# Patient Record
Sex: Female | Born: 1970 | Race: White | Hispanic: No | Smoking: Former smoker
Health system: Southern US, Community
[De-identification: ages and names within clinical notes are randomized; demographics above are authoritative.]

## PROBLEM LIST (undated history)

## (undated) DIAGNOSIS — G8929 Other chronic pain: Secondary | ICD-10-CM

## (undated) DIAGNOSIS — M1990 Unspecified osteoarthritis, unspecified site: Secondary | ICD-10-CM

## (undated) DIAGNOSIS — K259 Gastric ulcer, unspecified as acute or chronic, without hemorrhage or perforation: Secondary | ICD-10-CM

## (undated) DIAGNOSIS — I480 Paroxysmal atrial fibrillation: Secondary | ICD-10-CM

## (undated) DIAGNOSIS — D649 Anemia, unspecified: Secondary | ICD-10-CM

## (undated) DIAGNOSIS — M549 Dorsalgia, unspecified: Secondary | ICD-10-CM

## (undated) HISTORY — DX: Anemia, unspecified: D64.9

## (undated) HISTORY — PX: TUBAL LIGATION: SHX77

## (undated) HISTORY — DX: Paroxysmal atrial fibrillation: I48.0

## (undated) HISTORY — PX: REPLACEMENT TOTAL KNEE BILATERAL: SUR1225

## (undated) HISTORY — PX: CHOLECYSTECTOMY: SHX55

---

## 2003-12-23 ENCOUNTER — Emergency Department (HOSPITAL_COMMUNITY): Admission: EM | Admit: 2003-12-23 | Discharge: 2003-12-23 | Payer: Self-pay | Admitting: Emergency Medicine

## 2003-12-23 ENCOUNTER — Inpatient Hospital Stay (HOSPITAL_COMMUNITY): Admission: EM | Admit: 2003-12-23 | Discharge: 2003-12-25 | Payer: Self-pay | Admitting: Psychiatry

## 2005-10-01 ENCOUNTER — Ambulatory Visit: Payer: Self-pay | Admitting: Family Medicine

## 2005-10-17 ENCOUNTER — Emergency Department (HOSPITAL_COMMUNITY): Admission: EM | Admit: 2005-10-17 | Discharge: 2005-10-17 | Payer: Self-pay | Admitting: Emergency Medicine

## 2005-10-29 ENCOUNTER — Ambulatory Visit: Payer: Self-pay | Admitting: Family Medicine

## 2006-06-04 ENCOUNTER — Ambulatory Visit: Payer: Self-pay | Admitting: Family Medicine

## 2006-07-14 ENCOUNTER — Inpatient Hospital Stay (HOSPITAL_COMMUNITY): Admission: RE | Admit: 2006-07-14 | Discharge: 2006-07-17 | Payer: Self-pay | Admitting: Orthopedic Surgery

## 2006-08-18 ENCOUNTER — Encounter: Admission: RE | Admit: 2006-08-18 | Discharge: 2006-08-18 | Payer: Self-pay | Admitting: Physician Assistant

## 2006-10-17 ENCOUNTER — Ambulatory Visit: Payer: Self-pay | Admitting: Family Medicine

## 2006-11-05 ENCOUNTER — Emergency Department (HOSPITAL_COMMUNITY): Admission: EM | Admit: 2006-11-05 | Discharge: 2006-11-05 | Payer: Self-pay | Admitting: Emergency Medicine

## 2006-11-24 ENCOUNTER — Emergency Department (HOSPITAL_COMMUNITY): Admission: EM | Admit: 2006-11-24 | Discharge: 2006-11-24 | Payer: Self-pay | Admitting: Emergency Medicine

## 2006-11-27 ENCOUNTER — Ambulatory Visit: Payer: Self-pay | Admitting: Family Medicine

## 2006-12-15 ENCOUNTER — Inpatient Hospital Stay (HOSPITAL_COMMUNITY): Admission: RE | Admit: 2006-12-15 | Discharge: 2006-12-18 | Payer: Self-pay | Admitting: Orthopedic Surgery

## 2006-12-17 ENCOUNTER — Ambulatory Visit: Payer: Self-pay | Admitting: Vascular Surgery

## 2006-12-17 ENCOUNTER — Encounter: Payer: Self-pay | Admitting: Vascular Surgery

## 2007-01-16 ENCOUNTER — Ambulatory Visit: Payer: Self-pay | Admitting: Family Medicine

## 2007-02-13 ENCOUNTER — Ambulatory Visit: Payer: Self-pay | Admitting: Family Medicine

## 2007-02-21 ENCOUNTER — Emergency Department (HOSPITAL_COMMUNITY): Admission: EM | Admit: 2007-02-21 | Discharge: 2007-02-21 | Payer: Self-pay | Admitting: Emergency Medicine

## 2007-03-17 ENCOUNTER — Emergency Department (HOSPITAL_COMMUNITY): Admission: EM | Admit: 2007-03-17 | Discharge: 2007-03-17 | Payer: Self-pay | Admitting: Emergency Medicine

## 2007-03-20 ENCOUNTER — Emergency Department (HOSPITAL_COMMUNITY): Admission: EM | Admit: 2007-03-20 | Discharge: 2007-03-20 | Payer: Self-pay | Admitting: Emergency Medicine

## 2007-03-29 ENCOUNTER — Emergency Department (HOSPITAL_COMMUNITY): Admission: EM | Admit: 2007-03-29 | Discharge: 2007-03-29 | Payer: Self-pay | Admitting: Emergency Medicine

## 2007-07-16 ENCOUNTER — Ambulatory Visit (HOSPITAL_COMMUNITY): Admission: RE | Admit: 2007-07-16 | Discharge: 2007-07-16 | Payer: Self-pay | Admitting: Orthopaedic Surgery

## 2008-01-21 ENCOUNTER — Inpatient Hospital Stay (HOSPITAL_COMMUNITY): Admission: EM | Admit: 2008-01-21 | Discharge: 2008-01-24 | Payer: Self-pay | Admitting: Emergency Medicine

## 2008-02-28 ENCOUNTER — Emergency Department (HOSPITAL_COMMUNITY): Admission: EM | Admit: 2008-02-28 | Discharge: 2008-02-28 | Payer: Self-pay | Admitting: Emergency Medicine

## 2008-11-14 IMAGING — CR DG KNEE COMPLETE 4+V*R*
4 series · 4 of 4 positions shown · non-contrast
Comparison: 11/05/2006

CLINICAL DATA: Status post fall

RIGHT KNEE - COMPLETE 4+ VIEW

[view not recorded (1 of 4)]
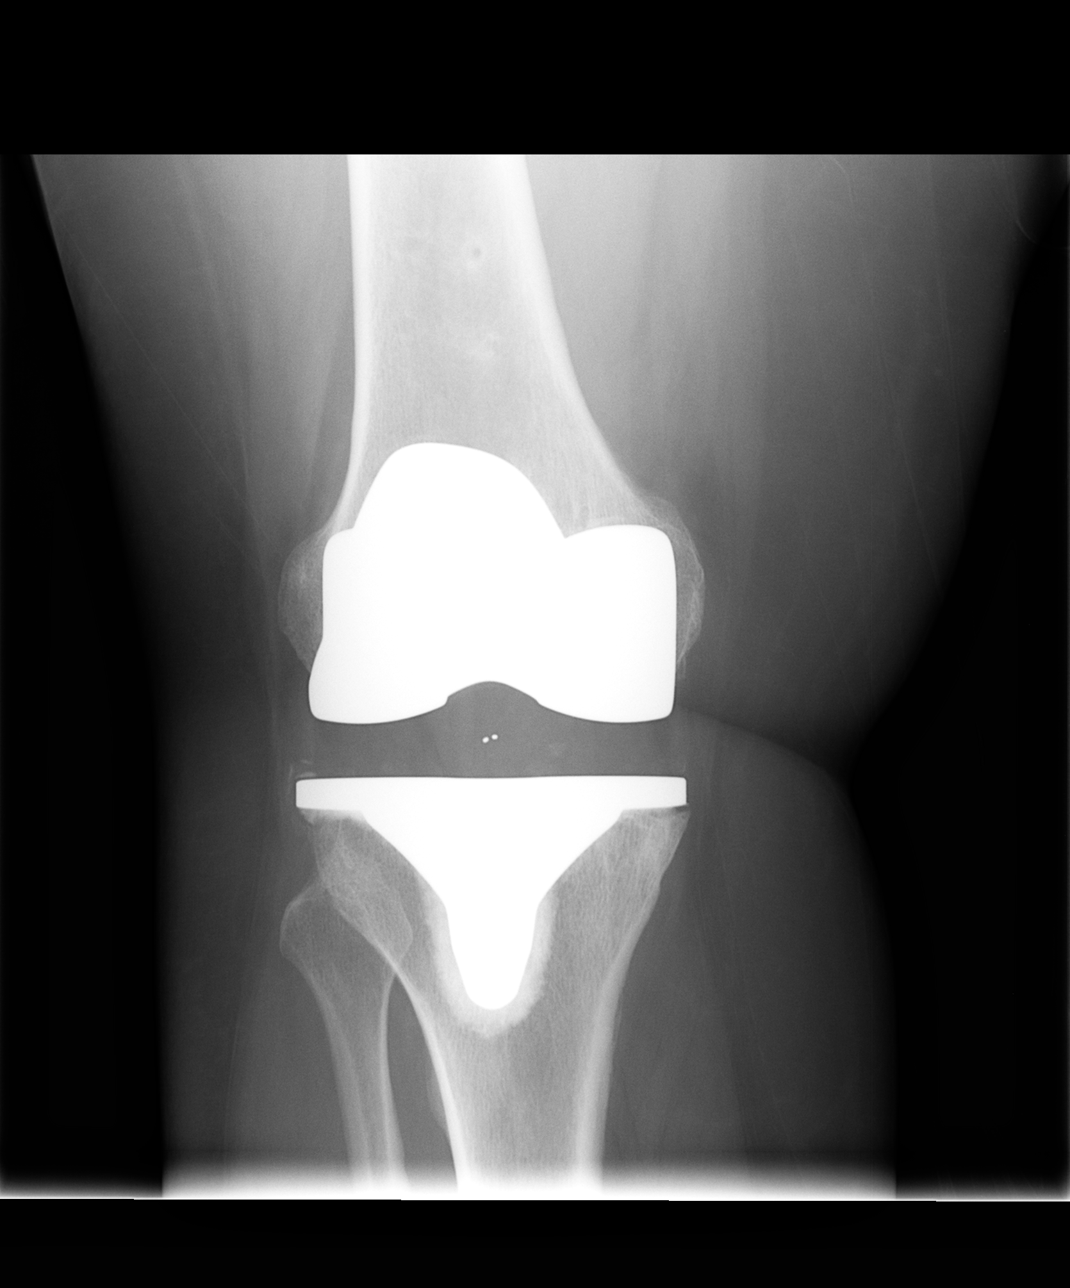

[view not recorded (2 of 4)]
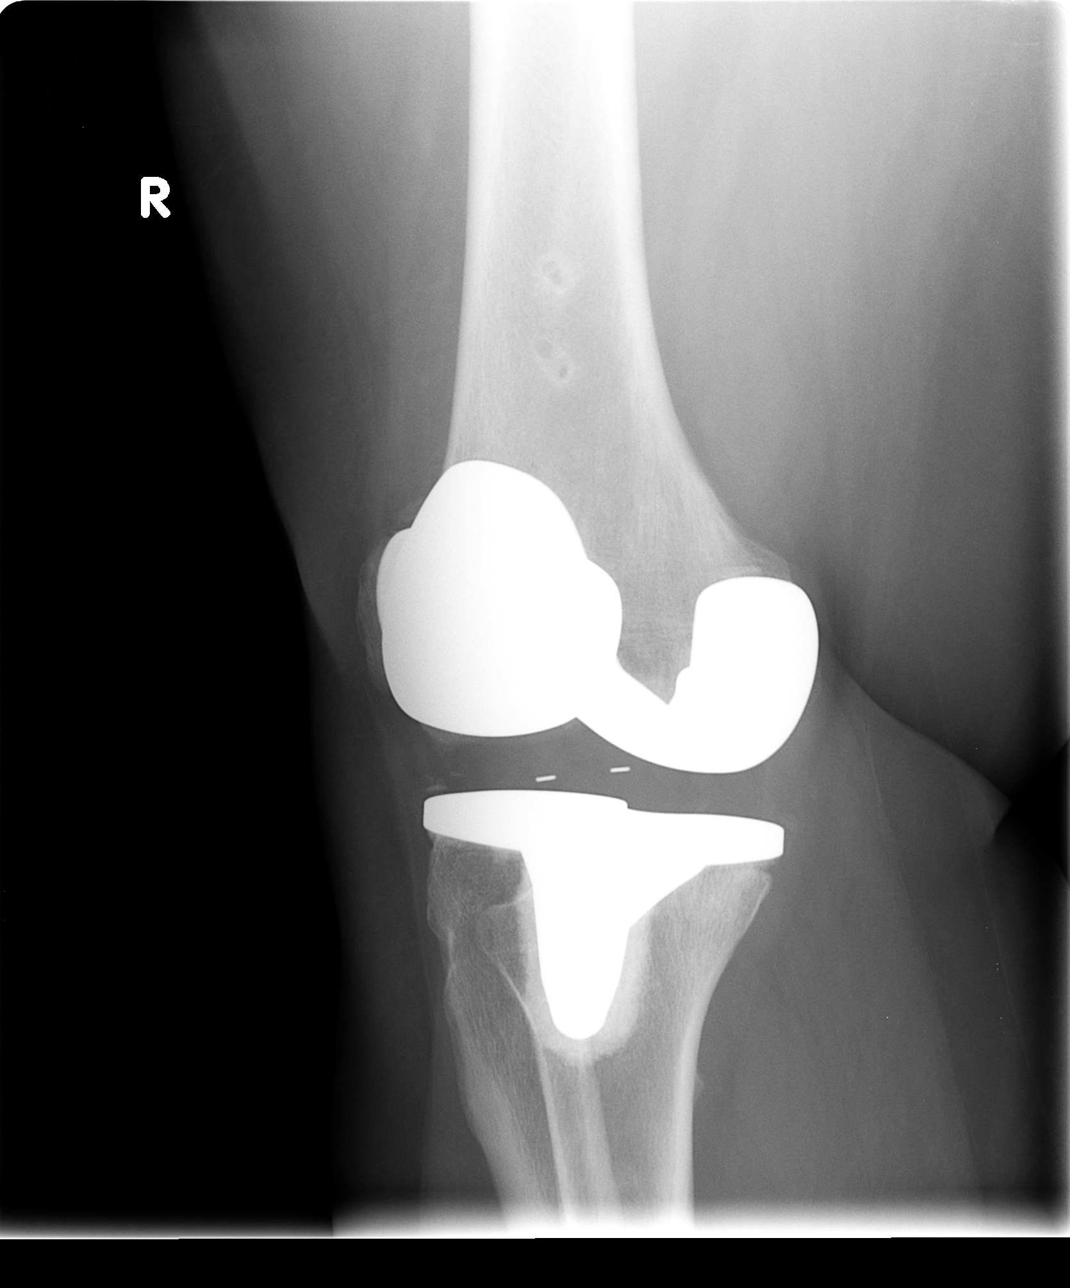

[view not recorded (3 of 4)]
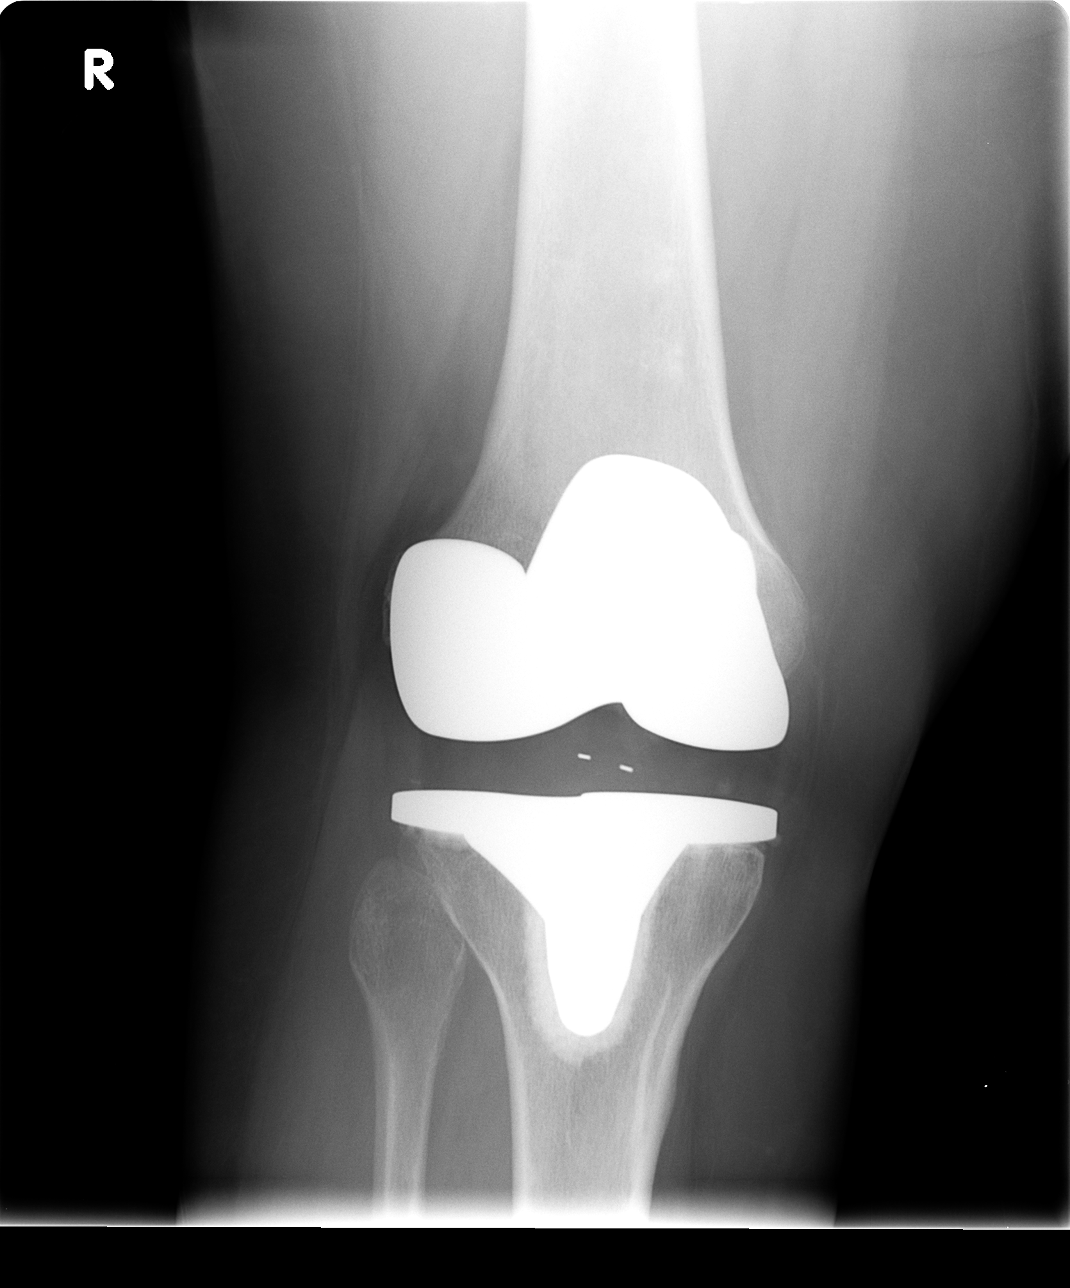

[view not recorded (4 of 4)]
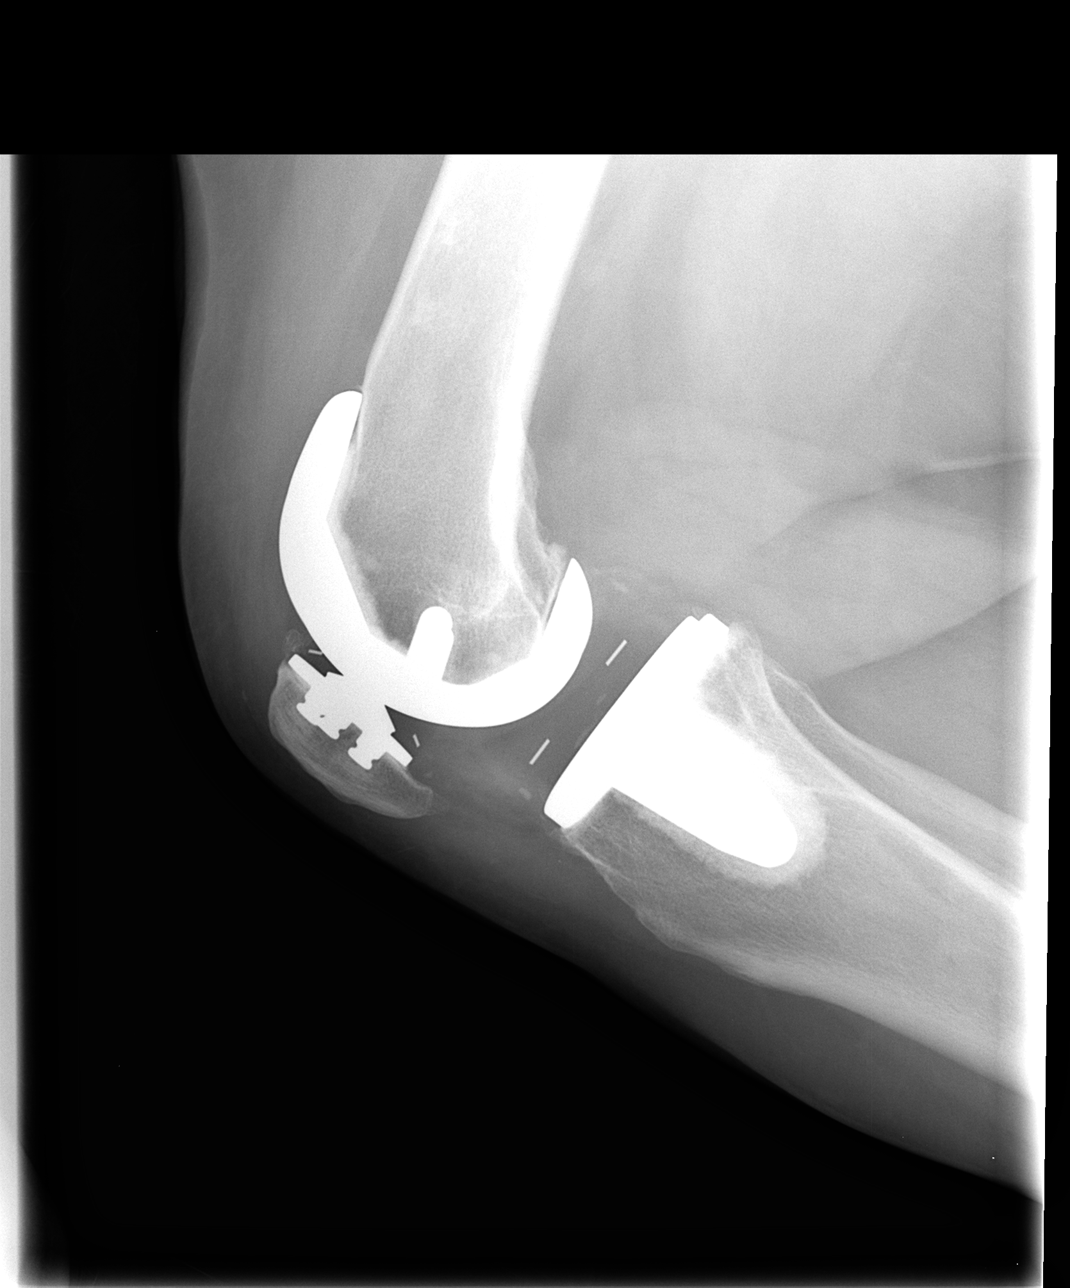

[4 of 4 positions shown; findings below may reference images not displayed]

FINDINGS: Patient has undergone previous total knee replacement.
The prosthesis is stable.  There is no evidence for fracture.  No
joint effusion is identified.
IMPRESSION: Stable.

## 2009-04-26 ENCOUNTER — Emergency Department (HOSPITAL_COMMUNITY): Admission: EM | Admit: 2009-04-26 | Discharge: 2009-04-26 | Payer: Self-pay | Admitting: Emergency Medicine

## 2009-06-28 ENCOUNTER — Emergency Department (HOSPITAL_COMMUNITY): Admission: EM | Admit: 2009-06-28 | Discharge: 2009-06-28 | Payer: Self-pay | Admitting: Emergency Medicine

## 2009-07-31 ENCOUNTER — Emergency Department (HOSPITAL_COMMUNITY): Admission: EM | Admit: 2009-07-31 | Discharge: 2009-07-31 | Payer: Self-pay | Admitting: Emergency Medicine

## 2009-10-04 ENCOUNTER — Emergency Department (HOSPITAL_COMMUNITY): Admission: EM | Admit: 2009-10-04 | Discharge: 2009-10-04 | Payer: Self-pay | Admitting: Emergency Medicine

## 2010-01-31 ENCOUNTER — Emergency Department (HOSPITAL_COMMUNITY): Admission: EM | Admit: 2010-01-31 | Discharge: 2010-01-31 | Payer: Self-pay | Admitting: Emergency Medicine

## 2010-05-12 ENCOUNTER — Emergency Department (HOSPITAL_COMMUNITY): Admission: EM | Admit: 2010-05-12 | Discharge: 2010-05-13 | Payer: Self-pay | Admitting: Emergency Medicine

## 2010-09-20 ENCOUNTER — Emergency Department (HOSPITAL_COMMUNITY)
Admission: EM | Admit: 2010-09-20 | Discharge: 2010-09-20 | Payer: Self-pay | Source: Home / Self Care | Admitting: Emergency Medicine

## 2010-12-08 LAB — DIFFERENTIAL
Basophils Absolute: 0.1 10*3/uL (ref 0.0–0.1)
Lymphocytes Relative: 29 % (ref 12–46)
Lymphs Abs: 2.1 10*3/uL (ref 0.7–4.0)
Monocytes Relative: 4 % (ref 3–12)
Neutrophils Relative %: 65 % (ref 43–77)

## 2010-12-08 LAB — PREGNANCY, URINE: Preg Test, Ur: NEGATIVE

## 2010-12-08 LAB — COMPREHENSIVE METABOLIC PANEL
ALT: 20 U/L (ref 0–35)
Albumin: 3.4 g/dL — ABNORMAL LOW (ref 3.5–5.2)
Alkaline Phosphatase: 81 U/L (ref 39–117)
Calcium: 9.3 mg/dL (ref 8.4–10.5)
Chloride: 108 mEq/L (ref 96–112)
Creatinine, Ser: 0.74 mg/dL (ref 0.4–1.2)

## 2010-12-08 LAB — CBC
MCHC: 33.8 g/dL (ref 30.0–36.0)
MCV: 89.9 fL (ref 78.0–100.0)
RBC: 3.57 MIL/uL — ABNORMAL LOW (ref 3.87–5.11)
RDW: 16.7 % — ABNORMAL HIGH (ref 11.5–15.5)

## 2010-12-08 LAB — URINALYSIS, ROUTINE W REFLEX MICROSCOPIC
Bilirubin Urine: NEGATIVE
Hgb urine dipstick: NEGATIVE
Ketones, ur: NEGATIVE mg/dL
Specific Gravity, Urine: 1.02 (ref 1.005–1.030)
Urobilinogen, UA: 0.2 mg/dL (ref 0.0–1.0)

## 2011-01-15 NOTE — H&P (Signed)
NAME:  MILTON, STREICHER NO.:  [id number]   MEDICAL RECORD NO.:  [id number]          PATIENT TYPE:  EMS   LOCATION:  ED                            FACILITY:  [facility]   PHYSICIAN:  Osvaldo Shipper, MD     DATE OF BIRTH:  1971/07/21   DATE OF ADMISSION:  01/21/2008  DATE OF DISCHARGE:  LH                              HISTORY & PHYSICAL   PRIMARY MEDICAL DOCTOR:  Dr. Loney Hering, [facility], [place].   ADMISSION DIAGNOSES:  1. Acute pyelonephritis right side.  2. Obesity.  3. History of depression.  4. History of arthritis.   CHIEF COMPLAINT:  Right-sided flank pain.   HISTORY OF PRESENT ILLNESS:  The patient is a 40 year old Caucasian  female who has no significant past medical history, who was in a usual  state of health till about five  days ago when she started experiencing  pain in the right flank area.  The pain was 5/10 in intensity.  These  symptoms progressively worsened and today she started experiencing  severe pain which was 10/10 intensity.  The pain started radiating  towards the groin.  She denies any history of kidney stones.  Along with  these symptoms, she also started having chills and sweating.  She also  had nausea and vomiting.  No diarrhea.  She had dysuria and yesterday  she had a few episodes of urinary incontinence.  She has had what she  says was a kidney infection about 3-4 years ago but does not have these  issues on consistent basis.   MEDICATIONS AT HOME:  She is on  1. Lexapro 10 mg daily.  2. Voltaren 75 mg twice a day.  3. Vicodin 7.5/750 as needed.   ALLERGIES:  1. DARVOCET.  2. PENICILLIN.  3. TORADOL.   PAST MEDICAL HISTORY:  Positive for  1. Arthritis.  2. Bilateral total knee replacements.  3. Cholecystectomy.  4. Back pain.   SOCIAL HISTORY:  [place].  Not working at this time.  Smokes half pack of cigarettes on a daily  basis.  Denies any alcohol use or illicit drug use.   FAMILY HISTORY:  Noncontributory.   REVIEW OF SYSTEMS:  GENERAL:  System positive for weakness.  HEENT:  Unremarkable.  CARDIOVASCULAR:  Unremarkable.  RESPIRATORY:  Unremarkable.  GI: As in HPI.  GU: As in HPI.  NEUROLOGIC:  Unremarkable.  PSYCHIATRIC:  Unremarkable.  Other systems unremarkable.   PHYSICAL EXAMINATION:  VITAL SIGNS:  When she presented, her temperature  was 102.3, improving to 100.7; blood pressure was 95/57 with heart rate  of 104, improving to 101/33 with heart rate of 80; respiratory rate 22,  saturation 98% on room air.  GENERAL:  This is an obese white female who appears sick but in no  distress.  HEENT: There is no pallor, no icterus.  Mucous membranes slightly dry.  No oral lesions are noted.  NECK:  Soft and supple.  No thyromegaly is appreciated.  LUNGS:  Clear to auscultation bilaterally.  No wheezing, rales or  rhonchi.  CARDIOVASCULAR:  S1 and S2 normal regular.  ABDOMEN:  Soft.  There is right CVA tenderness, pain also in the lower  abdomen area on the right side.  No rebound, rigidity or guarding is  present.  EXTREMITIES:  No edema.  Her pulses are palpable.  NEUROLOGIC:  She is alert and oriented x3.  No focal neurological  deficits are present.   LABORATORY DATA:  Hemoglobin 8.5, which is pretty close to her baseline,  MCV 82, platelet count 219 and white count is 7.9 with neutrophil 84%.  Her urine pregnancy was negative.  Her UA was positive for nitrite,  leukocytes, innumerable wbc, many bacteria, small blood.  She also had a  BMET which is not reported yet.   No imaging studies have been done.   ASSESSMENT:  This is a 40 year old Caucasian female who is obese,  presents with four-day history of right flank pain.  She has evidence  for possibly a right pyelonephritis.  She is also dehydrated with early  signs of systemic inflammatory response syndrome.   PLAN:  1. Acute pyelonephritis.  Will treat with Rocephin 1 gram daily.  We      will  await urine culture report.  Follow up on the results of the      BMET.  Antibiotics will be changed if there is no improvement and      once we get the report from the cultures.  Consideration to imaging      studies will be given if patient does not improve.  2. Anemia.  This appears to be chronic.  Hemoglobin has been between 8      and 9 for the last at least couple of years.  The reason for this      is not very clear.  No bleeding is noted.  The patient to follow-up      with PMD for further workup of her anemia.   Her musculoskeletal issues are all stable.  Anticipate the patient being  discharged in a couple of days.      Osvaldo Shipper, MD  Electronically Signed     GK/MEDQ  D:  01/21/2008  T:  01/21/2008  Job:  [id number]

## 2011-01-15 NOTE — Discharge Summary (Signed)
NAME:  [name], [name]              ACCOUNT NO.:  [id number]   MEDICAL RECORD NO.:  [id number]          PATIENT TYPE:  INP   LOCATION:  1506                         FACILITY:  [facility]   PHYSICIAN:  John L. Rendall, M.D.  DATE OF BIRTH:  Aug 26, 1971   DATE OF ADMISSION:  12/15/2006  DATE OF DISCHARGE:  12/18/2006                               DISCHARGE SUMMARY   ADMISSION DIAGNOSES:  1. Osteoarthritis, left knee.  2. 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  3. Depression.   DISCHARGE DIAGNOSES:  1. Status post left total knee arthroplasty.  2. Acute blood loss anemia secondary to surgery requiring blood      transfusion.  3. Hypokalemia, resolved.  4. 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  5. Depression.  6. Urinary tract infection, treated.   HISTORY OF PRESENT ILLNESS:  [name] is a 40 year old white female  with left knee pain since 1999.  No injury.  History of left knee  arthroscopy in 2007.  History of right total knee arthroplasty November  2007.  The right knee is doing well.  Left knee pain with diffuse,  constant pain, mechanical symptoms, positive for giving away.  No  assistive devices.  Positive waking pain.  X-rays preoperatively showed  left knee with bone on bone in the medial compartment.  The patient  admitted to undergo a left total knee arthroplasty.   ALLERGIES:  PENICILLIN causes stomach upset.   MEDICATIONS:  1. Lexapro 10 mg one daily.  2. Tylenol No. 3 one daily.  3. Diclofenac 75 mg b.i.d., stopped December 02, 2006.   SURGICAL PROCEDURE:  The patient was taken to the operating room on  December 15, 2006, by John L. Rendall, M.D., assisted by Legrand Pitts. Duffy,  P.A.-C.  The patient was placed under general anesthesia and then the  patient underwent a left total knee arthroplasty with the assistance of  computer navigation.  The following components were used:  A standard  plus left femoral component, a size 4 tibial tray,  a 20 mm bearing and a  standard plus patella.   The patient tolerated the procedure well and returned to the recovery  room in good, stable condition.   CONSULTATIONS:  The following consults were obtained while the patient  was hospitalized:  PT, OT, case management.   HOSPITAL COURSE:  Postop day #1, patient with poor pain control.  Complained of posterior calf pain on the left.  No chest pain, shortness  of breath.  Tolerating diet.  Difficulty sleeping.  H&H 8.6 and 26.1.  The patient was found to be hypokalemic.  The patient was transfused 1  unit of packed red blood cells and hypokalemia was treated.  E. coli in  preoperative urinalysis, and this was treated with Septra.  Postop day  #2, patient with poor pain control.  No shortness of breath, nausea or  vomiting or chest pain.  Tolerating a diet.  H&H 8.2 and 24.9.  Potassium was 4.4.  The patient was afebrile, vital signs stable.  Patient with continued calf pain, possible Homans, Doppler was ordered.  The patient was given additional one unit of packed red blood cells.  Postop day #3, patient awake, alert, pain under better control with  increased OxyContin.  Denied chest pain, shortness of breath, nausea or  vomiting.  Tolerating a diet well.  The patient ambulating to bathroom  on her own.  The patient afebrile, vital signs stable.  Hemoglobin was  8.9, 26.7.  It was felt to be lower than one would expect with the  additional unit of packed red blood cells; therefore, a repeat H&H was  ordered.  Doppler of the left lower leg was negative for DVT.  Repeat  H&H revealed hemoglobin to be 9.0, hematocrit 27.4.  The patient was  otherwise stable and was discharged later that day in good, stable  condition once medications were approved.   LABORATORY DATA:  Routine labs on admission:  CBC:  White count was  6500, hemoglobin 9.5, low, hematocrit was 28.6, low, and platelets 253.  Routine chemistries on admission:  Sodium 138,  potassium 3.9, chloride  108, bicarb 21, glucose was slightly elevated at 102 mg/dl, BUN was 17,  creatinine was 0.60.  Hepatic enzymes on admission:  All values within  normal limits.  Urinalysis on admission showed few epithelial cells,  hyaline casts.  Culture revealed E. coli at 25,000 colonies per  milliliter, susceptible to Septra.   DISCHARGE INSTRUCTIONS:   MEDICATIONS:  1. Arixtra 2.5 mg one injection subcutaneous each morning at 8 a.m.,      last dose on December 21, 2006.  On April 21, patient to start one      baby aspirin x1 month.  2. Percocet 5/325 mg one to two tablets every 4-6 hours for      breakthrough pain.  3. OxyContin 20 mg Sustained Release one tablet q.12h. for pain.  4. Robaxin 500 mg one tablet every 6-8 hours for spasm.  5. Celebrex 200 mg one tablet b.i.d.  6. Lexapro 10 mg one tablet daily.   DIET:  No restrictions.   ACTIVITY:  The patient is weightbearing as tolerated to the left leg  with a walker.   WOUND CARE:  Patient to keep wound clean and dry, change dressing daily.  Call our office if any signs of infection.   SPECIAL INSTRUCTIONS:  CPM 0-90 degrees 6-8 hours a day, increase by 10  degrees daily.   FOLLOW-UP:  The patient is to follow up with Dr. Priscille Kluver on Tuesday,  December 30, 2006.  The patient is to call the office at [phone] for  appointment.   Home health PT per [facility].   CONDITION ON DISCHARGE:  The patient was discharged to home in good,  stable condition.      Richardean Canal, P.A.      John L. Rendall, M.D.  Electronically Signed    GC/MEDQ  D:  02/04/2007  T:  02/04/2007  Job:  [id number]

## 2011-01-15 NOTE — Discharge Summary (Signed)
NAME:  [name], [name] NO.:  [id number]   MEDICAL RECORD NO.:  [id number]          PATIENT TYPE:  INP   LOCATION:  A306                          FACILITY:  [facility]   PHYSICIAN:  Osvaldo Shipper, MD     DATE OF BIRTH:  1971/07/02   DATE OF ADMISSION:  01/21/2008  DATE OF DISCHARGE:  05/24/2009LH                               DISCHARGE SUMMARY   PRIMARY MEDICAL DOCTOR:  Dr. Loney Hering from [facility], [place].   DISCHARGE DIAGNOSES:  1. Acute right pyelonephritis, improved.  2. Obesity.  3. Arthritis.  4. Depression.  5. Anemia.  6. Abnormal liver function tests.   Please see H&P dictated at the time of admission for details regarding  the patient's presenting illness.   BRIEF HOSPITAL COURSE:  Briefly, this is a 40 year old Caucasian female  who does not have any significant medical problems who presented with  complaints of right flank pain and dizziness.  The patient was found to  have evidence for UTI.  She was hypotensive and dehydrated in the ED.  The patient failed management in the ED and was admitted for further  treatment.  The patient was put on ceftriaxone.  Her urine cultures grew  E. coli though sensitivities are not available yet.  Previous urine  cultures have grown E. coli as well which was sensitive to first-  generation cephalosporin.  Blood cultures have been negative.  Over the  past couple of days, her fevers have improved.  Blood pressure has  improved with aggressive IV hydration.  Her symptoms have  improved as  well, though she still has minimal right-sided pain.  Her fevers have  also resolved.   She was also found to have anemia when she was admitted with a  hemoglobin of 8.5.  Her baseline is around 8.5.  Her hemoglobin did drop  to 7.8 with aggressive fluid management.  The drop was thought to be  secondary to hemodilution.  She was transfused 2 units of blood and her  hemoglobin came up to 9.2.  No active bleeding was noted.  The  patient  was asked to follow up with her PMD for further workup of her anemia.  She was also mildly hypokalemic which also improved.  She was also found  to have elevated liver enzymes with AST more than ALT.  She did consume  some alcohol.  She was counseled on alcohol intake.  Ultrasound of the  abdomen was also done which did not reveal any discrete liver  abnormalities.  The patient was asked to follow up with her PMD for the  same.  Hepatitis profile was also done which was negative.   So on the day of discharge, she is feeling much better.  She is  tolerating p.o. intake.  She still had some minimal pain in the right  flank but is much improved.  Vital signs are all stable, blood pressure  is 119/78, saturation 97%, heart rate is 67, she is afebrile.  Examination otherwise reveals mild right-sided CVA tenderness, much  improved.  The rest of the examination is benign. She is  stable for  discharge.   DISCHARGE MEDICATIONS:  1. Keflex 500 mg p.o. b.i.d. for 6 days.  2. Oxycodone 5 mg every 4-6 hours as needed for pain.  She was told      not to take her Vicodin while she was on the oxycodone.  She will      resume Vicodin once she finishes with the oxycodone.  3. Vicodin 7.5/750 as instructed.  4. Voltaren 75 mg b.i.d.  5. Lexapro 10 mg b.i.d.  6. Xanax as needed.   FOLLOW-UP:  With Dr. Loney Hering in 1-2 weeks to discuss anemia and her  abnormal liver tests.   PHYSICAL ACTIVITY:  No restrictions.   DIET:  No restrictions.   No consultations obtained during this admission.  Ultrasound report  discussed earlier.  It also showed minimal right hydronephrosis.  Mild  splenomegaly was also noted.   TOTAL TIME ON THIS DISCHARGE:  35 minutes      Osvaldo Shipper, MD  Electronically Signed     GK/MEDQ  D:  01/24/2008  T:  01/24/2008  Job:  [id number]   cc:   Ernestine Conrad, MD  [place], [place]

## 2011-01-18 NOTE — H&P (Signed)
NAME:  [name], [name] NO.:  [id number]   MEDICAL RECORD NO.:  [id number]         PATIENT TYPE:  LINP   LOCATION:                               FACILITY:  [facility]   PHYSICIAN:  John L. Rendall, M.D.  DATE OF BIRTH:  1971-05-02   DATE OF ADMISSION:  12/15/2006  DATE OF DISCHARGE:                              HISTORY & PHYSICAL   CHIEF COMPLAINTS:  Left knee pain.   HISTORY OF PRESENT ILLNESS:  [name] is a 40 year old white female  with left knee pain since 1999.  No injury.  History of left knee  arthroscopy in 2007.  History of right total knee arthroplasty November  2007.  The right knee is doing well.  Left knee pain is a diffuse,  constant pain.  Mechanical symptoms positive for giving away.  No  assistive devices.  Positive waking pain.  X-rays show bone on bone in  the medial compartment of the left knee.   ALLERGIES:  PENICILLIN causes stomach upset.   MEDICATIONS:  1. Lexapro 10 mg one daily.  2. Tylenol No 3 one daily.  3. Diclofenac 75 mg b.i.d., stopped December 02, 2006.   PAST SURGICAL HISTORY:  1. Right total knee replacement July 14, 2006 by Dr. Priscille Kluver.  2. Right knee arthroscopy March 2007, Dr. Priscille Kluver.  3. Left knee arthroscopy June 2007, Dr. Priscille Kluver.  4. Cholecystectomy 2002.   The patient denies any complications from the procedures.  No blood  transfusions.   SOCIAL HISTORY:  The patient smokes half a pack of cigarettes daily and  has done so for 20 years.  She denies any alcohol use.  She lives in a  [place] home with a ramp to the main entrance.   FAMILY HISTORY:  The patient's mother is alive at age 78, has diabetes.  Father, age 48, healthy.  She has two living brothers, one age 75 with  diabetes.  The other is 49, reported to be healthy.   REVIEW OF SYSTEMS:  Depression.  History of acute blood loss anemia  after right total knee in November 2007, not requiring any blood  transfusions.  Otherwise the patient's review of  systems negative or  noncontributory.   PHYSICAL EXAMINATION:  GENERAL:  The patient is a well-developed, well-  nourished female.  She walks with an antalgic gait and limp.  The mood  is appropriate.  She talks easily with examiner.  VITAL SIGNS:  The patient's height is 5 feet 9 inches, weight 240  pounds.  Temperature is 99.4, pulse 70, respiratory rate 16, blood  pressure 112/68.  CARDIAC:  Regular rate and rhythm.  No murmurs, rubs or gallops noted.  LUNGS:  Clear to auscultation bilaterally.  No wheezing, rhonchi or  rales noted.  ABDOMEN:  Obese, nontender with positive bowel sounds x4 quadrants.  HEENT: Head is normocephalic, atraumatic without frontal maxillary sinus  tenderness.  PERRLA.  Conjunctivae pink.  Sclerae is nonicteric.  EOMs  are intact.  No visible external ear deformities noted.  TMs pearly gray  bilaterally.  Nose: Nasal septum midline and nasal mucosa is pink,  moist  without polyps.  Buccal mucosa is pink and moist.  The patient has good  dental repair.  She has had recent extraction of the back two molars on  the upper jawline on the right.  Tongue and uvula are midline.  Pharynx  is without erythema or exudate.  BREASTS/GENITOURINARY/RECTAL:  All deferred at this time.  NECK:  Trachea is midline.  No lymphadenopathy.  The patient has full  range of motion cervical spine and no tenderness with palpation along  the cervical spine.  Carotids are 2+ bilaterally without bruits.  NEUROLOGIC:  Patient is alert and oriented x3.  Cranial nerves II-XII  grossly intact.  Lower extremity strength testing reveals 5/5 strength  throughout.  MUSCULOSKELETAL:  Upper extremities:  She has full range of motion of  the upper extremities. Upper extremities symmetric in size and shape.  Lower extremities:  She has full range of motion of both hips without  pain.  Right knee:  She has well-healed surgical incision in the  midline.  No effusion.  No edema.  She has 0-110 degrees  of flexion.  Valgus and varus stressing reveals no laxity.  Calves supple. Left knee:  There is 115 degrees of flexion.  Diffuse tenderness.  Shows  approximately 5 degrees varus deformity.  Mild crepitus on passive range  of motion.  No instability.  No effusion.  Calves supple, nontender.  Dorsal pedal pulses are 2+ bilaterally and symmetric.  She has good  sensation, light touch __________ throughout.   IMPRESSION:  1. Osteoarthritis of the left knee, bone on bone medial compartment.  2. Depression.  3. 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   PLAN:  The patient to be admitted to [facility] on 12[facility]15/2006  to undergo a left total knee arthroplasty with computer navigation.  Prior to surgery the patient did receive preoperative clearance from Dr.  Joette Catching of [place], [place].      Richardean Canal, P.A.      John L. Rendall, M.D.  Electronically Signed    GC/MEDQ  D:  12/10/2006  T:  12/11/2006  Job:  [id number]

## 2011-01-18 NOTE — Discharge Summary (Signed)
NAME:  [name], [name]              ACCOUNT NO.:  [id number]   MEDICAL RECORD NO.:  [id number]          PATIENT TYPE:  INP   LOCATION:  1513                         FACILITY:  [facility]   PHYSICIAN:  John L. Rendall, M.D.  DATE OF BIRTH:  1971-05-15   DATE OF ADMISSION:  07/14/2006  DATE OF DISCHARGE:  07/17/2006                                 DISCHARGE SUMMARY   ADMISSION DIAGNOSIS:  Osteoarthritis of the right knee.   DISCHARGE DIAGNOSES:  1. Osteoarthritis of the right knee.  2. Acute blood loss anemia.  3. Depression.   PROCEDURE:  Right total knee arthroplasty.   HISTORY:  This is a 40 year old white female with persistent severe right  knee pain.  She has had an arthroscopy of the right knee in March of 2007.  She is now taking narcotics to control her pain.  The pain is sharp and  throbbing and worsens with activity.  Rest is minimally helpful.  Radiographic endstage DJD.  She has failed conservative treatment.  Indicated now for total knee arthroplasty.   HOSPITAL COURSE:  A 40 year old white female admitted July 14, 2006  after appropriate laboratory studies were obtained as well as 1 gram Ancef  IV on call to the OR.  The patient was taken to the operating room where she  had a right total knee arthroplasty by Dr. Jonny Ruiz L. Rendall with the  assistance of Oris Drone. Petrarca, P.A.-C.  She tolerated the procedure well.  Foley was placed intraoperatively.  She was placed on a hydromorphone PCA  pump and this was increased to full doses.  CPM was placed 0-90 degrees for  6-8 hours per day.  Consultations with PT, OT and care management were made.  Ambulation weightbearing as tolerated on the right.  She was allowed out of  bed to a chair the following day.  She unfortunately continued to have  rather significant pain.  We then changed her pain medication.  She was then  started on OxyContin 20 mg p.o. q.12h., Celebrex 200 mg p.o. b.i.d. for 3  days, Percocet 5/325 1-2  tablets every 4 hours as needed for pain, and she  continued with her Robaxin 500 mg 1 p.o. q.6h.  Arixtra teaching was begun.  The following day, she had marked resolution in her pain and was more  comfortable.  Her Hemovac was pulled postoperative day #2.  The remainder of  the hospital course was uneventful and dedicated to ambulation.  She was  discharged in improved condition.   There were no radiographic or cardiology studies on the chart.   LABORATORY DATA:  Admitted with a hemoglobin of 12.4, hematocrit 37.0%,  white count 6800, platelets 296,000.  Discharge hemoglobin 8.4, hematocrit  23.9, white count 6200, platelets 182,000.  Preoperative chemistries:  Sodium 139, potassium 3.9, chloride 106, CO2 25, glucose 92, BUN 8,  creatinine 0.5, calcium 9.2, total protein 7.2, albumin 4.0, AST 22, ALT 23,  alkaline phosphatase 96, total bilirubin 0.8.  Discharge sodium 139,  potassium 3.7, chloride 108, CO2 26, BUN 5, creatinine 0.5, glucose 89.  Urine pregnancy test was negative.  A drug screen after induction of  anesthesia revealed only positive benzodiazepines.  This was consistent with  the Versed that was given preoperatively.  Urinalysis July 11, 2006 and  July 14, 2006 were benign.  Urine culture of July 11, 2006 revealed  40,000 colonies per ml which was multi bacterial morphotype blood prominent.   DISCHARGE INSTRUCTIONS:  There is no restriction of her diet.  She may  ambulate weightbearing as tolerated.  No driving or lifting for six weeks.  Increase her activity slowly.  She may shower on Friday.  She was to follow  wound instruction sheet.  Change the dressing daily.   PRESCRIPTIONS:  1. OxyContin 20 mg 1 tablet every 12 hours.  2. Percocet 5/325 1-2 tablets every 4 hours as needed for pain.  3. Celebrex 200 mg 1 p.o. daily with food.  4. Arixtra 2.5 mg inject daily at 9:00 p.m. as instructed with the last      dose on July 21, 2006.  5. She will begin  aspirin 81 mg on July 22, 2006 once daily for six      weeks.  6. Robaxin 500 mg 1 tablet every 6 hours as needed for spasm.   CPM 0-90 degrees for 6-8 hours a day.  Genevieve Norlander for her home health physical  therapy.  She will follow back up with Dr. Priscille Kluver on July 29, 2006.  She was discharged in improved condition.      Oris Drone Petrarca, P.A.-C.      Carlisle Beers. Rendall, M.D.  Electronically Signed    BDP/MEDQ  D:  07/17/2006  T:  07/17/2006  Job:  [id number]

## 2011-01-18 NOTE — Op Note (Signed)
NAME:  [name], [name]              ACCOUNT NO.:  [id number]   MEDICAL RECORD NO.:  [id number]          PATIENT TYPE:  INP   LOCATION:  0001                         FACILITY:  [facility]   PHYSICIAN:  [name], M.D.  DATE OF BIRTH:  07-19-1971   DATE OF PROCEDURE:  12/15/2006  DATE OF DISCHARGE:                               OPERATIVE REPORT   PREOPERATIVE DIAGNOSIS:  Endstage osteoarthritis left knee with  deformity.   SURGICAL PROCEDURES:  Left LCS total knee replacement with computer  navigation assistance.   POSTOPERATIVE DIAGNOSIS:  Endstage osteoarthritis left knee with  deformity.   SURGEON:  [name], M.D.   ASSISTANT:  Legrand Pitts. Duffy, PA-C.   ANESTHESIA:  General with femoral nerve block.   PATHOLOGY:  The patient has bone against bone medially with a fixed 7  degrees varus and 7 degree flexion contracture with large spurs along  the medial joint, but also surrounding the whole distal femur.   PROCEDURE:  Under general anesthesia with femoral nerve block, the left  leg is prepared with DuraPrep and draped as a sterile field.  Midline  incision is made after application of a sterile tourniquet at 350 mm.  The patella is everted.  The femur is estimated to be a standard plus as  that was what the other side had.  The knee was debrided in preparation  for computer mapping.  Schanz pins were then placed through accessory  stab wounds in the superior medial tibia and distal medial femur.  Once  the arrays were set up, the femoral head was located.  The ankle  malleoli were located and the proximal tibia and distal femur were  mapped.  Once this was completed, the proximal tibia was resected using  the computer nav guide taking 7 mm off the high side.  The tensioner was  then inserted and the ligaments were balanced at approximately within 1  degree of the anatomical axis.  The flexion gap was then measured.  The  first femoral guide was then used and the  anterior and posterior flare  of the distal femur were resected.  This was an approximate 17.5 mm  flexion gap.  Distal femoral cut was then made to balance the 17.5  flexion gap.  Lamina spreader was inserted.  Remnants of the menisci and  cruciates were removed and spurs were taken off the back of the femoral  condyle.  Recessing guide was then used.  With the femur completed, it  should be noted there was a vein stuck at the superior end of the tibia  in the midline.  This was cauterized and turned out to bleed on a couple  of occasions as the knee was being manipulated.  The superior tibia was  then exposed.  It was exactly a size 4.  Center peg hole with keel was  inserted.  Trial of the size 4 tibia, 17.5 bearing and standard plus  femur revealed excellent fit, alignment, but some laxity in  hyperextension.  A 20-mm bearing got rid of the hyperextension and  balanced the knee perfectly.  Patella was then osteotomized and trial  component patella was inserted.  With these components in place, the  knee was within 1.2 degrees of anatomical axis with full extension.  At  age 40, being a moderately heavy person, this was felt to be requirement  to get it within as close as possible technically to anatomical axis and  this was felt to have been achieved.  Once this was completed, the  arrays were taken down.  Permanent components were obtained.  Bony  surfaces prepared with pulse irrigation.  Permanent components were put  in.  Tourniquet was let down at approximately an hour and 10 minutes.  Multiple small vessels were cauterized.  The bleeding finally stopped in  that area in the center behind the tibia.  Medium Hemovac drain  was inserted.  The patient returned to recovery in good condition.  Total operative time approximately an hour and 25 minutes.  It should be  noted [name] PA-C participated in and was there actively  participating in all portions of the case.      Tami  L. Marsh, M.D.  Electronically Signed     JLR/MEDQ  D:  12/15/2006  T:  12/15/2006  Job:  [id number]

## 2011-01-18 NOTE — Discharge Summary (Signed)
NAMESAUDIA, [name] NO.:  [id number]   MEDICAL RECORD NO.:  [id number]                   PATIENT TYPE:  IPS   LOCATION:  0504                                 [facility]ILITY[facility]  [facility]   PHYSICIAN:  Geoffery Lyons, M.D.                   DATE OF BIRTH:  09/21/1970   DATE OF ADMISSION:  12/23/2003  DATE OF DISCHARGE:  12/25/2003                                 DISCHARGE SUMMARY   CHIEF COMPLAINT AND PRESENTING ILLNESS:  This was the first admission to  [facility]  for this 40 year old divorced white female.  Presented to the emergency room after intentionally overdosing on pills and  vodka.  Her mother called pain control and she was taken to [facility] Emergency  Room, was given charcoal.  Was laid off 1-1/2 years ago, unemployment is  running out, 52 year old son and 60 year old daughter were arguing.  The 76  year old ran away.  She felt overwhelmed and she overdosed.   PAST PSYCHIATRIC HISTORY:  No previous treatment.   ALCOHOL AND DRUG HISTORY:  Claimed that she drank a beer or vodka on  weekends but minimizes this use.   PAST MEDICAL HISTORY:  Noncontributory.   MEDICATIONS:  None.   PHYSICAL EXAMINATION:  Performed, failed to show any acute findings.   MENTAL STATUS EXAM:  Reveals an alert, cooperative female, overweight, well  groomed.  Speech normal rate, rhythm and tone.  Mood depressed, affect  congruent.  Thought process clear, rational, goal oriented.  Cognition:  No  evidence of delusions.  Denied any active suicidal or homicidal ideations.  Dealt more with the symptoms, the feeling overwhelmed.  No evidence of  hallucinations.   ADMISSION DIAGNOSES:   AXIS I:  Major depression.   AXIS II:  No diagnosis.   AXIS III:  No diagnosis.   AXIS IV:  Moderate.   AXIS V:  Global assessment of function upon admission 35, highest global  assessment of function in past year 65-70.   LABORATORY WORKUP:  Blood chemistries were within  normal limits.  Drug  screen negative for substances of abuse.  CBC was within normal limits.   COURSE IN HOSPITAL:  She was admitted and started on intensive individual  and group psychotherapy.  She was started on Zoloft 50 mg per day and she  given Ambien for sleep.  She was able to start opening up and sharing in  group and individually of the events that led to the admission, the  difficulty that she was having, not being able to keep a job, unemployment  running out, issues with her 41 year old who had been missing for 8 hours,  feeling very overwhelmed.  She denied any alcohol abuse and felt that that  particular night she was very overwhelmed.  The son who came right back,  they were able to talk about what happened.  He apologized.  She felt much  better about the situation, and on April 24 she was in full contact with  reality.  She was denying any suicidal or homicidal ideation, increased  insight, had worked on Pharmacologist, felt that now she had to be home  because she had to be there for her children.  The next day was a school day  and she was wanting to be sure that he went to school.  Being in the unit  she felt was now producing more stress.  As there were no suicidal or  homicidal ideas, we went ahead and discharged to outpatient follow-up.   DISCHARGE DIAGNOSES:   AXIS I:  Major depression.   AXIS II:  No diagnosis.   AXIS III:  No diagnosis.   AXIS IV:  Moderate.   AXIS V:  Global assessment of function upon discharge 55-60.   DISCHARGE MEDICATIONS:  1. Zoloft 50 mg per day.  2. Ambien 10 at bedtime for sleep.   DISPOSITION:  Follow up at [facility].                                               Geoffery Lyons, M.D.    IL/MEDQ  D:  01/18/2004  T:  01/18/2004  Job:  [id number]

## 2011-01-18 NOTE — Op Note (Signed)
NAME:  [name], [name]              ACCOUNT NO.:  [id number]   MEDICAL RECORD NO.:  [id number]          PATIENT TYPE:  INP   LOCATION:  0001                         FACILITY:  [facility]   PHYSICIAN:  John L. Rendall, M.D.  DATE OF BIRTH:  1971/03/28   DATE OF PROCEDURE:  07/14/2006  DATE OF DISCHARGE:                                 OPERATIVE REPORT   PREOPERATIVE DIAGNOSIS:  Osteoarthritis, right knee with contracture.   SURGICAL PROCEDURES:  Right total knee arthroplasty with computer navigation  assistance.   POSTOPERATIVE DIAGNOSIS:  Osteoarthritis, right knee with contracture.   SURGEON:  John L. Rendall, M.D.   ASSISTANTArlys John D. Petrarca, P.A.-C.   ANESTHESIA:  General with regional nerve block.   PATHOLOGY:  The patient has bone against bone medially, right knee.  Even  though she 40, she weighs 220 after losing virtually 100 pounds.  She has  bone against bone.  Conservative measures including arthroscopic debridement  have failed to relieve her pain.  She has at the time of initiation of the  navigation a 10 degrees fixed flexion contracture and 10 degrees fixed  varus.   The computer navigation is required due to her young age, age 40 and the  amount of deformity that she has.   PROCEDURE:  Under general anesthesia the right leg is prepared with DuraPrep  and draped as a sterile field.  A sterile Esmarch and tourniquet are  applied, 350 mm for the tourniquet.  Midline incision is made.  The patella  is everted.  The femur is sized to a standard plus.  Schanz pins are placed  in the proximal medial tibia and medial distal femur and the navigation  arrays are set up.  The femoral head is found.  The medial and lateral  malleoli are found and proximal tibia and distal femur are mapped.  Once  this is completed, the proximal tibial resection is done using the computer  to assist in making the cut.  Balancing of ligaments is then done even after  the release and removal  of the proximal tibia.  The knee was still in 5-1/2  degrees varus.  It was necessary to sequentially release the  semimembranosus, the posterior medial capsule, the pes anserinus and the  posterior capsule off the femur before finally the knee would come , within  1 degree of anatomic alignment with the use of the tensioner.  Once this  occurred and the tensioner is used in extension and in flexion, then  attention is turned to the femur and the plan is made for a 12.5 bearing and  standard plus femoral component and #4 bearing.  At this point, the anterior  and posterior flare of the distal femur are resected.  This is done within 1  degree of anatomic rotation.  The flexion gap is tested with a spacer block  at 12.5 and it fits nicely.  The distal femoral cut is then made within 1  degree of anatomic alignment.  The spacer block confirms good alignment but  slight hyperextension.  The recessing guide is then used  after first  debriding remnants of the cruciates and the menisci.  Spurs are taken off  the back of the femoral condyle.  Once the recessing guide is used,  attention is turned to the tibia.  It is exposed with __________ and  Linus Orn.  It is sized as almost exactly a #4 and #4 trial is inserted along  with the 12.5 bearing in a standard plus femur.  This allows some  hyperextension and a 15 bearing brought the knee within 1 degree of anatomic  alignment and within 1 degree of full extension.  With these components in  place, the Schanz pins and mapping arrays are taken down.  The patella is  osteotomized and three peg holes made.  The pulse irrigator is used.  All  components are cemented in place.  Once the cement is hardened, the  tourniquet is let down at an hour 3 minutes.  A synovectomy is carried out.  Cautery of the bleeding vessels is done.  Medium Hemovac drain is inserted.  The knee is then closed in layers with #1 Ti-Cron, #1 Vicryl, 2-0 Vicryl and  skin clips.   Operative time less than an hour and a half.  Excellent  stability and alignment of the prosthesis is obtained.  The patient returned  to recovery in good condition.  Jacqualine Code, P.A.-C. was present and  participated in all components of the case.      John L. Rendall, M.D.  Electronically Signed     JLR/MEDQ  D:  07/14/2006  T:  07/14/2006  Job:  [id number]

## 2011-05-29 LAB — CROSSMATCH: ABO/RH(D): A POS

## 2011-05-29 LAB — COMPREHENSIVE METABOLIC PANEL
AST: 222 — ABNORMAL HIGH
BUN: 4 — ABNORMAL LOW
CO2: 24
Calcium: 8 — ABNORMAL LOW
Creatinine, Ser: 0.68
GFR calc Af Amer: >60
GFR calc non Af Amer: >60

## 2011-05-29 LAB — BASIC METABOLIC PANEL
BUN: 3 — ABNORMAL LOW
BUN: 5 — ABNORMAL LOW
CO2: 24
CO2: 25
Calcium: 8.2 — ABNORMAL LOW
Chloride: 111
Chloride: 112
Creatinine, Ser: 0.62
Creatinine, Ser: 0.68
Glucose, Bld: 104 — ABNORMAL HIGH
Potassium: 3.3 — ABNORMAL LOW

## 2011-05-29 LAB — HEPATIC FUNCTION PANEL
Albumin: 2 — ABNORMAL LOW
Alkaline Phosphatase: 118 — ABNORMAL HIGH
Indirect Bilirubin: 0.5
Total Bilirubin: 0.7

## 2011-05-29 LAB — CBC
HCT: 23.5 — ABNORMAL LOW
MCHC: 33.1
MCHC: 34
MCHC: 34.1
MCV: 82.9
MCV: 83.9
MCV: 84.5
Platelets: 215
Platelets: 235
RBC: 2.79 — ABNORMAL LOW
RBC: 3.21 — ABNORMAL LOW
RDW: 16.4 — ABNORMAL HIGH
WBC: 11.9 — ABNORMAL HIGH

## 2011-05-29 LAB — DIFFERENTIAL
Basophils Absolute: 0
Basophils Relative: 0
Basophils Relative: 0
Eosinophils Absolute: 0.1
Eosinophils Absolute: 0.3
Eosinophils Relative: 0
Eosinophils Relative: 1
Lymphocytes Relative: 10 — ABNORMAL LOW
Lymphs Abs: 1.2
Lymphs Abs: 2
Monocytes Relative: 6
Monocytes Relative: 7
Neutro Abs: 10 — ABNORMAL HIGH
Neutro Abs: 8.1 — ABNORMAL HIGH
Neutrophils Relative %: 71
Neutrophils Relative %: 79 — ABNORMAL HIGH
Neutrophils Relative %: 84 — ABNORMAL HIGH

## 2011-05-29 LAB — HEPATITIS PANEL, ACUTE: Hep A IgM: NEGATIVE

## 2011-05-29 LAB — URINALYSIS, ROUTINE W REFLEX MICROSCOPIC
Specific Gravity, Urine: 1.015
pH: 6

## 2011-05-29 LAB — PREGNANCY, URINE: Preg Test, Ur: NEGATIVE

## 2011-05-29 LAB — URINE MICROSCOPIC-ADD ON

## 2011-05-29 LAB — IRON AND TIBC
Iron: <10 — ABNORMAL LOW
UIBC: 219

## 2011-05-29 LAB — CULTURE, BLOOD (ROUTINE X 2)
Culture: NO GROWTH
Report Status: 5262009

## 2011-05-29 LAB — TSH: TSH: 4.06

## 2011-05-29 LAB — VITAMIN B12: Vitamin B-12: 423 (ref 211–911)

## 2011-07-31 ENCOUNTER — Emergency Department (HOSPITAL_COMMUNITY)
Admission: EM | Admit: 2011-07-31 | Discharge: 2011-07-31 | Disposition: A | Discharge status: Home / Self Care | Payer: Self-pay | Attending: Emergency Medicine | Admitting: Emergency Medicine

## 2011-07-31 ENCOUNTER — Encounter: Payer: Self-pay | Admitting: *Deleted

## 2011-07-31 DIAGNOSIS — S8000XA Contusion of unspecified knee, initial encounter: Secondary | ICD-10-CM | POA: Insufficient documentation

## 2011-07-31 DIAGNOSIS — J3489 Other specified disorders of nose and nasal sinuses: Secondary | ICD-10-CM | POA: Insufficient documentation

## 2011-07-31 DIAGNOSIS — W010XXA Fall on same level from slipping, tripping and stumbling without subsequent striking against object, initial encounter: Secondary | ICD-10-CM | POA: Insufficient documentation

## 2011-07-31 DIAGNOSIS — R05 Cough: Secondary | ICD-10-CM | POA: Insufficient documentation

## 2011-07-31 DIAGNOSIS — IMO0001 Reserved for inherently not codable concepts without codable children: Secondary | ICD-10-CM | POA: Insufficient documentation

## 2011-07-31 DIAGNOSIS — R059 Cough, unspecified: Secondary | ICD-10-CM | POA: Insufficient documentation

## 2011-07-31 DIAGNOSIS — F17200 Nicotine dependence, unspecified, uncomplicated: Secondary | ICD-10-CM | POA: Insufficient documentation

## 2011-07-31 DIAGNOSIS — Z96659 Presence of unspecified artificial knee joint: Secondary | ICD-10-CM | POA: Insufficient documentation

## 2011-07-31 MED ORDER — HYDROCODONE-ACETAMINOPHEN 5-325 MG PO TABS: ORAL_TABLET | ORAL | Status: AC

## 2011-07-31 MED ORDER — AZITHROMYCIN 250 MG PO TABS: ORAL_TABLET | ORAL | Status: DC

## 2011-07-31 NOTE — ED Provider Notes (Signed)
History     CSN: [id number] Arrival date & time: 07/31/2011  1:02 PM   First MD Initiated Contact with Patient 07/31/11 1345      Chief Complaint  Patient presents with  . Fever  . Fall  . Knee Pain    (Consider location/radiation/quality/duration/timing/severity/associated sxs/prior treatment) HPI Comments: patinet c/o cough , body aches and chills for 3 days.  She denies chst pain, SOB, fever.  She alos c/o pain to the left knee that began last evening after she slipped fell and landed on the knee.  She denies LOC or other injuries. States she is able to weight bear to the left knee and has full ROM , she just has pain with movement.  Patient is a 40 y.o. female presenting with cough. The history is provided by the patient.  Cough This is a new problem. The current episode started more than 2 days ago. The problem occurs constantly. The problem has not changed since onset.The cough is productive of sputum. There has been no fever. Associated symptoms include chills, rhinorrhea and myalgias. Pertinent negatives include no chest pain, no sweats, no ear pain, no headaches, no sore throat, no shortness of breath and no wheezing. She has tried nothing for the symptoms. She is a smoker. Her past medical history does not include bronchitis, pneumonia or asthma.    History reviewed. No pertinent past medical history.  Past Surgical History  Procedure Date  . Replacement total knee bilateral     History reviewed. No pertinent family history.  History  Substance Use Topics  . Smoking status: Current Everyday Smoker -- 0.5 packs/day  . Smokeless tobacco: Not on file  . Alcohol Use: No    OB History    Grav Para Term Preterm Abortions TAB SAB Ect Mult Living                  Review of Systems  Constitutional: Positive for chills. Negative for fever, activity change, appetite change and fatigue.  HENT: Positive for congestion and rhinorrhea. Negative for ear pain, sore throat,  trouble swallowing, neck pain and neck stiffness.   Eyes: Negative for pain and visual disturbance.  Respiratory: Positive for cough. Negative for chest tightness, shortness of breath and wheezing.   Cardiovascular: Negative for chest pain.  Gastrointestinal: Negative for nausea, vomiting and abdominal pain.  Genitourinary: Negative for dysuria.  Musculoskeletal: Positive for myalgias and arthralgias. Negative for back pain and gait problem.  Skin: Negative for rash.  Neurological: Negative for dizziness, weakness, numbness and headaches.  Hematological: Does not bruise/bleed easily.  All other systems reviewed and are negative.    Allergies  Penicillins  Home Medications   Current Outpatient Rx  Name Route Sig Dispense Refill  . DICLOFENAC SODIUM 75 MG PO TBEC Oral Take 75 mg by mouth 2 (two) times daily.        BP 114/50  Pulse 88  Temp(Src) 98.3 F (36.8 C) (Oral)  Resp 22  Ht 5\' 8"  (1.727 m)  Wt 250 lb (113.399 kg)  BMI 38.01 kg/m2  SpO2 100%  LMP 07/17/2011  Physical Exam  Nursing note and vitals reviewed. Constitutional: She is oriented to person, place, and time. She appears well-developed and well-nourished. No distress.  HENT:  Head: Normocephalic and atraumatic. No trismus in the jaw.  Right Ear: Tympanic membrane and ear canal normal.  Left Ear: Tympanic membrane and ear canal normal.  Nose: Rhinorrhea present.  Mouth/Throat: Uvula is midline and oropharynx is clear  and moist. No uvula swelling.  Eyes: EOM are normal. Pupils are equal, round, and reactive to light.  Neck: Normal range of motion. Neck supple.  Cardiovascular: Normal rate, regular rhythm and normal heart sounds.   Pulmonary/Chest: Effort normal and breath sounds normal. No respiratory distress. She has no wheezes. She has no rales. She exhibits no tenderness.  Abdominal: Soft. She exhibits no distension. There is no tenderness.  Musculoskeletal: Normal range of motion. She exhibits  tenderness. She exhibits no edema.       Left knee: She exhibits normal range of motion, no swelling, no effusion, no ecchymosis, no deformity, no laceration, no erythema, normal patellar mobility and no bony tenderness. tenderness found. No medial joint line, no lateral joint line and no patellar tendon tenderness noted.  Lymphadenopathy:    She has no cervical adenopathy.  Neurological: She is alert and oriented to person, place, and time. No cranial nerve deficit. She exhibits normal muscle tone. Coordination normal.  Skin: Skin is warm and dry.  Psychiatric: She has a normal mood and affect.    ED Course  Procedures (including critical care time)       MDM     3:01 PM patient is alert, NAD.  Coarse lung sounds throughout, no rales or wheezes.  Non-toxic appearing.  Has diffuse ttp of the anterior left knee.  No effusion, bruising, erythema or deformity.  Pt has full ROM of the knee joint.  No recent fever, patient bears weight to the knee w/o difficulty.  Clinical suspicion for septic joint is low.  Will treat with abx and cough syrup     Mischell Branford L. [place], [place] 08/01/11 2212

## 2011-07-31 NOTE — ED Notes (Addendum)
Pt states she has been sick for 3 days with fever and chills. Pt states she fell last pm and injured her left knee.

## 2011-08-02 NOTE — ED Provider Notes (Signed)
Medical screening examination/treatment/procedure(s) were performed by non-physician practitioner and as supervising physician I was immediately available for consultation/collaboration.   Muriel Wilber L Tylar Amborn, MD 08/02/11 1338 

## 2013-06-28 ENCOUNTER — Emergency Department (HOSPITAL_COMMUNITY)
Admission: EM | Admit: 2013-06-28 | Discharge: 2013-06-28 | Disposition: A | Discharge status: Home / Self Care | Payer: Self-pay | Attending: Emergency Medicine | Admitting: Emergency Medicine

## 2013-06-28 ENCOUNTER — Emergency Department (HOSPITAL_COMMUNITY): Payer: Self-pay

## 2013-06-28 ENCOUNTER — Encounter (HOSPITAL_COMMUNITY): Payer: Self-pay | Admitting: Emergency Medicine

## 2013-06-28 DIAGNOSIS — Y9301 Activity, walking, marching and hiking: Secondary | ICD-10-CM | POA: Insufficient documentation

## 2013-06-28 DIAGNOSIS — Z880 Allergy status to penicillin: Secondary | ICD-10-CM | POA: Insufficient documentation

## 2013-06-28 DIAGNOSIS — W010XXA Fall on same level from slipping, tripping and stumbling without subsequent striking against object, initial encounter: Secondary | ICD-10-CM | POA: Insufficient documentation

## 2013-06-28 DIAGNOSIS — W19XXXA Unspecified fall, initial encounter: Secondary | ICD-10-CM

## 2013-06-28 DIAGNOSIS — Y9289 Other specified places as the place of occurrence of the external cause: Secondary | ICD-10-CM | POA: Insufficient documentation

## 2013-06-28 DIAGNOSIS — S339XXA Sprain of unspecified parts of lumbar spine and pelvis, initial encounter: Secondary | ICD-10-CM | POA: Insufficient documentation

## 2013-06-28 DIAGNOSIS — S8000XA Contusion of unspecified knee, initial encounter: Secondary | ICD-10-CM | POA: Insufficient documentation

## 2013-06-28 DIAGNOSIS — F17200 Nicotine dependence, unspecified, uncomplicated: Secondary | ICD-10-CM | POA: Insufficient documentation

## 2013-06-28 DIAGNOSIS — Z791 Long term (current) use of non-steroidal anti-inflammatories (NSAID): Secondary | ICD-10-CM | POA: Insufficient documentation

## 2013-06-28 DIAGNOSIS — S39012A Strain of muscle, fascia and tendon of lower back, initial encounter: Secondary | ICD-10-CM

## 2013-06-28 DIAGNOSIS — S8002XA Contusion of left knee, initial encounter: Secondary | ICD-10-CM

## 2013-06-28 MED ORDER — MELOXICAM 7.5 MG PO TABS: 7.5000 mg | ORAL_TABLET | Freq: Every day | ORAL | Status: DC

## 2013-06-28 MED ORDER — CYCLOBENZAPRINE HCL 10 MG PO TABS
10.0000 mg | ORAL_TABLET | Freq: Once | ORAL | Status: AC
  Administered 2013-06-28: 10 mg via ORAL
  Filled 2013-06-28: qty 1

## 2013-06-28 MED ORDER — HYDROCODONE-ACETAMINOPHEN 5-325 MG PO TABS: 1.0000 | ORAL_TABLET | ORAL | Status: DC | PRN

## 2013-06-28 MED ORDER — CYCLOBENZAPRINE HCL 10 MG PO TABS: 10.0000 mg | ORAL_TABLET | Freq: Two times a day (BID) | ORAL | Status: DC | PRN

## 2013-06-28 MED ORDER — OXYCODONE-ACETAMINOPHEN 5-325 MG PO TABS
1.0000 | ORAL_TABLET | Freq: Once | ORAL | Status: AC
  Administered 2013-06-28: 1 via ORAL
  Filled 2013-06-28: qty 1

## 2013-06-28 NOTE — ED Notes (Signed)
Tripped and fell today over a puppy.  Hurts lt knee and thigh, low back.

## 2013-06-28 NOTE — ED Provider Notes (Signed)
CSN: [id number]     Arrival date & time 06/28/13  1952 History   First MD Initiated Contact with Patient 06/28/13 2101     Chief Complaint  Patient presents with  . Fall   (Consider location/radiation/quality/duration/timing/severity/associated sxs/prior Treatment) Patient is a 42 y.o. female presenting with fall. The history is provided by the patient.  Fall This is a new problem. The current episode started today. The problem has been unchanged. Pertinent negatives include no abdominal pain, chills, fever, headaches, nausea, sore throat or vomiting.   [name] is a 42 y.o. female who presents to the ED with pain in her left knee and lower back s/p fall earlier today. She was walking and tripped over her puppy. Denies LOC or other injuries. Has not taken anything for pain. Put an ice pack on the knee without relief. She has had a total knee replacement and concerned about the fall.   History reviewed. No pertinent past medical history. Past Surgical History  Procedure Laterality Date  . Replacement total knee bilateral    . Tubal ligation     History reviewed. No pertinent family history. History  Substance Use Topics  . Smoking status: Current Every Day Smoker -- 0.50 packs/day  . Smokeless tobacco: Not on file  . Alcohol Use: No   OB History   Grav Para Term Preterm Abortions TAB SAB Ect Mult Living                 Review of Systems  Constitutional: Negative for fever and chills.  HENT: Negative for nosebleeds and sore throat.   Eyes: Negative for visual disturbance.  Gastrointestinal: Negative for nausea, vomiting and abdominal pain.  Genitourinary: Negative for dysuria and urgency.  Musculoskeletal: Positive for back pain.       Left knee pain, left shoulder pain  Skin: Negative for wound.  Neurological: Negative for headaches.  Psychiatric/Behavioral: The patient is not nervous/anxious.     Allergies  Penicillins  Home Medications   Current Outpatient  Rx  Name  Route  Sig  Dispense  Refill  . azithromycin (ZITHROMAX Z-PAK) 250 MG tablet      Take two tablets on day one, then one tab qd days 2-5   6 tablet   0   . diclofenac (VOLTAREN) 75 MG EC tablet   Oral   Take 75 mg by mouth 2 (two) times daily.            BP 115/71  Pulse 88  Temp(Src) 98.5 F (36.9 C) (Oral)  Resp 20  Ht 5' 7.5" (1.715 m)  Wt 275 lb (124.739 kg)  BMI 42.41 kg/m2  SpO2 99%  LMP 06/14/2013 Physical Exam  Nursing note and vitals reviewed. Constitutional: She is oriented to person, place, and time. She appears well-developed and well-nourished. No distress.  HENT:  Head: Normocephalic and atraumatic.  Eyes: Conjunctivae and EOM are normal. Pupils are equal, round, and reactive to light.  Neck: Neck supple.  Cardiovascular: Normal rate and regular rhythm.   Pulmonary/Chest: Effort normal and breath sounds normal.  Abdominal: Soft. Bowel sounds are normal. There is no tenderness.  Musculoskeletal:       Left knee: She exhibits swelling and ecchymosis. She exhibits no deformity and no erythema. Tenderness found.       Lumbar back: She exhibits tenderness and spasm. She exhibits normal range of motion.       Back:       Legs: Ecchymosis, tenderness MCL and LCL.  Pain with flexion and extension. Pedal pulse strong, adequate circulation, good touch sensation.   Neurological: She is alert and oriented to person, place, and time. She has normal strength. No cranial nerve deficit or sensory deficit. Gait normal.  Pedal pulses equal, adequate circulation, good touch sensation.  Skin: Skin is warm and dry.  Psychiatric: She has a normal mood and affect. Her behavior is normal.   Dg Knee Complete 4 Views Left  06/28/2013   CLINICAL DATA:  Pain post fall  EXAM: LEFT KNEE - COMPLETE 4+ VIEW  COMPARISON:  05/13/2010  FINDINGS: There is no evidence of fracture, dislocation, or joint effusion. Components of knee arthroplasty project in expected location, intact  without surrounding lucency. There is no evidence of arthropathy or other focal bone abnormality. Soft tissues are unremarkable.  IMPRESSION: Negative.   Electronically Signed   By: Oley Balm M.D.   On: 06/28/2013 21:30    ED Course  Procedures  MDM  42 y.o. female with left knee contusion and lower back pain. Patient placed in knee immobilizer which she states helped a lot. Will treat for pain and muscle spasm. She will follow up with her doctor.  I have reviewed this patient's vital signs, nurses notes, appropriate labs and imaging.  I have discussed findings and plan of care with the patient and she voices understanding. She remains neurovascularly intact and is stable for discharge without any immediate complications.    Medication List    TAKE these medications       cyclobenzaprine 10 MG tablet  Commonly known as:  FLEXERIL  Take 1 tablet (10 mg total) by mouth 2 (two) times daily as needed for muscle spasms.     HYDROcodone-acetaminophen 5-325 MG per tablet  Commonly known as:  NORCO/VICODIN  Take 1 tablet by mouth every 4 (four) hours as needed.     meloxicam 7.5 MG tablet  Commonly known as:  MOBIC  Take 1 tablet (7.5 mg total) by mouth daily.      ASK your doctor about these medications       azithromycin 250 MG tablet  Commonly known as:  ZITHROMAX Z-PAK  Take two tablets on day one, then one tab qd days 2-5     diclofenac 75 MG EC tablet  Commonly known as:  VOLTAREN  Take 75 mg by mouth 2 (two) times daily.           [facility] Orlene Och, [place] 06/29/13 [id number]

## 2013-06-29 NOTE — ED Provider Notes (Signed)
Medical screening examination/treatment/procedure(s) were performed by non-physician practitioner and as supervising physician I was immediately available for consultation/collaboration.  EKG Interpretation   None         Athalee Esterline M Juliya Magill, MD 06/29/13 0127 

## 2014-10-02 ENCOUNTER — Emergency Department (HOSPITAL_COMMUNITY): Payer: Self-pay

## 2014-10-02 ENCOUNTER — Emergency Department (HOSPITAL_COMMUNITY)
Admission: EM | Admit: 2014-10-02 | Discharge: 2014-10-02 | Disposition: A | Discharge status: Home / Self Care | Payer: Self-pay | Attending: Emergency Medicine | Admitting: Emergency Medicine

## 2014-10-02 ENCOUNTER — Encounter (HOSPITAL_COMMUNITY): Payer: Self-pay | Admitting: Emergency Medicine

## 2014-10-02 DIAGNOSIS — Z880 Allergy status to penicillin: Secondary | ICD-10-CM | POA: Insufficient documentation

## 2014-10-02 DIAGNOSIS — Z79899 Other long term (current) drug therapy: Secondary | ICD-10-CM | POA: Insufficient documentation

## 2014-10-02 DIAGNOSIS — M1990 Unspecified osteoarthritis, unspecified site: Secondary | ICD-10-CM | POA: Insufficient documentation

## 2014-10-02 DIAGNOSIS — R112 Nausea with vomiting, unspecified: Secondary | ICD-10-CM | POA: Insufficient documentation

## 2014-10-02 DIAGNOSIS — Z720 Tobacco use: Secondary | ICD-10-CM | POA: Insufficient documentation

## 2014-10-02 DIAGNOSIS — Z3202 Encounter for pregnancy test, result negative: Secondary | ICD-10-CM | POA: Insufficient documentation

## 2014-10-02 DIAGNOSIS — G8929 Other chronic pain: Secondary | ICD-10-CM | POA: Insufficient documentation

## 2014-10-02 DIAGNOSIS — R197 Diarrhea, unspecified: Secondary | ICD-10-CM | POA: Insufficient documentation

## 2014-10-02 HISTORY — DX: Unspecified osteoarthritis, unspecified site: M19.90

## 2014-10-02 HISTORY — DX: Dorsalgia, unspecified: M54.9

## 2014-10-02 HISTORY — DX: Other chronic pain: G89.29

## 2014-10-02 LAB — COMPREHENSIVE METABOLIC PANEL
ALT: 11 U/L (ref 0–35)
AST: 19 U/L (ref 0–37)
Albumin: 4.3 g/dL (ref 3.5–5.2)
Alkaline Phosphatase: 63 U/L (ref 39–117)
Anion gap: 5 (ref 5–15)
BILIRUBIN TOTAL: 0.6 mg/dL (ref 0.3–1.2)
BUN: 15 mg/dL (ref 6–23)
CALCIUM: 9.4 mg/dL (ref 8.4–10.5)
CHLORIDE: 111 mmol/L (ref 96–112)
CO2: 22 mmol/L (ref 19–32)
Creatinine, Ser: 0.65 mg/dL (ref 0.50–1.10)
GFR calc non Af Amer: >90 mL/min (ref >90)
GLUCOSE: 105 mg/dL — AB (ref 70–99)
Potassium: 3.5 mmol/L (ref 3.5–5.1)
SODIUM: 138 mmol/L (ref 135–145)
Total Protein: 7.9 g/dL (ref 6.0–8.3)

## 2014-10-02 LAB — CBC WITH DIFFERENTIAL/PLATELET
BASOS PCT: 0 % (ref 0–1)
Basophils Absolute: 0 10*3/uL (ref 0.0–0.1)
Eosinophils Absolute: 0 10*3/uL (ref 0.0–0.7)
Eosinophils Relative: 0 % (ref 0–5)
HEMATOCRIT: 36.1 % (ref 36.0–46.0)
HEMOGLOBIN: 11.8 g/dL — AB (ref 12.0–15.0)
LYMPHS ABS: 1.7 10*3/uL (ref 0.7–4.0)
LYMPHS PCT: 12 % (ref 12–46)
MCH: 29.5 pg (ref 26.0–34.0)
MCHC: 32.7 g/dL (ref 30.0–36.0)
MCV: 90.3 fL (ref 78.0–100.0)
MONO ABS: 0.7 10*3/uL (ref 0.1–1.0)
Monocytes Relative: 5 % (ref 3–12)
NEUTROS ABS: 11.6 10*3/uL — AB (ref 1.7–7.7)
Neutrophils Relative %: 83 % — ABNORMAL HIGH (ref 43–77)
Platelets: 276 10*3/uL (ref 150–400)
RBC: 4 MIL/uL (ref 3.87–5.11)
RDW: 17.2 % — ABNORMAL HIGH (ref 11.5–15.5)
WBC: 14.1 10*3/uL — ABNORMAL HIGH (ref 4.0–10.5)

## 2014-10-02 LAB — URINALYSIS, ROUTINE W REFLEX MICROSCOPIC
GLUCOSE, UA: NEGATIVE mg/dL
HGB URINE DIPSTICK: NEGATIVE
LEUKOCYTES UA: NEGATIVE
NITRITE: NEGATIVE
PH: 5.5 (ref 5.0–8.0)
Protein, ur: 30 mg/dL — AB
Specific Gravity, Urine: >1.03 — ABNORMAL HIGH (ref 1.005–1.030)
Urobilinogen, UA: 0.2 mg/dL (ref 0.0–1.0)

## 2014-10-02 LAB — URINE MICROSCOPIC-ADD ON

## 2014-10-02 LAB — PREGNANCY, URINE: Preg Test, Ur: NEGATIVE

## 2014-10-02 LAB — LIPASE, BLOOD: LIPASE: 15 U/L (ref 11–59)

## 2014-10-02 MED ORDER — SODIUM CHLORIDE 0.9 % IV BOLUS (SEPSIS): 1000.0000 mL | Freq: Once | INTRAVENOUS | Status: DC

## 2014-10-02 MED ORDER — DICYCLOMINE HCL 20 MG PO TABS
20.0000 mg | ORAL_TABLET | Freq: Four times a day (QID) | ORAL | Status: DC | PRN
Start: 2014-10-02 — End: 2017-01-22

## 2014-10-02 MED ORDER — ONDANSETRON HCL 4 MG PO TABS: 4.0000 mg | ORAL_TABLET | Freq: Three times a day (TID) | ORAL | Status: DC | PRN

## 2014-10-02 MED ORDER — FAMOTIDINE IN NACL 20-0.9 MG/50ML-% IV SOLN
20.0000 mg | Freq: Once | INTRAVENOUS | Status: AC
  Administered 2014-10-02: 20 mg via INTRAVENOUS
  Filled 2014-10-02: qty 50

## 2014-10-02 MED ORDER — ONDANSETRON HCL 4 MG/2ML IJ SOLN
4.0000 mg | INTRAMUSCULAR | Status: DC | PRN
  Administered 2014-10-02: 4 mg via INTRAVENOUS
  Filled 2014-10-02: qty 2

## 2014-10-02 MED ORDER — DICYCLOMINE HCL 10 MG/ML IM SOLN
20.0000 mg | Freq: Once | INTRAMUSCULAR | Status: AC
  Administered 2014-10-02: 20 mg via INTRAMUSCULAR
  Filled 2014-10-02: qty 2

## 2014-10-02 NOTE — ED Provider Notes (Signed)
CSN: [id number]     Arrival date & time 10/02/14  1740 History   First MD Initiated Contact with Patient 10/02/14 1805     Chief Complaint  Patient presents with  . Abdominal Pain      HPI Pt was seen at 1820. Per pt, c/o gradual onset and persistence of multiple intermittent episodes of N/V/D that began this morning at 8am. Describes the stools as "watery."  Has been associated with generalize abd "cramping." Denies CP/SOB, no back pain, no fevers, no black or blood in stools or emesis.     Past Medical History  Diagnosis Date  . Chronic back pain   . Arthritis    Past Surgical History  Procedure Laterality Date  . Replacement total knee bilateral    . Tubal ligation    . Cholecystectomy      History  Substance Use Topics  . Smoking status: Current Every Day Smoker -- 0.50 packs/day for 20 years    Types: Cigarettes  . Smokeless tobacco: Never Used  . Alcohol Use: No   OB History    Gravida Para Term Preterm AB TAB SAB Ectopic Multiple Living   4 4 4       4      Review of Systems ROS: Statement: All systems negative except as marked or noted in the HPI; Constitutional: Negative for fever and chills. ; ; Eyes: Negative for eye pain, redness and discharge. ; ; ENMT: Negative for ear pain, hoarseness, nasal congestion, sinus pressure and sore throat. ; ; Cardiovascular: Negative for chest pain, palpitations, diaphoresis, dyspnea and peripheral edema. ; ; Respiratory: Negative for cough, wheezing and stridor. ; ; Gastrointestinal: +N/V/D, abd pain. Negative for blood in stool, hematemesis, jaundice and rectal bleeding. . ; ; Genitourinary: Negative for dysuria, flank pain and hematuria. ; ; Musculoskeletal: Negative for back pain and neck pain. Negative for swelling and trauma.; ; Skin: Negative for pruritus, rash, abrasions, blisters, bruising and skin lesion.; ; Neuro: Negative for headache, lightheadedness and neck stiffness. Negative for weakness, altered level of  consciousness , altered mental status, extremity weakness, paresthesias, involuntary movement, seizure and syncope.      Allergies  Penicillins  Home Medications   Prior to Admission medications   Medication Sig Start Date End Date Taking? Authorizing Provider  bismuth subsalicylate (PEPTO BISMOL) 262 MG chewable tablet Chew 524 mg by mouth as needed for indigestion or diarrhea or loose stools.   Yes Historical Provider, MD  HYDROcodone-acetaminophen (NORCO) 7.5-325 MG per tablet Take 1 tablet by mouth every 6 (six) hours as needed for moderate pain.   Yes Historical Provider, MD  mirtazapine (REMERON) 30 MG tablet Take 30 mg by mouth at bedtime.   Yes Historical Provider, MD  azithromycin (ZITHROMAX Z-PAK) 250 MG tablet Take two tablets on day one, then one tab qd days 2-5 Patient not taking: Reported on 10/02/2014 07/31/11   Tammy L. Triplett, PA-C  cyclobenzaprine (FLEXERIL) 10 MG tablet Take 1 tablet (10 mg total) by mouth 2 (two) times daily as needed for muscle spasms. Patient not taking: Reported on 10/02/2014 06/28/13   06/30/13, NP  HYDROcodone-acetaminophen (NORCO/VICODIN) 5-325 MG per tablet Take 1 tablet by mouth every 4 (four) hours as needed. Patient not taking: Reported on 10/02/2014 06/28/13   06/30/13, NP  meloxicam (MOBIC) 7.5 MG tablet Take 1 tablet (7.5 mg total) by mouth daily. Patient not taking: Reported on 10/02/2014 06/28/13   06/30/13, NP   BP  126/69 mmHg  Pulse 76  Temp(Src) 98.4 F (36.9 C) (Oral)  Resp 20  Ht 5' 7.5" (1.715 m)  Wt 194 lb 1.6 oz (88.043 kg)  BMI 29.93 kg/m2  SpO2 100%  LMP 09/16/2014 Physical Exam  1825: Physical examination:  Nursing notes reviewed; Vital signs and O2 SAT reviewed;  Constitutional: Well developed, Well nourished, Well hydrated, In no acute distress; Head:  Normocephalic, atraumatic; Eyes: EOMI, PERRL, No scleral icterus; ENMT: Mouth and pharynx normal, Mucous membranes moist; Neck: Supple, Full range of motion,  No lymphadenopathy; Cardiovascular: Regular rate and rhythm, No murmur, rub, or gallop; Respiratory: Breath sounds clear & equal bilaterally, No rales, rhonchi, wheezes.  Speaking full sentences with ease, Normal respiratory effort/excursion; Chest: Nontender, Movement normal; Abdomen: Soft, +mild diffuse tenderness to palp. No rebound or guarding. Nondistended, Normal bowel sounds; Genitourinary: No CVA tenderness; Extremities: Pulses normal, No tenderness, No edema, No calf edema or asymmetry.; Neuro: AA&Ox3, Major CN grossly intact.  Speech clear. No gross focal motor or sensory deficits in extremities.; Skin: Color normal, Warm, Dry.   ED Course  Procedures     EKG Interpretation None      MDM  MDM Reviewed: previous chart, nursing note and vitals Reviewed previous: labs Interpretation: labs and x-ray      Results for orders placed or performed during the hospital encounter of 10/02/14  CBC with Differential  Result Value Ref Range   WBC 14.1 (H) 4.0 - 10.5 K/uL   RBC 4.00 3.87 - 5.11 MIL/uL   Hemoglobin 11.8 (L) 12.0 - 15.0 g/dL   HCT 34.9 17.9 - 15.0 %   MCV 90.3 78.0 - 100.0 fL   MCH 29.5 26.0 - 34.0 pg   MCHC 32.7 30.0 - 36.0 g/dL   RDW 56.9 (H) 79.4 - 80.1 %   Platelets 276 150 - 400 K/uL   Neutrophils Relative % 83 (H) 43 - 77 %   Neutro Abs 11.6 (H) 1.7 - 7.7 K/uL   Lymphocytes Relative 12 12 - 46 %   Lymphs Abs 1.7 0.7 - 4.0 K/uL   Monocytes Relative 5 3 - 12 %   Monocytes Absolute 0.7 0.1 - 1.0 K/uL   Eosinophils Relative 0 0 - 5 %   Eosinophils Absolute 0.0 0.0 - 0.7 K/uL   Basophils Relative 0 0 - 1 %   Basophils Absolute 0.0 0.0 - 0.1 K/uL  Comprehensive metabolic panel  Result Value Ref Range   Sodium 138 135 - 145 mmol/L   Potassium 3.5 3.5 - 5.1 mmol/L   Chloride 111 96 - 112 mmol/L   CO2 22 19 - 32 mmol/L   Glucose, Bld 105 (H) 70 - 99 mg/dL   BUN 15 6 - 23 mg/dL   Creatinine, Ser 6.55 0.50 - 1.10 mg/dL   Calcium 9.4 8.4 - 37.4 mg/dL   Total  Protein 7.9 6.0 - 8.3 g/dL   Albumin 4.3 3.5 - 5.2 g/dL   AST 19 0 - 37 U/L   ALT 11 0 - 35 U/L   Alkaline Phosphatase 63 39 - 117 U/L   Total Bilirubin 0.6 0.3 - 1.2 mg/dL   GFR calc non Af Amer >90 >90 mL/min   GFR calc Af Amer >90 >90 mL/min   Anion gap 5 5 - 15  Urinalysis, Routine w reflex microscopic  Result Value Ref Range   Color, Urine YELLOW YELLOW   APPearance CLEAR CLEAR   Specific Gravity, Urine >1.030 (H) 1.005 - 1.030  pH 5.5 5.0 - 8.0   Glucose, UA NEGATIVE NEGATIVE mg/dL   Hgb urine dipstick NEGATIVE NEGATIVE   Bilirubin Urine SMALL (A) NEGATIVE   Ketones, ur >80 (A) NEGATIVE mg/dL   Protein, ur 30 (A) NEGATIVE mg/dL   Urobilinogen, UA 0.2 0.0 - 1.0 mg/dL   Nitrite NEGATIVE NEGATIVE   Leukocytes, UA NEGATIVE NEGATIVE  Pregnancy, urine  Result Value Ref Range   Preg Test, Ur NEGATIVE NEGATIVE  Lipase, blood  Result Value Ref Range   Lipase 15 11 - 59 U/L  Urine microscopic-add on  Result Value Ref Range   Squamous Epithelial / LPF RARE RARE   WBC, UA 0-2 <3 WBC/hpf   RBC / HPF 0-2 <3 RBC/hpf   Bacteria, UA RARE RARE   Dg Abd Acute W/chest 10/02/2014   CLINICAL DATA:  Lower abdominal pain for 1 day  EXAM: ACUTE ABDOMEN SERIES (ABDOMEN 2 VIEW & CHEST 1 VIEW)  COMPARISON:  CT abdomen and pelvis December 15, 2009  FINDINGS: PA chest: No edema or consolidation. The heart size and pulmonary vascularity are normal. No adenopathy.  Supine and upright abdomen: There is a relative paucity of gas. There is no bowel dilatation or air-fluid level suggesting obstruction. No free air. There are surgical clips in the right upper quadrant. There are phleboliths in the pelvis. There is lumbar dextroscoliosis.  IMPRESSION: Relative paucity of bowel gas. This finding may be seen normally but also may be indicative of early ileus or enteritis. Obstruction is not felt to be likely. No free air. Lungs clear.   Electronically Signed   By: Bretta Bang M.D.   On: 10/02/2014 19:55     2050:  Pt has tol PO well while in the ED without N/V.  No stooling while in the ED.  Abd benign, VSS. Feels better and wants to go home now. Dx and testing d/w pt and family.  Questions answered.  Verb understanding, agreeable to d/c home with outpt f/u.    Samuel Jester, DO 10/04/14 1652

## 2014-10-02 NOTE — ED Notes (Signed)
Patient able to retain po fluids 

## 2014-10-02 NOTE — ED Notes (Signed)
Patient c/o lower abd pain with nausea, vomiting and diarrhea. Unsure of any fevers but reports "cold sweats." Denies any urinary symptoms. Reports taking Pepto-bismol with no relief.  Per patient fatigue.

## 2014-10-02 NOTE — Discharge Instructions (Signed)
°Emergency Department Resource Guide °1) Find a Doctor and Pay Out of Pocket °Although you won't have to find out who is covered by your insurance plan, it is a good idea to ask around and get recommendations. You will then need to call the office and see if the doctor you have chosen will accept you as a new patient and what types of options they offer for patients who are self-pay. Some doctors offer discounts or will set up payment plans for their patients who do not have insurance, but you will need to ask so you aren't surprised when you get to your appointment. ° °2) Contact Your Local Health Department °Not all health departments have doctors that can see patients for sick visits, but many do, so it is worth a call to see if yours does. If you don't know where your local health department is, you can check in your phone book. The CDC also has a tool to help you locate your state's health department, and many state websites also have listings of all of their local health departments. ° °3) Find a Walk-in Clinic °If your illness is not likely to be very severe or complicated, you may want to try a walk in clinic. These are popping up all over the country in pharmacies, drugstores, and shopping centers. They're usually staffed by nurse practitioners or physician assistants that have been trained to treat common illnesses and complaints. They're usually fairly quick and inexpensive. However, if you have serious medical issues or chronic medical problems, these are probably not your best option. ° °No Primary Care Doctor: °- Call [facility] at  832-8000 - they can help you locate a primary care doctor that  accepts your insurance, provides certain services, etc. °- Physician Referral Service- 1-800-533-3463 ° °Chronic Pain Problems: °Organization         Address  Phone   Notes  °[facility]  ([phone]) [phone] Patients need to be referred by their primary care doctor.  ° °Medication  Assistance: °Organization         Address  Phone   Notes  °[place] [facility] [address] °[place], [place] [address] (336) 641-8030 --Must be a resident of [place] °-- Must have NO insurance coverage whatsoever (no Medicaid/ Medicare, etc.) °-- The pt. MUST have a primary care doctor that directs their care regularly and follows them in the community °  °[facility]  (866) 331-1348   °[employer]  (888) 892-1162   ° °Agencies that provide inexpensive medical care: °Organization         Address  Phone   Notes  °[facility]  (336) 832-8035   °[facility]    (336) 832-7272   °[facility] [address] °[place], [place] [address] (336) 832-4777   °[facility] [address], °[place] (336) 271-4999   °[facility]    (336) 373-0678   °[facility]    (336) 272-1050   °[facility] ° [address], [place] Phone:  (336) 832-4444, Fax:  (336) 832-4440 Hours of Operation:  9 am - 6 pm, M-F.  Also accepts Medicaid/Medicare and self-pay.  °[facility] ° [address],  Phone: (336) [phone], Fax: (336) [phone]. Hours of Operation:  8:30 am - 5:30 pm, M-F.  Also accepts Medicaid and self-pay.  °[facility] [place] [address], [place] Phone: (336) 878-6027   °[facility] [address], [place], [place] (336)723-1848, Ext. 123 Mondays & Thursdays: 7-9 AM.  First 15 patients are seen on a first come, first serve basis. °  ° °Medicaid-accepting [place] Providers: ° °Organization         Address  Phone   Notes  °[facility] [address], [place] (336) 641-2100 Also accepts self-pay patients.  °[facility] [address], [place] ° (336) 856-9996   °[facility] [address], [place]  (336) 288-8857   °[facility] [address] [place] [address], [place] (336) 299-7000   °Veita Bland [address], [place]  ° (336) 373-1557 Only accepts [place] Access Medicaid patients after they have their name applied to their card.  ° °Self-Pay (no insurance) in [place]: ° °Organization         Address  Phone   Notes  °Sickle Cell Patients, [facility] [address], [place] (336) 832-1970   °[facility] [address], [place] (336) 832-4400   °[facility] ° [address], [place] (336) 992-4800   °[facility]/Dr. Osei-Bonsu ° [address] [place] [address], [place] or [address], [place] (336) [phone] Phone number for both [place] and [place] locations is the same.  °[facility] [address], [place] (336) 299-0000   °[facility] [address] [place] [address], [place] or [address] (336) 852-7530 °(336) 878-2260   °[facility] [address], [place] (336) 350-1642, phone; (336) 294-5005, fax Sees patients 1st and 3rd Saturday of every month.  Must not qualify for public or private insurance (i.e. Medicaid, Medicare, [place] Health Choice, Veterans' Benefits) • Household income should be no more than 200% of the poverty level •The clinic cannot treat you if you are pregnant or think you are pregnant • Sexually transmitted diseases are not treated at the clinic.  ° ° °Dental Care: °Organization         Address  Phone  Notes  °[place] [facility] [address], [place] (336) 641-6152 Accepts children up to age 21 who are enrolled in Medicaid or [place] Health Choice; pregnant women with a Medicaid card; and children who have applied for Medicaid or [place] Health Choice, but were declined, whose parents can pay a reduced fee at time of service.  °[place]  [facility] [place]  [address], [place] (336) 641-7733 Accepts children up to age 21 who are enrolled in Medicaid or [place] Health Choice; pregnant women with a Medicaid card; and children who have applied for Medicaid or  Health Choice, but were declined, whose parents can pay a reduced fee at time of service.  °[facility] ° [address], [place] (336) 641-4533 Patients are seen by appointment only. Walk-ins are not accepted. [facility] will see patients 18 years of age and older. °Monday - Tuesday (8am-5pm) °Most Wednesdays (8:30-5pm) °$30 per visit, cash only  °[facility] ° [address], [place] (336) 641-4533 Patients are seen by appointment only. Walk-ins are not accepted. [facility] will see patients 18 years of age and older. °One   Wednesday Evening (Monthly: Volunteer Based).  $30 per visit, cash only  °[facility]  (919) 537-3737 for adults; Children under age 4, call Graduate Pediatric Dentistry at (919) [phone]. Children aged 4-14, please call (919) [phone] to request a pediatric application. ° Dental services are provided in all areas of dental care including fillings, crowns and bridges, complete and partial dentures, implants, gum treatment, root canals, and extractions. Preventive care is also provided. Treatment is provided to both adults and children. °Patients are selected via a lottery and there is often a waiting list. °  °[facility] [address], °[place] ° (336) 763-8833 [email or web address] °  °[facility] [address], [place], [place] (336)723-1848, Ext. 123 Second and Fourth Thursday of each month, opens at 6:30 AM; Clinic ends at 9 AM.  Patients are seen on a first-come first-served basis, and a limited number are seen during each clinic.  ° °[facility] ° [address], [place], [place] (336) 723-7904    Eligibility Requirements °You must have lived in [place], [place], or [place] counties for at least the last three months. °  You cannot be eligible for state or federal sponsored healthcare insurance, including Veterans Administration, Medicaid, or Medicare. °  You generally cannot be eligible for healthcare insurance through your employer.  °  How to apply: °Eligibility screenings are held every Tuesday and Wednesday afternoon from 1:00 pm until 4:00 pm. You do not need an appointment for the interview!  °[facility] [address], [place], [place] 336-631-2330   °[facility]  336-342-8273   °[facility]  336-703-3100   °[facility]  336-570-6415   ° °Behavioral Health Resources in the Community: °Intensive Outpatient Programs °Organization         Address  Phone  Notes  °[facility] [address], [place], [place] 336-878-6098   °[facility] [address], [place], [place] 336-832-9800   °[facility] [address], [place], [place] ° 336-882-2125   °[facility] [address],  °[place], [place] 1-800-853-5163 or 336-641-4981   °Substance Abuse Resources °Organization         Address  Phone  Notes  °[facility]  336-882-2125   °[facility]  336-784-9470   °[facility]  336-285-9073   °[facility]  336-845-3988   °Residential & Outpatient Substance Abuse Program  1-800-659-3381   °Psychological Services °Organization         Address  Phone  Notes  °[facility]  336- 832-9600   °[facility]  336- 378-7881   °[facility] [address], [place] 1-800-853-5163 or 336-641-4981   ° °Mobile Crisis Teams °Organization         Address  Phone  Notes  °[facility], Mobile Crisis Care Unit  1-877-626-1772   °Assertive °[facility] ° [address]  [place], [place] 336-834-9664   °Sharon DeEsch [address] °[place] 336-554-5454   ° °Self-Help/Support Groups °Organization         Address  Phone             Notes  °[facility] - variety of support groups  336- 373-1402 Call for more information  °Narcotics Anonymous (NA), [facility] [address], °[place]  2 meetings at this location  ° °  Residential Treatment Programs °Organization         Address  Phone  Notes  °[facility] [address],    °[place]  1-866-801-8205   °[facility] ° [address], [place], [place] 704-293-8524   °[facility] [address], [place] 336-845-3988 Admissions: 8am-3pm M-F  °[facility] [address]    °[place], [place] 336-841-1104   °[facility] [address], [place], [place] 336-379-7146   °[facility] [address]  °[place], [place] 336-285-9073   °[facility] - Intensive Outpatient [address], [place], [place] 336-852-3033   °[facility] [address]  °[place], [place] 1-877-615-2722 or 336-784-9470   °[facility] [address] [place], [place] 336-227-7417 Accepts Medicaid  °[facility] [address]  °[place] 1-800-659-3381 Substance Abuse/Addiction Treatment  ° °[place] Behavioral Health Resources °Organization         Address  Phone  Notes  °[facility]  (888) 581-9988   °Julie Brannon, PhD [address] [place], [place]   (336) 349-5553 or (336) 951-0000   °[facility]   [address] °[place], [place] (336) 349-4454   °[facility] [address], [place], [place] (336) 342-8316 Insurance/Medicaid/sponsorship through [facility]  °[facility] [address]                                    [place], [place] (336) 342-8316 Therapy/tele-psych/case    °[facility] [address]  ° [place],  (336) 349-2233    °Dr. Arfeen  (336) 349-4544   °[facility] [place]  [employer] [place] [facility] 1) [address], [place] °2) [address], [place] °3)  [address], [place] (336) 349-3220 °(336) 342-7768 ° °(336) 342-8140   °[place] [facility] (336) 342-1394 or (336) 342-3537 (After Hours)    ° ° °Take the prescriptions as directed.  Increase your fluid intake (ie:  Gatoraide) for the next few days.  Eat a bland diet and advance to your regular diet slowly as you can tolerate it.   Avoid full strength juices, as well as milk and milk products until your diarrhea has resolved.   Call your regular medical doctor tomorrow to schedule a follow up appointment in the next 2 days.  Return to the Emergency Department immediately if not improving (or even worsening) despite taking the medicines as prescribed, any black or bloody stool or vomit, if you develop a fever over "101," or for any other concerns. ° °

## 2015-10-04 ENCOUNTER — Telehealth: Payer: Self-pay | Admitting: *Deleted

## 2015-10-04 ENCOUNTER — Encounter: Payer: Self-pay | Admitting: Orthopedic Surgery

## 2015-10-04 NOTE — Telephone Encounter (Signed)
Routing to Dr Keeling for review 

## 2015-10-04 NOTE — Telephone Encounter (Signed)
Patient called requesting Norco to be refilled. Please advise [phone]

## 2015-10-05 MED ORDER — HYDROCODONE-ACETAMINOPHEN 7.5-325 MG PO TABS: 1.0000 | ORAL_TABLET | ORAL | Status: DC | PRN

## 2015-10-05 NOTE — Telephone Encounter (Signed)
Patient called, patient is aware that her rx is ready to be picked up.

## 2015-10-05 NOTE — Telephone Encounter (Signed)
Rx printed

## 2015-11-02 ENCOUNTER — Telehealth: Payer: Self-pay | Admitting: *Deleted

## 2015-11-02 ENCOUNTER — Ambulatory Visit (INDEPENDENT_AMBULATORY_CARE_PROVIDER_SITE_OTHER): Payer: Self-pay | Admitting: Orthopaedic Surgery

## 2015-11-02 VITALS — BP 98/59 | HR 69 | Temp 97.7°F | Ht 66.5 in | Wt 173.4 lb

## 2015-11-02 DIAGNOSIS — M545 Low back pain, unspecified: Secondary | ICD-10-CM | POA: Insufficient documentation

## 2015-11-02 MED ORDER — HYDROCODONE-ACETAMINOPHEN 7.5-325 MG PO TABS: 1.0000 | ORAL_TABLET | ORAL | Status: DC | PRN

## 2015-11-02 MED ORDER — DICLOFENAC SODIUM 75 MG PO TBEC: 75.0000 mg | DELAYED_RELEASE_TABLET | Freq: Two times a day (BID) | ORAL | Status: DC

## 2015-11-02 NOTE — Progress Notes (Signed)
Patient [name]:DTOIZTI D Mckain, female DOB:1971-05-01, 45 y.o. WPY:[id number]  Chief Complaint  Patient presents with  . Follow-up    back pain    HPI  [name] is a 45 y.o. female who has chronic back of the lower back with no paresthesias, no new trauma.  She is taking her medicine and doing her exercises.  HPI  Body mass index is 27.57 kg/(m^2).   Review of Systems  Constitutional:       Patient does not have Diabetes Mellitus. Patient does not have hypertension. Patient does not have COPD or shortness of breath. Patient does not have BMI > 35. Patient has current smoking history.  Musculoskeletal: Positive for back pain.    Past Medical History  Diagnosis Date  . Chronic back pain   . Arthritis     Past Surgical History  Procedure Laterality Date  . Replacement total knee bilateral    . Tubal ligation    . Cholecystectomy      No family history on file.  Social History Social History  Substance Use Topics  . Smoking status: Current Every Day Smoker -- 0.50 packs/day for 20 years    Types: Cigarettes  . Smokeless tobacco: Never Used  . Alcohol Use: No    Allergies  Allergen Reactions  . Penicillins Nausea And Vomiting    Current Outpatient Prescriptions  Medication Sig Dispense Refill  . ALPRAZolam (XANAX) 0.5 MG tablet Take 0.5 mg by mouth 2 (two) times daily.    . busPIRone (BUSPAR) 10 MG tablet Take 10 mg by mouth 3 (three) times daily.    . diclofenac (VOLTAREN) 75 MG EC tablet Take 1 tablet (75 mg total) by mouth 2 (two) times daily. 60 tablet 5  . HYDROcodone-acetaminophen (NORCO) 7.5-325 MG tablet Take 1 tablet by mouth every 4 (four) hours as needed for moderate pain (Must last 30 days.  Do not drive a call or operate machinery while on this medicine.). 120 tablet 0  . mirtazapine (REMERON) 30 MG tablet Take 30 mg by mouth at bedtime.    . bismuth subsalicylate (PEPTO BISMOL) 262 MG chewable tablet Chew 524 mg by mouth as needed for  indigestion or diarrhea or loose stools. Reported on 11/02/2015    . cyclobenzaprine (FLEXERIL) 10 MG tablet Take 1 tablet (10 mg total) by mouth 2 (two) times daily as needed for muscle spasms. (Patient not taking: Reported on 10/02/2014) 20 tablet 0  . dicyclomine (BENTYL) 20 MG tablet Take 1 tablet (20 mg total) by mouth every 6 (six) hours as needed for spasms (abdominal cramping). (Patient not taking: Reported on 11/02/2015) 15 tablet 0  . meloxicam (MOBIC) 7.5 MG tablet Take 1 tablet (7.5 mg total) by mouth daily. (Patient not taking: Reported on 10/02/2014) 10 tablet 0  . ondansetron (ZOFRAN) 4 MG tablet Take 1 tablet (4 mg total) by mouth every 8 (eight) hours as needed for nausea or vomiting. (Patient not taking: Reported on 11/02/2015) 6 tablet 0   No current facility-administered medications for this visit.     Physical Exam  Blood pressure 98/59, pulse 69, temperature 97.7 F (36.5 C), height 5' 6.5" (1.689 m), weight 173 lb 6.4 oz (78.654 kg).  Constitutional: overall normal hygiene, normal nutrition, well developed, normal grooming, normal body habitus. Assistive device:none  Musculoskeletal: gait and station Limp none, muscle tone and strength are normal, no tremors or atrophy is present.  .  Neurological: coordination overall normal.  Deep tendon reflex/nerve stretch intact.  Sensation  normal.  Cranial nerves II-XII intact.   Skin:   normal overall no scars, lesions, ulcers or rashes. No psoriasis.  Psychiatric: Alert and oriented x 3.  Recent memory intact, remote memory unclear.  Normal mood and affect. Well groomed.  Good eye contact.  Cardiovascular: overall no swelling, no varicosities, no edema bilaterally, normal temperatures of the legs and arms, no clubbing, cyanosis and good capillary refill.  Lymphatic: palpation is normal.  Spine/Pelvis examination:  Inspection:  Overall, sacoiliac joint benign and hips nontender; without crepitus or defects.   Thoracic spine  inspection: Alignment normal without kyphosis present   Lumbar spine inspection:  Alignment  with normal lumbar lordosis, without scoliosis apparent.   Thoracic spine palpation:  without tenderness of spinal processes   Lumbar spine palpation: with tenderness of lumbar area; without tightness of lumbar muscles    Range of Motion:   Lumbar flexion, forward flexion is 35  without pain or tenderness    Lumbar extension is 5  without pain or tenderness   Left lateral bend is Normal  without pain or tenderness   Right lateral bend is Normal without pain or tenderness   Straight leg raising is Normal   Strength & tone: Normal   Stability overall normal stability     The patient has been educated about the nature of the problem(s) and counseled on treatment options.  The patient appeared to understand what I have discussed and is in agreement with it.  PLAN Call if any problems.  Precautions discussed.  Continue current medications.   Return to clinic 3 months

## 2015-11-02 NOTE — Telephone Encounter (Signed)
Patient called stating she is having a problem filling her prescription that she received in today's visit. Please advise [phone].

## 2015-11-03 NOTE — Telephone Encounter (Signed)
SPOKE WITH PATIENT AND PHARMACY FILLED DICLOFENAC.

## 2015-11-29 ENCOUNTER — Telehealth: Payer: Self-pay | Admitting: *Deleted

## 2015-11-29 MED ORDER — HYDROCODONE-ACETAMINOPHEN 7.5-325 MG PO TABS: 1.0000 | ORAL_TABLET | ORAL | Status: DC | PRN

## 2015-11-29 NOTE — Telephone Encounter (Signed)
Patient request refill on Hydrocodone 7.5/325 mg Frequency: Every 4 hours PRN for moderate pain  #120 tablets

## 2015-11-29 NOTE — Telephone Encounter (Signed)
Rx done. 

## 2016-01-01 ENCOUNTER — Telehealth: Payer: Self-pay

## 2016-01-01 NOTE — Telephone Encounter (Signed)
Patient called for refill of hydrocodone 7.5-325 quanity 120. Call Back # [phone].

## 2016-01-02 MED ORDER — HYDROCODONE-ACETAMINOPHEN 7.5-325 MG PO TABS: 1.0000 | ORAL_TABLET | ORAL | Status: DC | PRN

## 2016-01-02 NOTE — Telephone Encounter (Signed)
Rx Done . 

## 2016-02-01 ENCOUNTER — Ambulatory Visit (INDEPENDENT_AMBULATORY_CARE_PROVIDER_SITE_OTHER): Payer: Self-pay | Admitting: Orthopaedic Surgery

## 2016-02-01 ENCOUNTER — Encounter: Payer: Self-pay | Admitting: Orthopaedic Surgery

## 2016-02-01 VITALS — BP 96/60 | HR 67 | Temp 97.3°F | Ht 66.5 in | Wt 181.0 lb

## 2016-02-01 DIAGNOSIS — M545 Low back pain, unspecified: Secondary | ICD-10-CM

## 2016-02-01 MED ORDER — HYDROCODONE-ACETAMINOPHEN 7.5-325 MG PO TABS: 1.0000 | ORAL_TABLET | ORAL | Status: DC | PRN

## 2016-02-01 NOTE — Progress Notes (Signed)
Patient [name], female DOB:07-19-1971, 45 y.o. KWI:[id number]  Chief Complaint  Patient presents with  . Follow-up    back    HPI  [name] is a 45 y.o. female who has chronic lower back pain.  She is stable.  She has no paresthesias. She has no new trauma.  She is doing her exercises.  She is taking her medicine.  She has no bowel or bladder problems.  HPI  Body mass index is 28.78 kg/(m^2).  ROS  Review of Systems  Constitutional:       Patient does not have Diabetes Mellitus. Patient does not have hypertension. Patient does not have COPD or shortness of breath. Patient does not have BMI > 35. Patient has current smoking history.  HENT: Negative for congestion.   Respiratory: Negative for cough and shortness of breath.   Cardiovascular: Negative for chest pain and leg swelling.  Endocrine: Positive for cold intolerance.  Musculoskeletal: Positive for back pain and arthralgias.  Allergic/Immunologic: Negative for environmental allergies.    Past Medical History  Diagnosis Date  . Chronic back pain   . Arthritis     Past Surgical History  Procedure Laterality Date  . Replacement total knee bilateral    . Tubal ligation    . Cholecystectomy      No family history on file.  Social History Social History  Substance Use Topics  . Smoking status: Current Every Day Smoker -- 0.50 packs/day for 20 years    Types: Cigarettes  . Smokeless tobacco: Never Used  . Alcohol Use: No    Allergies  Allergen Reactions  . Penicillins Nausea And Vomiting    Current Outpatient Prescriptions  Medication Sig Dispense Refill  . ALPRAZolam (XANAX) 0.5 MG tablet Take 0.5 mg by mouth 2 (two) times daily.    Marland Kitchen bismuth subsalicylate (PEPTO BISMOL) 262 MG chewable tablet Chew 524 mg by mouth as needed for indigestion or diarrhea or loose stools. Reported on 11/02/2015    . busPIRone (BUSPAR) 10 MG tablet Take 10 mg by mouth 3 (three) times daily.    .  cyclobenzaprine (FLEXERIL) 10 MG tablet Take 1 tablet (10 mg total) by mouth 2 (two) times daily as needed for muscle spasms. 20 tablet 0  . diclofenac (VOLTAREN) 75 MG EC tablet Take 1 tablet (75 mg total) by mouth 2 (two) times daily. 60 tablet 5  . dicyclomine (BENTYL) 20 MG tablet Take 1 tablet (20 mg total) by mouth every 6 (six) hours as needed for spasms (abdominal cramping). 15 tablet 0  . HYDROcodone-acetaminophen (NORCO) 7.5-325 MG tablet Take 1 tablet by mouth every 4 (four) hours as needed for moderate pain (Must last 30 days.  Do not drive a call or operate machinery while on this medicine.). 120 tablet 0  . meloxicam (MOBIC) 7.5 MG tablet Take 1 tablet (7.5 mg total) by mouth daily. 10 tablet 0  . mirtazapine (REMERON) 30 MG tablet Take 30 mg by mouth at bedtime.    . ondansetron (ZOFRAN) 4 MG tablet Take 1 tablet (4 mg total) by mouth every 8 (eight) hours as needed for nausea or vomiting. 6 tablet 0   No current facility-administered medications for this visit.     Physical Exam  Blood pressure 96/60, pulse 67, temperature 97.3 F (36.3 C), height 5' 6.5" (1.689 m), weight 181 lb (82.101 kg).  Constitutional: overall normal hygiene, normal nutrition, well developed, normal grooming, normal body habitus. Assistive device:none  Musculoskeletal: gait and station  Limp none, muscle tone and strength are normal, no tremors or atrophy is present.  .  Neurological: coordination overall normal.  Deep tendon reflex/nerve stretch intact.  Sensation normal.  Cranial nerves II-XII intact.   Skin:   normal overall no scars, lesions, ulcers or rashes. No psoriasis.  Psychiatric: Alert and oriented x 3.  Recent memory intact, remote memory unclear.  Normal mood and affect. Well groomed.  Good eye contact.  Cardiovascular: overall no swelling, no varicosities, no edema bilaterally, normal temperatures of the legs and arms, no clubbing, cyanosis and good capillary refill.  Lymphatic:  palpation is normal.  Spine/Pelvis examination:  Inspection:  Overall, sacoiliac joint benign and hips nontender; without crepitus or defects.   Thoracic spine inspection: Alignment normal without kyphosis present   Lumbar spine inspection:  Alignment  with normal lumbar lordosis, without scoliosis apparent.   Thoracic spine palpation:  without tenderness of spinal processes   Lumbar spine palpation: with tenderness of lumbar area; without tightness of lumbar muscles    Range of Motion:   Lumbar flexion, forward flexion is 45  without pain or tenderness    Lumbar extension is 10  without pain or tenderness   Left lateral bend is Normal  without pain or tenderness   Right lateral bend is Normal without pain or tenderness   Straight leg raising is Normal   Strength & tone: Normal   Stability overall normal stability     The patient has been educated about the nature of the problem(s) and counseled on treatment options.  The patient appeared to understand what I have discussed and is in agreement with it.  No diagnosis found.  PLAN Call if any problems.  Precautions discussed.  Continue current medications.   Return to clinic 3 months   Electronically Signed Darreld Mclean, MD 6/1/20179:49 AM

## 2016-02-06 ENCOUNTER — Ambulatory Visit: Payer: Self-pay | Admitting: Orthopaedic Surgery

## 2016-02-08 NOTE — Addendum Note (Signed)
Addended by: Earnstine Regal on: 02/08/2016 05:46 PM   Modules accepted: Level of Service

## 2016-02-28 ENCOUNTER — Telehealth: Payer: Self-pay | Admitting: Orthopaedic Surgery

## 2016-02-28 MED ORDER — HYDROCODONE-ACETAMINOPHEN 7.5-325 MG PO TABS: 1.0000 | ORAL_TABLET | ORAL | Status: DC | PRN

## 2016-02-28 NOTE — Telephone Encounter (Signed)
Rx done. 

## 2016-02-28 NOTE — Telephone Encounter (Signed)
Patient called and requested a refill on Hydrocodone-Acetaminophen (Norco) 7.5-325 mgs.  Qty  120    Sig: Take 1 tablet by mouth every 4 (four) hours as needed for moderate pain (Must last 30 days. Do not drive a call or operate machinery while on this medicine.).

## 2016-03-27 ENCOUNTER — Telehealth: Payer: Self-pay | Admitting: Orthopaedic Surgery

## 2016-03-27 MED ORDER — HYDROCODONE-ACETAMINOPHEN 7.5-325 MG PO TABS: 1.0000 | ORAL_TABLET | ORAL | 0 refills | Status: DC | PRN

## 2016-03-27 NOTE — Telephone Encounter (Signed)
Hydrocodone-Acetaminophen 7.5/325mg Qty 120 Tablets °

## 2016-04-30 ENCOUNTER — Ambulatory Visit (INDEPENDENT_AMBULATORY_CARE_PROVIDER_SITE_OTHER): Payer: Self-pay | Admitting: Orthopaedic Surgery

## 2016-04-30 ENCOUNTER — Encounter: Payer: Self-pay | Admitting: Orthopaedic Surgery

## 2016-04-30 VITALS — BP 110/63 | HR 62 | Ht 66.5 in | Wt 186.0 lb

## 2016-04-30 DIAGNOSIS — M545 Low back pain, unspecified: Secondary | ICD-10-CM

## 2016-04-30 DIAGNOSIS — F17200 Nicotine dependence, unspecified, uncomplicated: Secondary | ICD-10-CM

## 2016-04-30 DIAGNOSIS — F17210 Nicotine dependence, cigarettes, uncomplicated: Secondary | ICD-10-CM

## 2016-04-30 MED ORDER — HYDROCODONE-ACETAMINOPHEN 7.5-325 MG PO TABS: 1.0000 | ORAL_TABLET | Freq: Four times a day (QID) | ORAL | 0 refills | Status: DC | PRN

## 2016-04-30 NOTE — Patient Instructions (Addendum)

## 2016-04-30 NOTE — Progress Notes (Signed)
Patient [name], female DOB:09-23-1970, 45 y.o. WVP:[id number]  Chief Complaint  Patient presents with  . Follow-up    Back pain    HPI  [name] is a 45 y.o. female who has chronic lower back pain.  She has no paresthesias. She has no new trauma. She is doing her exercises.   HPI  Body mass index is 29.57 kg/m.  ROS  Review of Systems  Constitutional:       Patient does not have Diabetes Mellitus. Patient does not have hypertension. Patient does not have COPD or shortness of breath. Patient does not have BMI > 35. Patient has current smoking history.  HENT: Negative for congestion.   Respiratory: Negative for cough and shortness of breath.   Cardiovascular: Negative for chest pain and leg swelling.  Endocrine: Positive for cold intolerance.  Musculoskeletal: Positive for arthralgias and back pain.  Allergic/Immunologic: Negative for environmental allergies.    Past Medical History:  Diagnosis Date  . Arthritis   . Chronic back pain     Past Surgical History:  Procedure Laterality Date  . CHOLECYSTECTOMY    . REPLACEMENT TOTAL KNEE BILATERAL    . TUBAL LIGATION      History reviewed. No pertinent family history.  Social History Social History  Substance Use Topics  . Smoking status: Current Every Day Smoker    Packs/day: 0.50    Years: 20.00    Types: Cigarettes  . Smokeless tobacco: Never Used  . Alcohol use No    Allergies  Allergen Reactions  . Penicillins Nausea And Vomiting    Current Outpatient Prescriptions  Medication Sig Dispense Refill  . ALPRAZolam (XANAX) 0.5 MG tablet Take 0.5 mg by mouth 2 (two) times daily.    Marland Kitchen bismuth subsalicylate (PEPTO BISMOL) 262 MG chewable tablet Chew 524 mg by mouth as needed for indigestion or diarrhea or loose stools. Reported on 11/02/2015    . busPIRone (BUSPAR) 10 MG tablet Take 10 mg by mouth 3 (three) times daily.    . cyclobenzaprine (FLEXERIL) 10 MG tablet Take 1 tablet (10 mg  total) by mouth 2 (two) times daily as needed for muscle spasms. 20 tablet 0  . diclofenac (VOLTAREN) 75 MG EC tablet Take 1 tablet (75 mg total) by mouth 2 (two) times daily. 60 tablet 5  . dicyclomine (BENTYL) 20 MG tablet Take 1 tablet (20 mg total) by mouth every 6 (six) hours as needed for spasms (abdominal cramping). 15 tablet 0  . HYDROcodone-acetaminophen (NORCO) 7.5-325 MG tablet Take 1 tablet by mouth every 6 (six) hours as needed for moderate pain (Must last 30 days.Do not drive a call or operate machinery while on this medicine.). 110 tablet 0  . meloxicam (MOBIC) 7.5 MG tablet Take 1 tablet (7.5 mg total) by mouth daily. 10 tablet 0  . mirtazapine (REMERON) 30 MG tablet Take 30 mg by mouth at bedtime.    . ondansetron (ZOFRAN) 4 MG tablet Take 1 tablet (4 mg total) by mouth every 8 (eight) hours as needed for nausea or vomiting. 6 tablet 0   No current facility-administered medications for this visit.      Physical Exam  Blood pressure 110/63, pulse 62, height 5' 6.5" (1.689 m), weight 186 lb (84.4 kg).  Constitutional: overall normal hygiene, normal nutrition, well developed, normal grooming, normal body habitus. Assistive device:none  Musculoskeletal: gait and station Limp none, muscle tone and strength are normal, no tremors or atrophy is present.  .  Neurological:  coordination overall normal.  Deep tendon reflex/nerve stretch intact.  Sensation normal.  Cranial nerves II-XII intact.   Skin:   normal overall no scars, lesions, ulcers or rashes. No psoriasis.  Psychiatric: Alert and oriented x 3.  Recent memory intact, remote memory unclear.  Normal mood and affect. Well groomed.  Good eye contact.  Cardiovascular: overall no swelling, no varicosities, no edema bilaterally, normal temperatures of the legs and arms, no clubbing, cyanosis and good capillary refill.  Lymphatic: palpation is normal.  She has good ROM of the lower back. She has negative SLR.  NV intact.   Gait normal.  She is not willing to cut back or stop smoking.   The patient has been educated about the nature of the problem(s) and counseled on treatment options.  The patient appeared to understand what I have discussed and is in agreement with it.  Encounter Diagnoses  Name Primary?  . Midline low back pain without sciatica Yes  . Smoker unmotivated to quit     PLAN Call if any problems.  Precautions discussed.  Continue current medications.   Return to clinic 3 months   Electronically Signed Darreld Mclean, MD 8/29/20179:53 AM

## 2016-05-07 ENCOUNTER — Ambulatory Visit: Payer: Self-pay | Admitting: Orthopaedic Surgery

## 2016-05-28 ENCOUNTER — Telehealth: Payer: Self-pay | Admitting: Orthopaedic Surgery

## 2016-05-28 NOTE — Telephone Encounter (Signed)
HYDROcodone-acetaminophen (NORCO) 7.5-325 MG tablet 110 tablet   - patient requests refill.  No insurance.

## 2016-05-29 ENCOUNTER — Encounter: Payer: Self-pay | Admitting: Orthopaedic Surgery

## 2016-05-29 MED ORDER — HYDROCODONE-ACETAMINOPHEN 7.5-325 MG PO TABS: 1.0000 | ORAL_TABLET | Freq: Four times a day (QID) | ORAL | 0 refills | Status: DC | PRN

## 2016-06-25 ENCOUNTER — Telehealth: Payer: Self-pay | Admitting: Orthopaedic Surgery

## 2016-06-26 MED ORDER — HYDROCODONE-ACETAMINOPHEN 7.5-325 MG PO TABS: 1.0000 | ORAL_TABLET | Freq: Four times a day (QID) | ORAL | 0 refills | Status: DC | PRN

## 2016-07-24 ENCOUNTER — Ambulatory Visit (INDEPENDENT_AMBULATORY_CARE_PROVIDER_SITE_OTHER): Payer: Self-pay | Admitting: Orthopaedic Surgery

## 2016-07-24 VITALS — BP 98/65 | HR 76 | Temp 97.7°F

## 2016-07-24 DIAGNOSIS — M545 Low back pain: Secondary | ICD-10-CM

## 2016-07-24 DIAGNOSIS — G8929 Other chronic pain: Secondary | ICD-10-CM

## 2016-07-24 MED ORDER — HYDROCODONE-ACETAMINOPHEN 7.5-325 MG PO TABS: 1.0000 | ORAL_TABLET | Freq: Four times a day (QID) | ORAL | 0 refills | Status: DC | PRN

## 2016-07-24 NOTE — Patient Instructions (Signed)
Steps to Quit Smoking Smoking tobacco can be bad for your health. It can also affect almost every organ in your body. Smoking puts you and people around you at risk for many serious long-lasting (chronic) diseases. Quitting smoking is hard, but it is one of the best things that you can do for your health. It is never too late to quit. What are the benefits of quitting smoking? When you quit smoking, you lower your risk for getting serious diseases and conditions. They can include:  Lung cancer or lung disease.  Heart disease.  Stroke.  Heart attack.  Not being able to have children (infertility).  Weak bones (osteoporosis) and broken bones (fractures). If you have coughing, wheezing, and shortness of breath, those symptoms may get better when you quit. You may also get sick less often. If you are pregnant, quitting smoking can help to lower your chances of having a baby of low birth weight. What can I do to help me quit smoking? Talk with your doctor about what can help you quit smoking. Some things you can do (strategies) include:  Quitting smoking totally, instead of slowly cutting back how much you smoke over a period of time.  Going to in-person counseling. You are more likely to quit if you go to many counseling sessions.  Using resources and support systems, such as:  Online chats with a counselor.  Phone quitlines.  Printed self-help materials.  Support groups or group counseling.  Text messaging programs.  Mobile phone apps or applications.  Taking medicines. Some of these medicines may have nicotine in them. If you are pregnant or breastfeeding, do not take any medicines to quit smoking unless your doctor says it is okay. Talk with your doctor about counseling or other things that can help you. Talk with your doctor about using more than one strategy at the same time, such as taking medicines while you are also going to in-person counseling. This can help make quitting  easier. What things can I do to make it easier to quit? Quitting smoking might feel very hard at first, but there is a lot that you can do to make it easier. Take these steps:  Talk to your family and friends. Ask them to support and encourage you.  Call phone quitlines, reach out to support groups, or work with a counselor.  Ask people who smoke to not smoke around you.  Avoid places that make you want (trigger) to smoke, such as:  Bars.  Parties.  Smoke-break areas at work.  Spend time with people who do not smoke.  Lower the stress in your life. Stress can make you want to smoke. Try these things to help your stress:  Getting regular exercise.  Deep-breathing exercises.  Yoga.  Meditating.  Doing a body scan. To do this, close your eyes, focus on one area of your body at a time from head to toe, and notice which parts of your body are tense. Try to relax the muscles in those areas.  Download or buy apps on your mobile phone or tablet that can help you stick to your quit plan. There are many free apps, such as QuitGuide from the CDC (Centers for Disease Control and Prevention). You can find more support from smokefree.gov and other websites. This information is not intended to replace advice given to you by your health care provider. Make sure you discuss any questions you have with your health care provider. Document Released: 06/15/2009 Document Revised: 04/16/2016 Document   Reviewed: 01/03/2015 Elsevier Interactive Patient Education  2017 [employer]  

## 2016-07-24 NOTE — Progress Notes (Signed)
Patient [name], female DOB:1971/03/15, 45 y.o. EVO:[id number]  Chief Complaint  Patient presents with  . Follow-up    back pain    HPI  [name] is a 45 y.o. female who has chronic lower back pain that is stable. She has no new trauma, no paresthesias. She is doing her exercises and taking her medicine. HPI  There is no height or weight on file to calculate BMI.  ROS  Review of Systems  Past Medical History:  Diagnosis Date  . Arthritis   . Chronic back pain     Past Surgical History:  Procedure Laterality Date  . CHOLECYSTECTOMY    . REPLACEMENT TOTAL KNEE BILATERAL    . TUBAL LIGATION      No family history on file.  Social History Social History  Substance Use Topics  . Smoking status: Current Every Day Smoker    Packs/day: 0.50    Years: 20.00    Types: Cigarettes  . Smokeless tobacco: Never Used  . Alcohol use No    Allergies  Allergen Reactions  . Penicillins Nausea And Vomiting    Current Outpatient Prescriptions  Medication Sig Dispense Refill  . ALPRAZolam (XANAX) 0.5 MG tablet Take 0.5 mg by mouth 2 (two) times daily.    Marland Kitchen bismuth subsalicylate (PEPTO BISMOL) 262 MG chewable tablet Chew 524 mg by mouth as needed for indigestion or diarrhea or loose stools. Reported on 11/02/2015    . busPIRone (BUSPAR) 10 MG tablet Take 10 mg by mouth 3 (three) times daily.    . cyclobenzaprine (FLEXERIL) 10 MG tablet Take 1 tablet (10 mg total) by mouth 2 (two) times daily as needed for muscle spasms. 20 tablet 0  . diclofenac (VOLTAREN) 75 MG EC tablet Take 1 tablet (75 mg total) by mouth 2 (two) times daily. 60 tablet 5  . dicyclomine (BENTYL) 20 MG tablet Take 1 tablet (20 mg total) by mouth every 6 (six) hours as needed for spasms (abdominal cramping). 15 tablet 0  . HYDROcodone-acetaminophen (NORCO) 7.5-325 MG tablet Take 1 tablet by mouth every 6 (six) hours as needed for moderate pain (Must last 30 days.Do not drive a call or operate  machinery while on this medicine.). 90 tablet 0  . meloxicam (MOBIC) 7.5 MG tablet Take 1 tablet (7.5 mg total) by mouth daily. 10 tablet 0  . mirtazapine (REMERON) 30 MG tablet Take 30 mg by mouth at bedtime.    . ondansetron (ZOFRAN) 4 MG tablet Take 1 tablet (4 mg total) by mouth every 8 (eight) hours as needed for nausea or vomiting. 6 tablet 0   No current facility-administered medications for this visit.      Physical Exam  Blood pressure 98/65, pulse 76, temperature 97.7 F (36.5 C).  Constitutional: overall normal hygiene, normal nutrition, well developed, normal grooming, normal body habitus. Assistive device:none  Musculoskeletal: gait and station Limp none, muscle tone and strength are normal, no tremors or atrophy is present.  .  Neurological: coordination overall normal.  Deep tendon reflex/nerve stretch intact.  Sensation normal.  Cranial nerves II-XII intact.   Skin:   Normal overall no scars, lesions, ulcers or rashes. No psoriasis.  Psychiatric: Alert and oriented x 3.  Recent memory intact, remote memory unclear.  Normal mood and affect. Well groomed.  Good eye contact.  Cardiovascular: overall no swelling, no varicosities, no edema bilaterally, normal temperatures of the legs and arms, no clubbing, cyanosis and good capillary refill.  Lymphatic: palpation is normal.  Spine/Pelvis examination:  Inspection:  Overall, sacoiliac joint benign and hips nontender; without crepitus or defects.   Thoracic spine inspection: Alignment normal without kyphosis present   Lumbar spine inspection:  Alignment  with normal lumbar lordosis, without scoliosis apparent.   Thoracic spine palpation:  without tenderness of spinal processes   Lumbar spine palpation: with tenderness of lumbar area; without tightness of lumbar muscles    Range of Motion:   Lumbar flexion, forward flexion is 45 without pain or tenderness    Lumbar extension is full without pain or tenderness   Left  lateral bend is Normal  without pain or tenderness   Right lateral bend is Normal without pain or tenderness   Straight leg raising is Normal   Strength & tone: Normal   Stability overall normal stability     The patient has been educated about the nature of the problem(s) and counseled on treatment options.  The patient appeared to understand what I have discussed and is in agreement with it.  Encounter Diagnosis  Name Primary?  . Chronic midline low back pain without sciatica Yes    PLAN Call if any problems.  Precautions discussed.  Continue current medications.   Return to clinic 3 months   Electronically Signed Darreld Mclean, MD 11/22/20178:48 AM

## 2016-07-31 ENCOUNTER — Ambulatory Visit: Payer: Self-pay | Admitting: Orthopaedic Surgery

## 2016-08-20 ENCOUNTER — Telehealth: Payer: Self-pay | Admitting: Orthopaedic Surgery

## 2016-08-20 MED ORDER — HYDROCODONE-ACETAMINOPHEN 7.5-325 MG PO TABS: 1.0000 | ORAL_TABLET | Freq: Four times a day (QID) | ORAL | 0 refills | Status: DC | PRN

## 2016-09-18 ENCOUNTER — Telehealth: Payer: Self-pay | Admitting: Orthopaedic Surgery

## 2016-09-19 ENCOUNTER — Other Ambulatory Visit: Payer: Self-pay | Admitting: Orthopaedic Surgery

## 2016-09-20 MED ORDER — HYDROCODONE-ACETAMINOPHEN 7.5-325 MG PO TABS: 1.0000 | ORAL_TABLET | Freq: Four times a day (QID) | ORAL | 0 refills | Status: DC | PRN

## 2016-10-22 ENCOUNTER — Ambulatory Visit (INDEPENDENT_AMBULATORY_CARE_PROVIDER_SITE_OTHER): Payer: Self-pay | Admitting: Orthopaedic Surgery

## 2016-10-22 VITALS — BP 97/65 | HR 77 | Temp 97.3°F | Ht 66.5 in | Wt 187.0 lb

## 2016-10-22 DIAGNOSIS — M545 Low back pain: Secondary | ICD-10-CM

## 2016-10-22 DIAGNOSIS — G8929 Other chronic pain: Secondary | ICD-10-CM

## 2016-10-22 MED ORDER — HYDROCODONE-ACETAMINOPHEN 7.5-325 MG PO TABS: 1.0000 | ORAL_TABLET | Freq: Four times a day (QID) | ORAL | 0 refills | Status: DC | PRN

## 2016-10-22 NOTE — Patient Instructions (Signed)
Back Exercises Introduction If you have pain in your back, do these exercises 2-3 times each day or as told by your doctor. When the pain goes away, do the exercises once each day, but repeat the steps more times for each exercise (do more repetitions). If you do not have pain in your back, do these exercises once each day or as told by your doctor. Exercises Single Knee to Chest  Do these steps 3-5 times in a row for each leg: 1. Lie on your back on a firm bed or the floor with your legs stretched out. 2. Bring one knee to your chest. 3. Hold your knee to your chest by grabbing your knee or thigh. 4. Pull on your knee until you feel a gentle stretch in your lower back. 5. Keep doing the stretch for 10-30 seconds. 6. Slowly let go of your leg and straighten it. Pelvic Tilt  Do these steps 5-10 times in a row: 1. Lie on your back on a firm bed or the floor with your legs stretched out. 2. Bend your knees so they point up to the ceiling. Your feet should be flat on the floor. 3. Tighten your lower belly (abdomen) muscles to press your lower back against the floor. This will make your tailbone point up to the ceiling instead of pointing down to your feet or the floor. 4. Stay in this position for 5-10 seconds while you gently tighten your muscles and breathe evenly. Cat-Cow  Do these steps until your lower back bends more easily: 1. Get on your hands and knees on a firm surface. Keep your hands under your shoulders, and keep your knees under your hips. You may put padding under your knees. 2. Let your head hang down, and make your tailbone point down to the floor so your lower back is round like the back of a cat. 3. Stay in this position for 5 seconds. 4. Slowly lift your head and make your tailbone point up to the ceiling so your back hangs low (sags) like the back of a cow. 5. Stay in this position for 5 seconds. Press-Ups  Do these steps 5-10 times in a row: 1. Lie on your belly  (face-down) on the floor. 2. Place your hands near your head, about shoulder-width apart. 3. While you keep your back relaxed and keep your hips on the floor, slowly straighten your arms to raise the top half of your body and lift your shoulders. Do not use your back muscles. To make yourself more comfortable, you may change where you place your hands. 4. Stay in this position for 5 seconds. 5. Slowly return to lying flat on the floor. Bridges  Do these steps 10 times in a row: 1. Lie on your back on a firm surface. 2. Bend your knees so they point up to the ceiling. Your feet should be flat on the floor. 3. Tighten your butt muscles and lift your butt off of the floor until your waist is almost as high as your knees. If you do not feel the muscles working in your butt and the back of your thighs, slide your feet 1-2 inches farther away from your butt. 4. Stay in this position for 3-5 seconds. 5. Slowly lower your butt to the floor, and let your butt muscles relax. If this exercise is too easy, try doing it with your arms crossed over your chest. Belly Crunches  Do these steps 5-10 times in a row: 1. Lie   on your back on a firm bed or the floor with your legs stretched out. 2. Bend your knees so they point up to the ceiling. Your feet should be flat on the floor. 3. Cross your arms over your chest. 4. Tip your chin a little bit toward your chest but do not bend your neck. 5. Tighten your belly muscles and slowly raise your chest just enough to lift your shoulder blades a tiny bit off of the floor. 6. Slowly lower your chest and your head to the floor. Back Lifts  Do these steps 5-10 times in a row: 1. Lie on your belly (face-down) with your arms at your sides, and rest your forehead on the floor. 2. Tighten the muscles in your legs and your butt. 3. Slowly lift your chest off of the floor while you keep your hips on the floor. Keep the back of your head in line with the curve in your back.  Look at the floor while you do this. 4. Stay in this position for 3-5 seconds. 5. Slowly lower your chest and your face to the floor. Contact a doctor if:  Your back pain gets a lot worse when you do an exercise.  Your back pain does not lessen 2 hours after you exercise. If you have any of these problems, stop doing the exercises. Do not do them again unless your doctor says it is okay. Get help right away if:  You have sudden, very bad back pain. If this happens, stop doing the exercises. Do not do them again unless your doctor says it is okay. This information is not intended to replace advice given to you by your health care provider. Make sure you discuss any questions you have with your health care provider. Document Released: 09/21/2010 Document Revised: 01/25/2016 Document Reviewed: 10/13/2014  2017 [employer]  

## 2016-10-22 NOTE — Progress Notes (Signed)
Patient [name], female DOB:09/14/1970, 46 y.o. QQV:[id number]  Chief Complaint  Patient presents with  . Follow-up    Chronic back pain    HPI  [name] is a 46 y.o. female who has chronic lower back pain that has had more symptoms with the cold weather and the rainy days we have had.  She has no paresthesias, no trauma.  She is taking her medicine.  I will print out exercises for her. HPI  Body mass index is 29.73 kg/m.  ROS  Review of Systems  Constitutional:       Patient does not have Diabetes Mellitus. Patient does not have hypertension. Patient does not have COPD or shortness of breath. Patient does not have BMI > 35. Patient has current smoking history.  HENT: Negative for congestion.   Respiratory: Negative for cough and shortness of breath.   Cardiovascular: Negative for chest pain and leg swelling.  Endocrine: Positive for cold intolerance.  Musculoskeletal: Positive for arthralgias and back pain.  Allergic/Immunologic: Negative for environmental allergies.    Past Medical History:  Diagnosis Date  . Arthritis   . Chronic back pain     Past Surgical History:  Procedure Laterality Date  . CHOLECYSTECTOMY    . REPLACEMENT TOTAL KNEE BILATERAL    . TUBAL LIGATION      No family history on file.  Social History Social History  Substance Use Topics  . Smoking status: Current Every Day Smoker    Packs/day: 0.50    Years: 20.00    Types: Cigarettes  . Smokeless tobacco: Never Used  . Alcohol use No    Allergies  Allergen Reactions  . Penicillins Nausea And Vomiting    Current Outpatient Prescriptions  Medication Sig Dispense Refill  . ALPRAZolam (XANAX) 0.5 MG tablet Take 0.5 mg by mouth 2 (two) times daily.    Marland Kitchen bismuth subsalicylate (PEPTO BISMOL) 262 MG chewable tablet Chew 524 mg by mouth as needed for indigestion or diarrhea or loose stools. Reported on 11/02/2015    . busPIRone (BUSPAR) 10 MG tablet Take 10 mg by mouth 3  (three) times daily.    . cyclobenzaprine (FLEXERIL) 10 MG tablet Take 1 tablet (10 mg total) by mouth 2 (two) times daily as needed for muscle spasms. 20 tablet 0  . diclofenac (VOLTAREN) 75 MG EC tablet Take 1 tablet (75 mg total) by mouth 2 (two) times daily. 60 tablet 5  . dicyclomine (BENTYL) 20 MG tablet Take 1 tablet (20 mg total) by mouth every 6 (six) hours as needed for spasms (abdominal cramping). 15 tablet 0  . HYDROcodone-acetaminophen (NORCO) 7.5-325 MG tablet Take 1 tablet by mouth every 6 (six) hours as needed for moderate pain (Must last 30 days.Do not drive a call or operate machinery while on this medicine.). 75 tablet 0  . meloxicam (MOBIC) 7.5 MG tablet Take 1 tablet (7.5 mg total) by mouth daily. 10 tablet 0  . mirtazapine (REMERON) 30 MG tablet Take 30 mg by mouth at bedtime.    . ondansetron (ZOFRAN) 4 MG tablet Take 1 tablet (4 mg total) by mouth every 8 (eight) hours as needed for nausea or vomiting. 6 tablet 0   No current facility-administered medications for this visit.      Physical Exam  Blood pressure 97/65, pulse 77, temperature 97.3 F (36.3 C), height 5' 6.5" (1.689 m), weight 187 lb (84.8 kg).  Constitutional: overall normal hygiene, normal nutrition, well developed, normal grooming, normal body habitus.  Assistive device:none  Musculoskeletal: gait and station Limp none, muscle tone and strength are normal, no tremors or atrophy is present.  .  Neurological: coordination overall normal.  Deep tendon reflex/nerve stretch intact.  Sensation normal.  Cranial nerves II-XII intact.   Skin:   Normal overall no scars, lesions, ulcers or rashes. No psoriasis.  Psychiatric: Alert and oriented x 3.  Recent memory intact, remote memory unclear.  Normal mood and affect. Well groomed.  Good eye contact.  Cardiovascular: overall no swelling, no varicosities, no edema bilaterally, normal temperatures of the legs and arms, no clubbing, cyanosis and good capillary  refill.  Lymphatic: palpation is normal.  Spine/Pelvis examination:  Inspection:  Overall, sacoiliac joint benign and hips nontender; without crepitus or defects.   Thoracic spine inspection: Alignment normal without kyphosis present   Lumbar spine inspection:  Alignment  with normal lumbar lordosis, without scoliosis apparent.   Thoracic spine palpation:  without tenderness of spinal processes   Lumbar spine palpation: with tenderness of lumbar area; without tightness of lumbar muscles    Range of Motion:   Lumbar flexion, forward flexion is 45 without pain or tenderness    Lumbar extension is full without pain or tenderness   Left lateral bend is Normal  without pain or tenderness   Right lateral bend is Normal without pain or tenderness   Straight leg raising is Normal   Strength & tone: Normal   Stability overall normal stability    The patient has been educated about the nature of the problem(s) and counseled on treatment options.  The patient appeared to understand what I have discussed and is in agreement with it.  Encounter Diagnosis  Name Primary?  . Chronic midline low back pain without sciatica Yes    PLAN Call if any problems.  Precautions discussed.  Continue current medications.   Return to clinic 3 months   I have reviewed the [facility] Controlled Substance Reporting System web site prior to prescribing narcotic medicine for this patient.  Electronically Signed Darreld Mclean, MD 2/20/20188:58 AM

## 2016-10-23 ENCOUNTER — Ambulatory Visit: Payer: Self-pay | Admitting: Orthopaedic Surgery

## 2016-11-18 ENCOUNTER — Telehealth: Payer: Self-pay | Admitting: Orthopaedic Surgery

## 2016-11-18 ENCOUNTER — Other Ambulatory Visit: Payer: Self-pay | Admitting: Orthopaedic Surgery

## 2016-11-19 MED ORDER — HYDROCODONE-ACETAMINOPHEN 7.5-325 MG PO TABS: 1.0000 | ORAL_TABLET | Freq: Four times a day (QID) | ORAL | 0 refills | Status: DC | PRN

## 2016-12-17 ENCOUNTER — Other Ambulatory Visit: Payer: Self-pay | Admitting: Orthopaedic Surgery

## 2016-12-18 ENCOUNTER — Telehealth: Payer: Self-pay | Admitting: Orthopaedic Surgery

## 2016-12-18 MED ORDER — HYDROCODONE-ACETAMINOPHEN 7.5-325 MG PO TABS: 1.0000 | ORAL_TABLET | Freq: Four times a day (QID) | ORAL | 0 refills | Status: DC | PRN

## 2017-01-21 ENCOUNTER — Ambulatory Visit: Payer: Self-pay | Admitting: Orthopaedic Surgery

## 2017-01-22 ENCOUNTER — Encounter: Payer: Self-pay | Admitting: Orthopaedic Surgery

## 2017-01-22 ENCOUNTER — Ambulatory Visit (INDEPENDENT_AMBULATORY_CARE_PROVIDER_SITE_OTHER): Payer: BLUE CROSS/BLUE SHIELD | Admitting: Orthopaedic Surgery

## 2017-01-22 VITALS — BP 88/58 | HR 71 | Temp 97.7°F | Ht 66.5 in | Wt 187.0 lb

## 2017-01-22 DIAGNOSIS — M545 Low back pain, unspecified: Secondary | ICD-10-CM

## 2017-01-22 DIAGNOSIS — G8929 Other chronic pain: Secondary | ICD-10-CM

## 2017-01-22 MED ORDER — DICLOFENAC SODIUM 75 MG PO TBEC: 75.0000 mg | DELAYED_RELEASE_TABLET | Freq: Two times a day (BID) | ORAL | 5 refills | Status: DC

## 2017-01-22 MED ORDER — HYDROCODONE-ACETAMINOPHEN 7.5-325 MG PO TABS: 1.0000 | ORAL_TABLET | Freq: Four times a day (QID) | ORAL | 0 refills | Status: DC | PRN

## 2017-01-22 NOTE — Progress Notes (Signed)
88 [phone] 

## 2017-01-22 NOTE — Progress Notes (Signed)
Patient VZ:DGLOVFI D Samuelson, female DOB:15-Mar-1971, 46 y.o. EPP:[id number]  Chief Complaint  Patient presents with  . Follow-up    BACK PAIN    HPI  [name] is a 46 y.o. female who has chronic but stable lower back pain.  She has no paresthesias,no weakness, no new trauma.  She is active and doing her exercises and taking her medicine. HPI  Body mass index is 29.73 kg/m.  ROS  Review of Systems  Constitutional:       Patient does not have Diabetes Mellitus. Patient does not have hypertension. Patient does not have COPD or shortness of breath. Patient does not have BMI > 35. Patient has current smoking history.  HENT: Negative for congestion.   Respiratory: Negative for cough and shortness of breath.   Cardiovascular: Negative for chest pain and leg swelling.  Endocrine: Positive for cold intolerance.  Musculoskeletal: Positive for arthralgias and back pain.  Allergic/Immunologic: Negative for environmental allergies.    Past Medical History:  Diagnosis Date  . Arthritis   . Chronic back pain     Past Surgical History:  Procedure Laterality Date  . CHOLECYSTECTOMY    . REPLACEMENT TOTAL KNEE BILATERAL    . TUBAL LIGATION      No family history on file.  Social History Social History  Substance Use Topics  . Smoking status: Former Smoker    Packs/day: 0.50    Years: 20.00    Types: Cigarettes  . Smokeless tobacco: Never Used  . Alcohol use No    Allergies  Allergen Reactions  . Penicillins Nausea And Vomiting    Current Outpatient Prescriptions  Medication Sig Dispense Refill  . diclofenac (VOLTAREN) 75 MG EC tablet Take 1 tablet (75 mg total) by mouth 2 (two) times daily. 60 tablet 5  . HYDROcodone-acetaminophen (NORCO) 7.5-325 MG tablet Take 1 tablet by mouth every 6 (six) hours as needed for moderate pain (Must last 30 days.Do not drive a call or operate machinery while on this medicine.). 55 tablet 0   No current facility-administered  medications for this visit.      Physical Exam  Blood pressure (!) 88/58, pulse 71, temperature 97.7 F (36.5 C), height 5' 6.5" (1.689 m), weight 187 lb (84.8 kg).  Constitutional: overall normal hygiene, normal nutrition, well developed, normal grooming, normal body habitus. Assistive device:none  Musculoskeletal: gait and station Limp none, muscle tone and strength are normal, no tremors or atrophy is present.  .  Neurological: coordination overall normal.  Deep tendon reflex/nerve stretch intact.  Sensation normal.  Cranial nerves II-XII intact.   Skin:   Normal overall no scars, lesions, ulcers or rashes. No psoriasis.  Psychiatric: Alert and oriented x 3.  Recent memory intact, remote memory unclear.  Normal mood and affect. Well groomed.  Good eye contact.  Cardiovascular: overall no swelling, no varicosities, no edema bilaterally, normal temperatures of the legs and arms, no clubbing, cyanosis and good capillary refill.  Lymphatic: palpation is normal.  Spine/Pelvis examination:  Inspection:  Overall, sacoiliac joint benign and hips nontender; without crepitus or defects.   Thoracic spine inspection: Alignment normal without kyphosis present   Lumbar spine inspection:  Alignment  with normal lumbar lordosis, without scoliosis apparent.   Thoracic spine palpation:  without tenderness of spinal processes   Lumbar spine palpation: with tenderness of lumbar area; without tightness of lumbar muscles    Range of Motion:   Lumbar flexion, forward flexion is 45 without pain or tenderness  Lumbar extension is 10 without pain or tenderness   Left lateral bend is Normal  without pain or tenderness   Right lateral bend is Normal without pain or tenderness   Straight leg raising is Normal   Strength & tone: Normal   Stability overall normal stability     The patient has been educated about the nature of the problem(s) and counseled on treatment options.  The patient  appeared to understand what I have discussed and is in agreement with it.  Encounter Diagnosis  Name Primary?  . Chronic midline low back pain without sciatica Yes    PLAN Call if any problems.  Precautions discussed.  Continue current medications.   Return to clinic 3 months   I have reviewed the [facility] Controlled Substance Reporting System web site prior to prescribing narcotic medicine for this patient.  Electronically Signed Darreld Mclean, MD 5/23/20189:00 AM

## 2017-02-18 ENCOUNTER — Other Ambulatory Visit: Payer: Self-pay | Admitting: Orthopaedic Surgery

## 2017-02-19 ENCOUNTER — Telehealth: Payer: Self-pay | Admitting: Orthopaedic Surgery

## 2017-02-19 MED ORDER — HYDROCODONE-ACETAMINOPHEN 7.5-325 MG PO TABS: 1.0000 | ORAL_TABLET | Freq: Four times a day (QID) | ORAL | 0 refills | Status: DC | PRN

## 2017-03-19 ENCOUNTER — Telehealth: Payer: Self-pay | Admitting: Orthopaedic Surgery

## 2017-03-19 MED ORDER — HYDROCODONE-ACETAMINOPHEN 7.5-325 MG PO TABS: 1.0000 | ORAL_TABLET | Freq: Four times a day (QID) | ORAL | 0 refills | Status: DC | PRN

## 2017-04-24 ENCOUNTER — Ambulatory Visit (INDEPENDENT_AMBULATORY_CARE_PROVIDER_SITE_OTHER): Payer: BLUE CROSS/BLUE SHIELD | Admitting: Orthopaedic Surgery

## 2017-04-24 ENCOUNTER — Encounter: Payer: Self-pay | Admitting: Orthopaedic Surgery

## 2017-04-24 VITALS — BP 100/56 | HR 74 | Temp 98.2°F | Ht 66.5 in | Wt 186.0 lb

## 2017-04-24 DIAGNOSIS — M545 Low back pain: Secondary | ICD-10-CM

## 2017-04-24 DIAGNOSIS — G8929 Other chronic pain: Secondary | ICD-10-CM | POA: Diagnosis not present

## 2017-04-24 MED ORDER — HYDROCODONE-ACETAMINOPHEN 7.5-325 MG PO TABS: 1.0000 | ORAL_TABLET | Freq: Four times a day (QID) | ORAL | 0 refills | Status: DC | PRN

## 2017-04-24 NOTE — Progress Notes (Signed)
Patient [name], female DOB:April 23, 1971, 46 y.o. QZR:[id number]  Chief Complaint  Patient presents with  . Follow-up    back pain    HPI  [name] is a 46 y.o. female who has chronic lower back pain.  She is stable. She has more pain in rainy weather. She has no new trauma, no paresthesias, no weakness.  She is doing her exercises. HPI  Body mass index is 29.57 kg/m.  ROS  Review of Systems  Constitutional:       Patient does not have Diabetes Mellitus. Patient does not have hypertension. Patient does not have COPD or shortness of breath. Patient does not have BMI > 35. Patient has current smoking history.  HENT: Negative for congestion.   Respiratory: Negative for cough and shortness of breath.   Cardiovascular: Negative for chest pain and leg swelling.  Endocrine: Positive for cold intolerance.  Musculoskeletal: Positive for arthralgias and back pain.  Allergic/Immunologic: Negative for environmental allergies.    Past Medical History:  Diagnosis Date  . Arthritis   . Chronic back pain     Past Surgical History:  Procedure Laterality Date  . CHOLECYSTECTOMY    . REPLACEMENT TOTAL KNEE BILATERAL    . TUBAL LIGATION      No family history on file.  Social History Social History  Substance Use Topics  . Smoking status: Former Smoker    Packs/day: 0.50    Years: 20.00    Types: Cigarettes  . Smokeless tobacco: Never Used  . Alcohol use No    Allergies  Allergen Reactions  . Penicillins Nausea And Vomiting    Current Outpatient Prescriptions  Medication Sig Dispense Refill  . diclofenac (VOLTAREN) 75 MG EC tablet Take 1 tablet (75 mg total) by mouth 2 (two) times daily. 60 tablet 5  . HYDROcodone-acetaminophen (NORCO) 7.5-325 MG tablet Take 1 tablet by mouth every 6 (six) hours as needed for moderate pain (Must last 30 days.Do not drive a call or operate machinery while on this medicine.). 55 tablet 0   No current  facility-administered medications for this visit.      Physical Exam  Blood pressure (!) 100/56, pulse 74, temperature 98.2 F (36.8 C), height 5' 6.5" (1.689 m), weight 186 lb (84.4 kg).  Constitutional: overall normal hygiene, normal nutrition, well developed, normal grooming, normal body habitus. Assistive device:none  Musculoskeletal: gait and station Limp none, muscle tone and strength are normal, no tremors or atrophy is present.  .  Neurological: coordination overall normal.  Deep tendon reflex/nerve stretch intact.  Sensation normal.  Cranial nerves II-XII intact.   Skin:   Normal overall no scars, lesions, ulcers or rashes. No psoriasis.  Psychiatric: Alert and oriented x 3.  Recent memory intact, remote memory unclear.  Normal mood and affect. Well groomed.  Good eye contact.  Cardiovascular: overall no swelling, no varicosities, no edema bilaterally, normal temperatures of the legs and arms, no clubbing, cyanosis and good capillary refill.  Lymphatic: palpation is normal.  Spine/Pelvis examination:  Inspection:  Overall, sacoiliac joint benign and hips nontender; without crepitus or defects.   Thoracic spine inspection: Alignment normal without kyphosis present   Lumbar spine inspection:  Alignment  with normal lumbar lordosis, without scoliosis apparent.   Thoracic spine palpation:  without tenderness of spinal processes   Lumbar spine palpation: with tenderness of lumbar area; without tightness of lumbar muscles    Range of Motion:   Lumbar flexion, forward flexion is 45 without pain or  tenderness    Lumbar extension is 10 without pain or tenderness   Left lateral bend is Normal  without pain or tenderness   Right lateral bend is Normal without pain or tenderness   Straight leg raising is Normal   Strength & tone: Normal   Stability overall normal stability     The patient has been educated about the nature of the problem(s) and counseled on treatment  options.  The patient appeared to understand what I have discussed and is in agreement with it.  Encounter Diagnosis  Name Primary?  . Chronic midline low back pain without sciatica Yes    PLAN Call if any problems.  Precautions discussed.  Continue current medications.   Return to clinic 3 months   I have reviewed the [facility] Controlled Substance Reporting System web site prior to prescribing narcotic medicine for this patient.  Electronically Signed Darreld Mclean, MD 8/23/20189:17 AM

## 2017-05-19 ENCOUNTER — Telehealth: Payer: Self-pay | Admitting: Orthopaedic Surgery

## 2017-05-20 MED ORDER — HYDROCODONE-ACETAMINOPHEN 7.5-325 MG PO TABS: 1.0000 | ORAL_TABLET | Freq: Four times a day (QID) | ORAL | 0 refills | Status: DC | PRN

## 2017-06-18 ENCOUNTER — Telehealth: Payer: Self-pay | Admitting: Orthopaedic Surgery

## 2017-06-18 MED ORDER — HYDROCODONE-ACETAMINOPHEN 7.5-325 MG PO TABS: 1.0000 | ORAL_TABLET | Freq: Four times a day (QID) | ORAL | 0 refills | Status: DC | PRN

## 2017-07-17 ENCOUNTER — Encounter: Payer: Self-pay | Admitting: Orthopaedic Surgery

## 2017-07-17 ENCOUNTER — Ambulatory Visit: Payer: BLUE CROSS/BLUE SHIELD | Admitting: Orthopaedic Surgery

## 2017-07-17 VITALS — BP 130/60 | HR 68 | Temp 97.9°F | Ht 66.5 in | Wt 187.0 lb

## 2017-07-17 DIAGNOSIS — M545 Low back pain: Secondary | ICD-10-CM

## 2017-07-17 DIAGNOSIS — G8929 Other chronic pain: Secondary | ICD-10-CM

## 2017-07-17 MED ORDER — HYDROCODONE-ACETAMINOPHEN 7.5-325 MG PO TABS: 1.0000 | ORAL_TABLET | Freq: Four times a day (QID) | ORAL | 0 refills | Status: DC | PRN

## 2017-07-17 NOTE — Progress Notes (Signed)
Patient [name], female DOB:Jun 16, 1971, 46 y.o. EFE:[id number]  Chief Complaint  Patient presents with  . Back Pain    HPI  [name] is a 46 y.o. female who has chronic lower back pain.  She is stable.  She is doing her exercises. She has no paresthesias, no weakness.  She is taking her medicine. HPI  Body mass index is 29.73 kg/m.  ROS  Review of Systems  Constitutional:       Patient does not have Diabetes Mellitus. Patient does not have hypertension. Patient does not have COPD or shortness of breath. Patient does not have BMI > 35. Patient has current smoking history.  HENT: Negative for congestion.   Respiratory: Negative for cough and shortness of breath.   Cardiovascular: Negative for chest pain and leg swelling.  Endocrine: Positive for cold intolerance.  Musculoskeletal: Positive for arthralgias and back pain.  Allergic/Immunologic: Negative for environmental allergies.    Past Medical History:  Diagnosis Date  . Arthritis   . Chronic back pain     Past Surgical History:  Procedure Laterality Date  . CHOLECYSTECTOMY    . REPLACEMENT TOTAL KNEE BILATERAL    . TUBAL LIGATION      History reviewed. No pertinent family history.  Social History Social History   Tobacco Use  . Smoking status: Former Smoker    Packs/day: 0.50    Years: 20.00    Pack years: 10.00    Types: Cigarettes  . Smokeless tobacco: Never Used  Substance Use Topics  . Alcohol use: No  . Drug use: No    Allergies  Allergen Reactions  . Penicillins Nausea And Vomiting    Current Outpatient Medications  Medication Sig Dispense Refill  . HYDROcodone-acetaminophen (NORCO) 7.5-325 MG tablet Take 1 tablet every 6 (six) hours as needed by mouth for moderate pain (Must last 30 days.Do not drive a call or operate machinery while on this medicine.). 55 tablet 0  . diclofenac (VOLTAREN) 75 MG EC tablet Take 1 tablet (75 mg total) by mouth 2 (two) times daily.  (Patient not taking: Reported on 07/17/2017) 60 tablet 5   No current facility-administered medications for this visit.      Physical Exam  Blood pressure 130/60, pulse 68, temperature 97.9 F (36.6 C), height 5' 6.5" (1.689 m), weight 187 lb (84.8 kg).  Constitutional: overall normal hygiene, normal nutrition, well developed, normal grooming, normal body habitus. Assistive device:none  Musculoskeletal: gait and station Limp none, muscle tone and strength are normal, no tremors or atrophy is present.  .  Neurological: coordination overall normal.  Deep tendon reflex/nerve stretch intact.  Sensation normal.  Cranial nerves II-XII intact.   Skin:   Normal overall no scars, lesions, ulcers or rashes. No psoriasis.  Psychiatric: Alert and oriented x 3.  Recent memory intact, remote memory unclear.  Normal mood and affect. Well groomed.  Good eye contact.  Cardiovascular: overall no swelling, no varicosities, no edema bilaterally, normal temperatures of the legs and arms, no clubbing, cyanosis and good capillary refill.  Lymphatic: palpation is normal.  All other systems reviewed and are negative   Spine/Pelvis examination:  Inspection:  Overall, sacoiliac joint benign and hips nontender; without crepitus or defects.   Thoracic spine inspection: Alignment normal without kyphosis present   Lumbar spine inspection:  Alignment  with normal lumbar lordosis, without scoliosis apparent.   Thoracic spine palpation:  without tenderness of spinal processes   Lumbar spine palpation: without tenderness of lumbar area;  without tightness of lumbar muscles    Range of Motion:   Lumbar flexion, forward flexion is normal without pain or tenderness    Lumbar extension is full without pain or tenderness   Left lateral bend is normal without pain or tenderness   Right lateral bend is normal without pain or tenderness   Straight leg raising is normal  Strength & tone: normal   Stability overall  normal stability The patient has been educated about the nature of the problem(s) and counseled on treatment options.  The patient appeared to understand what I have discussed and is in agreement with it.  Encounter Diagnosis  Name Primary?  . Chronic midline low back pain without sciatica Yes    PLAN Call if any problems.  Precautions discussed.  Continue current medications.   Return to clinic 3 months   Electronically Signed Darreld Mclean, MD 11/15/20188:34 AM

## 2017-07-23 ENCOUNTER — Ambulatory Visit: Payer: BLUE CROSS/BLUE SHIELD | Admitting: Orthopaedic Surgery

## 2017-08-12 ENCOUNTER — Other Ambulatory Visit: Payer: Self-pay | Admitting: Orthopaedic Surgery

## 2017-08-13 ENCOUNTER — Other Ambulatory Visit: Payer: Self-pay | Admitting: Orthopaedic Surgery

## 2017-08-13 MED ORDER — HYDROCODONE-ACETAMINOPHEN 7.5-325 MG PO TABS: 1.0000 | ORAL_TABLET | Freq: Four times a day (QID) | ORAL | 0 refills | Status: DC | PRN

## 2017-09-09 ENCOUNTER — Other Ambulatory Visit: Payer: Self-pay | Admitting: Orthopaedic Surgery

## 2017-09-10 ENCOUNTER — Other Ambulatory Visit: Payer: Self-pay | Admitting: Orthopaedic Surgery

## 2017-09-10 NOTE — Telephone Encounter (Signed)
Too early

## 2017-09-10 NOTE — Telephone Encounter (Signed)
She asked for this yesterday, I did not fill as too early.  Too early today as well.  Need to wait until Tuesday.

## 2017-09-11 ENCOUNTER — Telehealth: Payer: Self-pay | Admitting: Radiology

## 2017-09-11 ENCOUNTER — Other Ambulatory Visit: Payer: Self-pay | Admitting: Orthopaedic Surgery

## 2017-09-11 NOTE — Telephone Encounter (Signed)
Called patient; relayed importance of calling back to our office, per Dr Sanjuan Dame response.

## 2017-09-11 NOTE — Telephone Encounter (Signed)
Patient returned call; voiced understanding. Will re-request when eligible for refill.

## 2017-09-11 NOTE — Telephone Encounter (Signed)
For the THIRD consecutive day in a row, not eligible for refill until the 15th, Tuesday.  She is calling every day.  Does not someone see this? Denied for today.

## 2017-09-11 NOTE — Telephone Encounter (Signed)
I called patient to advise too soon to refill the Hydrocodone.  She has sent multiple requests She states she only sent one request, her tablet does crazy things and must have sent the requests on its own.  I have advised her Dr [name] does not and will not refill the Hydrocodone early.   She voiced understanding.

## 2017-09-15 ENCOUNTER — Other Ambulatory Visit: Payer: Self-pay | Admitting: Orthopaedic Surgery

## 2017-09-16 MED ORDER — HYDROCODONE-ACETAMINOPHEN 7.5-325 MG PO TABS: 1.0000 | ORAL_TABLET | Freq: Four times a day (QID) | ORAL | 0 refills | Status: DC | PRN

## 2017-10-16 ENCOUNTER — Ambulatory Visit (INDEPENDENT_AMBULATORY_CARE_PROVIDER_SITE_OTHER): Payer: BLUE CROSS/BLUE SHIELD | Admitting: Orthopaedic Surgery

## 2017-10-16 ENCOUNTER — Encounter: Payer: Self-pay | Admitting: Orthopaedic Surgery

## 2017-10-16 VITALS — BP 124/74 | HR 60 | Temp 97.1°F | Ht 66.5 in | Wt 199.0 lb

## 2017-10-16 DIAGNOSIS — G8929 Other chronic pain: Secondary | ICD-10-CM | POA: Diagnosis not present

## 2017-10-16 DIAGNOSIS — M545 Low back pain, unspecified: Secondary | ICD-10-CM

## 2017-10-16 MED ORDER — HYDROCODONE-ACETAMINOPHEN 7.5-325 MG PO TABS: 1.0000 | ORAL_TABLET | Freq: Four times a day (QID) | ORAL | 0 refills | Status: DC | PRN

## 2017-10-16 MED ORDER — DICLOFENAC SODIUM 75 MG PO TBEC: 75.0000 mg | DELAYED_RELEASE_TABLET | Freq: Two times a day (BID) | ORAL | 5 refills | Status: DC

## 2017-10-16 NOTE — Progress Notes (Signed)
Patient [name] D Moscoso, female DOB:Nov 01, 1970, 47 y.o. YBW:[id number]  Chief Complaint  Patient presents with  . Back Pain    lumbar    HPI  [name] is a 47 y.o. female who has chronic lower back pain.  She has no sciatica.  She has good and bad days.  She has had more pain recently secondary to the cold days and all the rain.  She has no new trauma.  She is taking her medicine. HPI  Body mass index is 31.64 kg/m.  ROS  Review of Systems  Constitutional:       Patient does not have Diabetes Mellitus. Patient does not have hypertension. Patient does not have COPD or shortness of breath. Patient does not have BMI > 35. Patient has current smoking history.  HENT: Negative for congestion.   Respiratory: Negative for cough and shortness of breath.   Cardiovascular: Negative for chest pain and leg swelling.  Endocrine: Positive for cold intolerance.  Musculoskeletal: Positive for arthralgias and back pain.  Allergic/Immunologic: Negative for environmental allergies.  All other systems reviewed and are negative.   Past Medical History:  Diagnosis Date  . Arthritis   . Chronic back pain     Past Surgical History:  Procedure Laterality Date  . CHOLECYSTECTOMY    . REPLACEMENT TOTAL KNEE BILATERAL    . TUBAL LIGATION      History reviewed. No pertinent family history.  Social History Social History   Tobacco Use  . Smoking status: Former Smoker    Packs/day: 0.50    Years: 20.00    Pack years: 10.00    Types: Cigarettes  . Smokeless tobacco: Never Used  Substance Use Topics  . Alcohol use: No  . Drug use: No    Allergies  Allergen Reactions  . Penicillins Nausea And Vomiting    Current Outpatient Medications  Medication Sig Dispense Refill  . diclofenac (VOLTAREN) 75 MG EC tablet Take 1 tablet (75 mg total) by mouth 2 (two) times daily. 60 tablet 5  . HYDROcodone-acetaminophen (NORCO) 7.5-325 MG tablet Take 1 tablet by mouth every 6 (six)  hours as needed for moderate pain (Must last 30 days). 50 tablet 0   No current facility-administered medications for this visit.      Physical Exam  Blood pressure 124/74, pulse 60, temperature (!) 97.1 F (36.2 C), height 5' 6.5" (1.689 m), weight 199 lb (90.3 kg).  Constitutional: overall normal hygiene, normal nutrition, well developed, normal grooming, normal body habitus. Assistive device:none  Musculoskeletal: gait and station Limp none, muscle tone and strength are normal, no tremors or atrophy is present.  .  Neurological: coordination overall normal.  Deep tendon reflex/nerve stretch intact.  Sensation normal.  Cranial nerves II-XII intact.   Skin:   Normal overall no scars, lesions, ulcers or rashes. No psoriasis.  Psychiatric: Alert and oriented x 3.  Recent memory intact, remote memory unclear.  Normal mood and affect. Well groomed.  Good eye contact.  Cardiovascular: overall no swelling, no varicosities, no edema bilaterally, normal temperatures of the legs and arms, no clubbing, cyanosis and good capillary refill.  Lymphatic: palpation is normal.  Spine/Pelvis examination:  Inspection:  Overall, sacoiliac joint benign and hips nontender; without crepitus or defects.   Thoracic spine inspection: Alignment normal without kyphosis present   Lumbar spine inspection:  Alignment  with normal lumbar lordosis, without scoliosis apparent.   Thoracic spine palpation:  without tenderness of spinal processes   Lumbar spine palpation: without  tenderness of lumbar area; without tightness of lumbar muscles    Range of Motion:   Lumbar flexion, forward flexion is normal without pain or tenderness    Lumbar extension is full without pain or tenderness   Left lateral bend is normal without pain or tenderness   Right lateral bend is normal without pain or tenderness   Straight leg raising is normal  Strength & tone: normal   Stability overall normal stability All other  systems reviewed and are negative   The patient has been educated about the nature of the problem(s) and counseled on treatment options.  The patient appeared to understand what I have discussed and is in agreement with it.  Encounter Diagnosis  Name Primary?  . Chronic midline low back pain without sciatica Yes    PLAN Call if any problems.  Precautions discussed.  Continue current medications.   Return to clinic 3 months   I have reviewed the [facility] Controlled Substance Reporting System web site prior to prescribing narcotic medicine for this patient.  Electronically Signed Darreld Mclean, MD 2/14/20198:23 AM

## 2017-11-12 ENCOUNTER — Other Ambulatory Visit: Payer: Self-pay | Admitting: Orthopaedic Surgery

## 2017-11-12 MED ORDER — HYDROCODONE-ACETAMINOPHEN 7.5-325 MG PO TABS: 1.0000 | ORAL_TABLET | Freq: Four times a day (QID) | ORAL | 0 refills | Status: DC | PRN

## 2017-12-11 ENCOUNTER — Other Ambulatory Visit: Payer: Self-pay | Admitting: Orthopedic Surgery

## 2017-12-11 MED ORDER — HYDROCODONE-ACETAMINOPHEN 7.5-325 MG PO TABS: 1.0000 | ORAL_TABLET | Freq: Four times a day (QID) | ORAL | 0 refills | Status: DC | PRN

## 2018-01-13 ENCOUNTER — Ambulatory Visit (INDEPENDENT_AMBULATORY_CARE_PROVIDER_SITE_OTHER): Payer: BLUE CROSS/BLUE SHIELD | Admitting: Orthopaedic Surgery

## 2018-01-13 ENCOUNTER — Encounter: Payer: Self-pay | Admitting: Orthopaedic Surgery

## 2018-01-13 VITALS — BP 113/67 | HR 62 | Ht 66.5 in | Wt 199.0 lb

## 2018-01-13 DIAGNOSIS — M545 Low back pain: Secondary | ICD-10-CM | POA: Diagnosis not present

## 2018-01-13 DIAGNOSIS — G8929 Other chronic pain: Secondary | ICD-10-CM | POA: Diagnosis not present

## 2018-01-13 MED ORDER — HYDROCODONE-ACETAMINOPHEN 7.5-325 MG PO TABS: 1.0000 | ORAL_TABLET | Freq: Four times a day (QID) | ORAL | 0 refills | Status: DC | PRN

## 2018-01-13 NOTE — Progress Notes (Signed)
Patient WE:XHBZJIR [name], female DOB:[birth year], 47 y.o. CVE:[id number]  Chief Complaint  Patient presents with  . Follow-up    Low Back Pain    HPI  [name] is a 47 y.o. female who has chronic lower back pain. She has had increased pain the last two weeks.  She has no bowel or bladder problems.  She is taking her medicine.  I went over exercises with her again today. HPI  Body mass index is 31.64 kg/m.  ROS  Review of Systems  Constitutional:       Patient does not have Diabetes Mellitus. Patient does not have hypertension. Patient does not have COPD or shortness of breath. Patient does not have BMI > 35. Patient has current smoking history.  HENT: Negative for congestion.   Respiratory: Negative for cough and shortness of breath.   Cardiovascular: Negative for chest pain and leg swelling.  Endocrine: Positive for cold intolerance.  Musculoskeletal: Positive for arthralgias and back pain.  Allergic/Immunologic: Negative for environmental allergies.  All other systems reviewed and are negative.   Past Medical History:  Diagnosis Date  . Arthritis   . Chronic back pain     Past Surgical History:  Procedure Laterality Date  . CHOLECYSTECTOMY    . REPLACEMENT TOTAL KNEE BILATERAL    . TUBAL LIGATION      History reviewed. No pertinent family history.  Social History Social History   Tobacco Use  . Smoking status: Former Smoker    Packs/day: 0.50    Years: 20.00    Pack years: 10.00    Types: Cigarettes  . Smokeless tobacco: Never Used  Substance Use Topics  . Alcohol use: No  . Drug use: No    Allergies  Allergen Reactions  . Penicillins Nausea And Vomiting    Current Outpatient Medications  Medication Sig Dispense Refill  . diclofenac (VOLTAREN) 75 MG EC tablet Take 1 tablet (75 mg total) by mouth 2 (two) times daily. 60 tablet 5  . HYDROcodone-acetaminophen (NORCO) 7.5-325 MG tablet Take 1 tablet by mouth every 6 (six) hours as needed  for moderate pain (Must last 30 days). 50 tablet 0   No current facility-administered medications for this visit.      Physical Exam  Blood pressure 113/67, pulse 62, height 5' 6.5" (1.689 m), weight 199 lb (90.3 kg).  Constitutional: overall normal hygiene, normal nutrition, well developed, normal grooming, normal body habitus. Assistive device:none  Musculoskeletal: gait and station Limp none, muscle tone and strength are normal, no tremors or atrophy is present.  .  Neurological: coordination overall normal.  Deep tendon reflex/nerve stretch intact.  Sensation normal.  Cranial nerves II-XII intact.   Skin:   Normal overall no scars, lesions, ulcers or rashes. No psoriasis.  Psychiatric: Alert and oriented x 3.  Recent memory intact, remote memory unclear.  Normal mood and affect. Well groomed.  Good eye contact.  Cardiovascular: overall no swelling, no varicosities, no edema bilaterally, normal temperatures of the legs and arms, no clubbing, cyanosis and good capillary refill.  Lymphatic: palpation is normal.  Spine/Pelvis examination:  Inspection:  Overall, sacoiliac joint benign and hips nontender; without crepitus or defects.   Thoracic spine inspection: Alignment normal without kyphosis present   Lumbar spine inspection:  Alignment  with normal lumbar lordosis, without scoliosis apparent.   Thoracic spine palpation:  without tenderness of spinal processes   Lumbar spine palpation: without tenderness of lumbar area; without tightness of lumbar muscles    Range  of Motion:   Lumbar flexion, forward flexion is normal without pain or tenderness    Lumbar extension is full without pain or tenderness   Left lateral bend is normal without pain or tenderness   Right lateral bend is normal without pain or tenderness   Straight leg raising is normal  Strength & tone: normal   Stability overall normal stability All other systems reviewed and are negative   The patient has  been educated about the nature of the problem(s) and counseled on treatment options.  The patient appeared to understand what I have discussed and is in agreement with it.  Encounter Diagnosis  Name Primary?  . Chronic midline low back pain without sciatica Yes    PLAN Call if any problems.  Precautions discussed.  Continue current medications.   Return to clinic 3 months   I have reviewed the [facility] Controlled Substance Reporting System web site prior to prescribing narcotic medicine for this patient.  Electronically Signed Darreld Mclean, MD 5/14/201910:09 AM

## 2018-02-12 ENCOUNTER — Other Ambulatory Visit: Payer: Self-pay | Admitting: Orthopaedic Surgery

## 2018-02-12 MED ORDER — HYDROCODONE-ACETAMINOPHEN 7.5-325 MG PO TABS: 1.0000 | ORAL_TABLET | Freq: Four times a day (QID) | ORAL | 0 refills | Status: DC | PRN

## 2018-03-11 ENCOUNTER — Other Ambulatory Visit: Payer: Self-pay | Admitting: Orthopaedic Surgery

## 2018-03-12 MED ORDER — HYDROCODONE-ACETAMINOPHEN 7.5-325 MG PO TABS: 1.0000 | ORAL_TABLET | Freq: Four times a day (QID) | ORAL | 0 refills | Status: DC | PRN

## 2018-04-12 ENCOUNTER — Other Ambulatory Visit: Payer: Self-pay

## 2018-04-13 MED ORDER — HYDROCODONE-ACETAMINOPHEN 7.5-325 MG PO TABS: 1.0000 | ORAL_TABLET | Freq: Four times a day (QID) | ORAL | 0 refills | Status: DC | PRN

## 2018-04-15 ENCOUNTER — Ambulatory Visit: Payer: BLUE CROSS/BLUE SHIELD | Admitting: Orthopaedic Surgery

## 2018-04-15 ENCOUNTER — Encounter: Payer: Self-pay | Admitting: Orthopaedic Surgery

## 2018-04-15 VITALS — BP 119/71 | HR 63 | Temp 98.1°F | Ht 67.0 in | Wt 200.0 lb

## 2018-04-15 DIAGNOSIS — M545 Low back pain: Secondary | ICD-10-CM

## 2018-04-15 DIAGNOSIS — G8929 Other chronic pain: Secondary | ICD-10-CM

## 2018-04-15 MED ORDER — DICLOFENAC SODIUM 75 MG PO TBEC: 75.0000 mg | DELAYED_RELEASE_TABLET | Freq: Two times a day (BID) | ORAL | 5 refills | Status: DC

## 2018-04-15 NOTE — Progress Notes (Signed)
Patient PY:PPJKDTO D Urista, female DOB:03-Jul-1971, 47 y.o. [name]  Chief Complaint  Patient presents with  . Back Pain    Recheck on low back pain.    HPI  [name] is a 47 y.o. female who has chronic lower back pain.   She has run out of the diclofenac and I will refill.  She is taking her pain medicine and doing her exercises.  Her pain varies with activity and weather.  We have had a lot of rain recently and her pain is worse she states.  She has no paresthesias.   Body mass index is 31.32 kg/m.  ROS  Review of Systems  Constitutional:       Patient does not have Diabetes Mellitus. Patient does not have hypertension. Patient does not have COPD or shortness of breath. Patient does not have BMI > 35. Patient has current smoking history.  HENT: Negative for congestion.   Respiratory: Negative for cough and shortness of breath.   Cardiovascular: Negative for chest pain and leg swelling.  Endocrine: Positive for cold intolerance.  Musculoskeletal: Positive for arthralgias and back pain.  Allergic/Immunologic: Negative for environmental allergies.  All other systems reviewed and are negative.   All other systems reviewed and are negative.  Past Medical History:  Diagnosis Date  . Arthritis   . Chronic back pain     Past Surgical History:  Procedure Laterality Date  . CHOLECYSTECTOMY    . REPLACEMENT TOTAL KNEE BILATERAL    . TUBAL LIGATION      History reviewed. No pertinent family history.  Social History Social History   Tobacco Use  . Smoking status: Former Smoker    Packs/day: 0.50    Years: 20.00    Pack years: 10.00    Types: Cigarettes  . Smokeless tobacco: Never Used  Substance Use Topics  . Alcohol use: No  . Drug use: No    Allergies  Allergen Reactions  . Penicillins Nausea And Vomiting    Current Outpatient Medications  Medication Sig Dispense Refill  . diclofenac (VOLTAREN) 75 MG EC tablet Take 1 tablet (75 mg total) by  mouth 2 (two) times daily. 60 tablet 5  . HYDROcodone-acetaminophen (NORCO) 7.5-325 MG tablet Take 1 tablet by mouth every 6 (six) hours as needed for moderate pain (Must last 30 days). 45 tablet 0   No current facility-administered medications for this visit.      Physical Exam  Blood pressure 119/71, pulse 63, temperature 98.1 F (36.7 C), height 5\' 7"  (1.702 m), weight 200 lb (90.7 kg).  Constitutional: overall normal hygiene, normal nutrition, well developed, normal grooming, normal body habitus. Assistive device:none  Musculoskeletal: gait and station Limp none, muscle tone and strength are normal, no tremors or atrophy is present.  .  Neurological: coordination overall normal.  Deep tendon reflex/nerve stretch intact.  Sensation normal.  Cranial nerves II-XII intact.   Skin:   Normal overall no scars, lesions, ulcers or rashes. No psoriasis.  Psychiatric: Alert and oriented x 3.  Recent memory intact, remote memory unclear.  Normal mood and affect. Well groomed.  Good eye contact.  Cardiovascular: overall no swelling, no varicosities, no edema bilaterally, normal temperatures of the legs and arms, no clubbing, cyanosis and good capillary refill.  Lymphatic: palpation is normal.  Spine/Pelvis examination:  Inspection:  Overall, sacoiliac joint benign and hips nontender; without crepitus or defects.   Thoracic spine inspection: Alignment normal without kyphosis present   Lumbar spine inspection:  Alignment  with normal lumbar lordosis, without scoliosis apparent.   Thoracic spine palpation:  without tenderness of spinal processes   Lumbar spine palpation: without tenderness of lumbar area; without tightness of lumbar muscles    Range of Motion:   Lumbar flexion, forward flexion is normal without pain or tenderness    Lumbar extension is full without pain or tenderness   Left lateral bend is normal without pain or tenderness   Right lateral bend is normal without pain or  tenderness   Straight leg raising is normal  Strength & tone: normal   Stability overall normal stability All other systems reviewed and are negative   The patient has been educated about the nature of the problem(s) and counseled on treatment options.  The patient appeared to understand what I have discussed and is in agreement with it.  Encounter Diagnosis  Name Primary?  . Chronic midline low back pain without sciatica Yes    PLAN Call if any problems.  Precautions discussed.  Continue current medications.   Return to clinic 3 months   The patient has read and signed an Opioid Treatment Agreement which has been scanned and added to the medical record.  The patient understands the agreement and agrees to abide with it.  The patient has chronic pain that is being treated with an opioid which relieves the pain.  The patient understands potential complications with chronic opioid treatment.  Electronically Signed Darreld Mclean, MD 8/14/20199:01 AM

## 2018-05-11 ENCOUNTER — Other Ambulatory Visit: Payer: Self-pay

## 2018-05-11 MED ORDER — HYDROCODONE-ACETAMINOPHEN 7.5-325 MG PO TABS: 1.0000 | ORAL_TABLET | Freq: Four times a day (QID) | ORAL | 0 refills | Status: DC | PRN

## 2018-06-09 ENCOUNTER — Other Ambulatory Visit: Payer: Self-pay

## 2018-06-09 MED ORDER — HYDROCODONE-ACETAMINOPHEN 7.5-325 MG PO TABS: 1.0000 | ORAL_TABLET | Freq: Four times a day (QID) | ORAL | 0 refills | Status: DC | PRN

## 2018-07-07 ENCOUNTER — Other Ambulatory Visit: Payer: Self-pay

## 2018-07-07 MED ORDER — HYDROCODONE-ACETAMINOPHEN 7.5-325 MG PO TABS: 1.0000 | ORAL_TABLET | Freq: Four times a day (QID) | ORAL | 0 refills | Status: DC | PRN

## 2018-07-15 ENCOUNTER — Ambulatory Visit: Payer: BLUE CROSS/BLUE SHIELD | Admitting: Orthopaedic Surgery

## 2018-08-04 ENCOUNTER — Ambulatory Visit: Payer: BLUE CROSS/BLUE SHIELD | Admitting: Orthopaedic Surgery

## 2018-08-04 ENCOUNTER — Encounter: Payer: Self-pay | Admitting: Orthopaedic Surgery

## 2018-08-04 VITALS — BP 104/75 | HR 70 | Ht 67.0 in | Wt 205.0 lb

## 2018-08-04 DIAGNOSIS — M545 Low back pain, unspecified: Secondary | ICD-10-CM

## 2018-08-04 DIAGNOSIS — G8929 Other chronic pain: Secondary | ICD-10-CM

## 2018-08-04 NOTE — Progress Notes (Signed)
Patient [name], female DOB:07/22/1971, 47 y.o. UVO:[id number]  Chief Complaint  Patient presents with  . Back Pain    HPI  [name] is a 47 y.o. female who has continued lower back pain.  She has more pain at the end of her work shift and cold days.  She has no paresthesias.  She is taking her medicine and doing her exercises.     Body mass index is 32.11 kg/m.  ROS  Review of Systems  Constitutional:       Patient does not have Diabetes Mellitus. Patient does not have hypertension. Patient does not have COPD or shortness of breath. Patient does not have BMI > 35. Patient has current smoking history.  HENT: Negative for congestion.   Respiratory: Negative for cough and shortness of breath.   Cardiovascular: Negative for chest pain and leg swelling.  Endocrine: Positive for cold intolerance.  Musculoskeletal: Positive for arthralgias and back pain.  Allergic/Immunologic: Negative for environmental allergies.  All other systems reviewed and are negative.   All other systems reviewed and are negative.  The following is a summary of the past history medically, past history surgically, known current medicines, social history and family history.  This information is gathered electronically by the computer from prior information and documentation.  I review this each visit and have found including this information at this point in the chart is beneficial and informative.    Past Medical History:  Diagnosis Date  . Arthritis   . Chronic back pain     Past Surgical History:  Procedure Laterality Date  . CHOLECYSTECTOMY    . REPLACEMENT TOTAL KNEE BILATERAL    . TUBAL LIGATION      Family History  Family history unknown: Yes    Social History Social History   Tobacco Use  . Smoking status: Former Smoker    Packs/day: 0.50    Years: 20.00    Pack years: 10.00    Types: Cigarettes  . Smokeless tobacco: Never Used  Substance Use Topics  .  Alcohol use: No  . Drug use: No    Allergies  Allergen Reactions  . Penicillins Nausea And Vomiting    Current Outpatient Medications  Medication Sig Dispense Refill  . diclofenac (VOLTAREN) 75 MG EC tablet Take 1 tablet (75 mg total) by mouth 2 (two) times daily. 60 tablet 5  . HYDROcodone-acetaminophen (NORCO) 7.5-325 MG tablet Take 1 tablet by mouth every 6 (six) hours as needed for moderate pain (Must last 30 days). 45 tablet 0   No current facility-administered medications for this visit.      Physical Exam  Blood pressure 104/75, pulse 70, height 5\' 7"  (1.702 m), weight 205 lb (93 kg).  Constitutional: overall normal hygiene, normal nutrition, well developed, normal grooming, normal body habitus. Assistive device:none  Musculoskeletal: gait and station Limp none, muscle tone and strength are normal, no tremors or atrophy is present.  .  Neurological: coordination overall normal.  Deep tendon reflex/nerve stretch intact.  Sensation normal.  Cranial nerves II-XII intact.   Skin:   Normal overall no scars, lesions, ulcers or rashes. No psoriasis.  Psychiatric: Alert and oriented x 3.  Recent memory intact, remote memory unclear.  Normal mood and affect. Well groomed.  Good eye contact.  Cardiovascular: overall no swelling, no varicosities, no edema bilaterally, normal temperatures of the legs and arms, no clubbing, cyanosis and good capillary refill.  Lymphatic: palpation is normal.  Spine/Pelvis examination:  Inspection:  Overall, sacoiliac joint benign and hips nontender; without crepitus or defects.   Thoracic spine inspection: Alignment normal without kyphosis present   Lumbar spine inspection:  Alignment  with normal lumbar lordosis, without scoliosis apparent.   Thoracic spine palpation:  without tenderness of spinal processes   Lumbar spine palpation: without tenderness of lumbar area; without tightness of lumbar muscles    Range of Motion:   Lumbar flexion,  forward flexion is normal without pain or tenderness    Lumbar extension is full without pain or tenderness   Left lateral bend is normal without pain or tenderness   Right lateral bend is normal without pain or tenderness   Straight leg raising is normal  Strength & tone: normal   Stability overall normal stability  All other systems reviewed and are negative   The patient has been educated about the nature of the problem(s) and counseled on treatment options.  The patient appeared to understand what I have discussed and is in agreement with it.  Encounter Diagnosis  Name Primary?  . Chronic midline low back pain without sciatica Yes    PLAN Call if any problems.  Precautions discussed.  Continue current medications.   Return to clinic 3 months   The patient has read and signed an Opioid Treatment Agreement which has been scanned and added to the medical record.  The patient understands the agreement and agrees to abide with it.  The patient has chronic pain that is being treated with an opioid which relieves the pain.  The patient understands potential complications with chronic opioid treatment.   Electronically Signed Darreld Mclean, MD 12/3/20192:07 PM

## 2018-08-05 ENCOUNTER — Other Ambulatory Visit: Payer: Self-pay

## 2018-08-05 MED ORDER — HYDROCODONE-ACETAMINOPHEN 7.5-325 MG PO TABS: 1.0000 | ORAL_TABLET | Freq: Four times a day (QID) | ORAL | 0 refills | Status: DC | PRN

## 2018-08-06 ENCOUNTER — Ambulatory Visit: Payer: BLUE CROSS/BLUE SHIELD | Admitting: Orthopaedic Surgery

## 2018-09-05 ENCOUNTER — Other Ambulatory Visit: Payer: Self-pay

## 2018-09-07 MED ORDER — HYDROCODONE-ACETAMINOPHEN 7.5-325 MG PO TABS: 1.0000 | ORAL_TABLET | Freq: Four times a day (QID) | ORAL | 0 refills | Status: DC | PRN

## 2018-10-06 ENCOUNTER — Other Ambulatory Visit: Payer: Self-pay

## 2018-10-06 MED ORDER — HYDROCODONE-ACETAMINOPHEN 7.5-325 MG PO TABS: 1.0000 | ORAL_TABLET | Freq: Four times a day (QID) | ORAL | 0 refills | Status: DC | PRN

## 2018-10-07 ENCOUNTER — Other Ambulatory Visit: Payer: Self-pay | Admitting: Orthopaedic Surgery

## 2018-11-03 ENCOUNTER — Encounter: Payer: Self-pay | Admitting: Orthopaedic Surgery

## 2018-11-03 ENCOUNTER — Ambulatory Visit: Payer: BLUE CROSS/BLUE SHIELD | Admitting: Orthopaedic Surgery

## 2018-11-03 VITALS — BP 104/73 | HR 60 | Ht 66.0 in | Wt 215.0 lb

## 2018-11-03 DIAGNOSIS — M545 Low back pain: Secondary | ICD-10-CM

## 2018-11-03 DIAGNOSIS — G8929 Other chronic pain: Secondary | ICD-10-CM | POA: Diagnosis not present

## 2018-11-03 MED ORDER — HYDROCODONE-ACETAMINOPHEN 7.5-325 MG PO TABS: 1.0000 | ORAL_TABLET | Freq: Four times a day (QID) | ORAL | 0 refills | Status: DC | PRN

## 2018-11-03 NOTE — Progress Notes (Signed)
Patient [name], female DOB:1971/07/14, 48 y.o. OEV:[id number]  Chief Complaint  Patient presents with  . Back Pain    HPI  [name] is a 48 y.o. female who has chronic lower back pain.  She has had more pain with the cold rainy weather.  She has no new trauma. She has no spasm or weakness.  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   Body mass index is 34.7 kg/m.  ROS  Review of Systems  Constitutional:       Patient does not have Diabetes Mellitus. Patient does not have hypertension. Patient does not have COPD or shortness of breath. Patient does not have BMI > 35. Patient has current smoking history.  HENT: Negative for congestion.   Respiratory: Negative for cough and shortness of breath.   Cardiovascular: Negative for chest pain and leg swelling.  Endocrine: Positive for cold intolerance.  Musculoskeletal: Positive for arthralgias and back pain.  Allergic/Immunologic: Negative for environmental allergies.  All other systems reviewed and are negative.   All other systems reviewed and are negative.  The following is a summary of the past history medically, past history surgically, known current medicines, social history and family history.  This information is gathered electronically by the computer from prior information and documentation.  I review this each visit and have found including this information at this point in the chart is beneficial and informative.    Past Medical History:  Diagnosis Date  . Arthritis   . Chronic back pain     Past Surgical History:  Procedure Laterality Date  . CHOLECYSTECTOMY    . REPLACEMENT TOTAL KNEE BILATERAL    . TUBAL LIGATION      Family History  Family history unknown: Yes    Social History Social History   Tobacco Use  . Smoking status: Former Smoker    Packs/day: 0.50    Years: 20.00    Pack years: 10.00    Types: Cigarettes  . Smokeless tobacco: Never Used   Substance Use Topics  . Alcohol use: No  . Drug use: No    Allergies  Allergen Reactions  . Penicillins Nausea And Vomiting    Current Outpatient Medications  Medication Sig Dispense Refill  . diclofenac (VOLTAREN) 75 MG EC tablet Take 1 tablet (75 mg total) by mouth 2 (two) times daily. 60 tablet 5  . HYDROcodone-acetaminophen (NORCO) 7.5-325 MG tablet Take 1 tablet by mouth every 6 (six) hours as needed for moderate pain (Must last 30 days). 42 tablet 0   No current facility-administered medications for this visit.      Physical Exam  Blood pressure 104/73, pulse 60, height 5\' 6"  (1.676 m), weight 215 lb (97.5 kg).  Constitutional: overall normal hygiene, normal nutrition, well developed, normal grooming, normal body habitus. Assistive device:none  Musculoskeletal: gait and station Limp none, muscle tone and strength are normal, no tremors or atrophy is present.  .  Neurological: coordination overall normal.  Deep tendon reflex/nerve stretch intact.  Sensation normal.  Cranial nerves II-XII intact.   Skin:   Normal overall no scars, lesions, ulcers or rashes. No psoriasis.  Psychiatric: Alert and oriented x 3.  Recent memory intact, remote memory unclear.  Normal mood and affect. Well groomed.  Good eye contact.  Cardiovascular: overall no swelling, no varicosities, no edema bilaterally, normal temperatures of the legs and arms, no clubbing, cyanosis and good capillary refill.  Lymphatic: palpation is normal.  Spine/Pelvis examination:  Inspection:  Overall, sacoiliac joint benign and hips nontender; without crepitus or defects.   Thoracic spine inspection: Alignment normal without kyphosis present   Lumbar spine inspection:  Alignment  with normal lumbar lordosis, without scoliosis apparent.   Thoracic spine palpation:  without tenderness of spinal processes   Lumbar spine palpation: without tenderness of lumbar area; without tightness of lumbar muscles    Range  of Motion:   Lumbar flexion, forward flexion is normal without pain or tenderness    Lumbar extension is full without pain or tenderness   Left lateral bend is normal without pain or tenderness   Right lateral bend is normal without pain or tenderness   Straight leg raising is normal  Strength & tone: normal   Stability overall normal stability  All other systems reviewed and are negative   The patient has been educated about the nature of the problem(s) and counseled on treatment options.  The patient appeared to understand what I have discussed and is in agreement with it.  Encounter Diagnosis  Name Primary?  . Chronic midline low back pain without sciatica Yes    PLAN Call if any problems.  Precautions discussed.  Continue current medications.   Return to clinic 3 months   The patient has read and signed an Opioid Treatment Agreement which has been scanned and added to the medical record.  The patient understands the agreement and agrees to abide with it.  The patient has chronic pain that is being treated with an opioid which relieves the pain.  The patient understands potential complications with chronic opioid treatment.   I have reviewed the [place] Controlled Substance Reporting System web site prior to prescribing narcotic medicine for this patient.   Electronically Signed Darreld Mclean, MD 3/3/20201:56 PM

## 2018-12-02 ENCOUNTER — Other Ambulatory Visit: Payer: Self-pay

## 2018-12-03 MED ORDER — HYDROCODONE-ACETAMINOPHEN 7.5-325 MG PO TABS: 1.0000 | ORAL_TABLET | Freq: Four times a day (QID) | ORAL | 0 refills | Status: DC | PRN

## 2018-12-31 ENCOUNTER — Other Ambulatory Visit: Payer: Self-pay

## 2019-01-02 ENCOUNTER — Other Ambulatory Visit: Payer: Self-pay

## 2019-01-05 MED ORDER — HYDROCODONE-ACETAMINOPHEN 7.5-325 MG PO TABS: 1.0000 | ORAL_TABLET | Freq: Four times a day (QID) | ORAL | 0 refills | Status: DC | PRN

## 2019-02-01 ENCOUNTER — Other Ambulatory Visit: Payer: Self-pay

## 2019-02-02 ENCOUNTER — Ambulatory Visit: Payer: BLUE CROSS/BLUE SHIELD | Admitting: Orthopaedic Surgery

## 2019-02-02 MED ORDER — HYDROCODONE-ACETAMINOPHEN 7.5-325 MG PO TABS: 1.0000 | ORAL_TABLET | Freq: Four times a day (QID) | ORAL | 0 refills | Status: DC | PRN

## 2019-02-09 ENCOUNTER — Ambulatory Visit: Payer: BLUE CROSS/BLUE SHIELD | Admitting: Orthopaedic Surgery

## 2019-02-10 ENCOUNTER — Ambulatory Visit (INDEPENDENT_AMBULATORY_CARE_PROVIDER_SITE_OTHER): Payer: BLUE CROSS/BLUE SHIELD | Admitting: Orthopaedic Surgery

## 2019-02-10 ENCOUNTER — Encounter: Payer: Self-pay | Admitting: Orthopaedic Surgery

## 2019-02-10 ENCOUNTER — Other Ambulatory Visit: Payer: Self-pay

## 2019-02-10 DIAGNOSIS — M545 Low back pain, unspecified: Secondary | ICD-10-CM

## 2019-02-10 DIAGNOSIS — G8929 Other chronic pain: Secondary | ICD-10-CM | POA: Diagnosis not present

## 2019-02-10 NOTE — Progress Notes (Signed)
Virtual Visit via Telephone Note  I connected with@ on 02/10/19 at 10:20 AM EDT by telephone and verified that I am speaking with the correct person using two identifiers.  Location: Patient: home Provider: office   I discussed the limitations, risks, security and privacy concerns of performing an evaluation and management service by telephone and the availability of in person appointments. I also discussed with the patient that there may be a patient responsible charge related to this service. The patient expressed understanding and agreed to proceed.   History of Present Illness: She has continued lower back pain with no numbness or weakness.  She is taking her medicine and doing her exercises. She walks a lot at work.  She has no new trauma.   Observations/Objective: Per above.  Assessment and Plan: Encounter Diagnosis  Name Primary?  . Chronic midline low back pain without sciatica Yes     Follow Up Instructions: Continue present medicine.  I will see in six weeks.   I discussed the assessment and treatment plan with the patient. The patient was provided an opportunity to ask questions and all were answered. The patient agreed with the plan and demonstrated an understanding of the instructions.   The patient was advised to call back or seek an in-person evaluation if the symptoms worsen or if the condition fails to improve as anticipated.  I provided 8 minutes of non-face-to-face time during this encounter.   Sanjuana Kava, MD

## 2019-03-02 ENCOUNTER — Other Ambulatory Visit: Payer: Self-pay

## 2019-03-02 MED ORDER — HYDROCODONE-ACETAMINOPHEN 7.5-325 MG PO TABS: 1.0000 | ORAL_TABLET | Freq: Four times a day (QID) | ORAL | 0 refills | Status: DC | PRN

## 2019-03-23 ENCOUNTER — Other Ambulatory Visit: Payer: Self-pay

## 2019-03-23 ENCOUNTER — Ambulatory Visit: Payer: BC Managed Care – PPO | Admitting: Orthopaedic Surgery

## 2019-03-23 ENCOUNTER — Encounter: Payer: Self-pay | Admitting: Orthopaedic Surgery

## 2019-03-23 VITALS — BP 112/79 | HR 64 | Temp 97.7°F | Ht 66.5 in | Wt 208.5 lb

## 2019-03-23 DIAGNOSIS — G8929 Other chronic pain: Secondary | ICD-10-CM

## 2019-03-23 DIAGNOSIS — M545 Low back pain: Secondary | ICD-10-CM

## 2019-03-23 NOTE — Progress Notes (Signed)
Patient [name], female DOB:Jan 15, 1971, 48 y.o. ELF:[id number]  Chief Complaint  Patient presents with  . Back Pain    Midline/moderate pain    HPI  [name] is a 48 y.o. female who has chronic lower back pain. She has good and bad days more affected by activity.  She has no weakness, no trauma.     Body mass index is 33.15 kg/m.  ROS  Review of Systems  Constitutional:       Patient does not have Diabetes Mellitus. Patient does not have hypertension. Patient does not have COPD or shortness of breath. Patient does not have BMI > 35. Patient has current smoking history.  HENT: Negative for congestion.   Respiratory: Negative for cough and shortness of breath.   Cardiovascular: Negative for chest pain and leg swelling.  Endocrine: Positive for cold intolerance.  Musculoskeletal: Positive for arthralgias and back pain.  Allergic/Immunologic: Negative for environmental allergies.  All other systems reviewed and are negative.   All other systems reviewed and are negative.  The following is a summary of the past history medically, past history surgically, known current medicines, social history and family history.  This information is gathered electronically by the computer from prior information and documentation.  I review this each visit and have found including this information at this point in the chart is beneficial and informative.    Past Medical History:  Diagnosis Date  . Arthritis   . Chronic back pain     Past Surgical History:  Procedure Laterality Date  . CHOLECYSTECTOMY    . REPLACEMENT TOTAL KNEE BILATERAL    . TUBAL LIGATION      Family History  Family history unknown: Yes    Social History Social History   Tobacco Use  . Smoking status: Former Smoker    Packs/day: 0.50    Years: 20.00    Pack years: 10.00    Types: Cigarettes  . Smokeless tobacco: Never Used  Substance Use Topics  . Alcohol use: No  . Drug use: No     Allergies  Allergen Reactions  . Penicillins Nausea And Vomiting    Current Outpatient Medications  Medication Sig Dispense Refill  . diclofenac (VOLTAREN) 75 MG EC tablet Take 1 tablet (75 mg total) by mouth 2 (two) times daily. 60 tablet 5  . HYDROcodone-acetaminophen (NORCO) 7.5-325 MG tablet Take 1 tablet by mouth every 6 (six) hours as needed for moderate pain (Must last 30 days). 42 tablet 0   No current facility-administered medications for this visit.      Physical Exam  Blood pressure 112/79, pulse 64, temperature 97.7 F (36.5 C), height 5' 6.5" (1.689 m), weight 208 lb 8 oz (94.6 kg).  Constitutional: overall normal hygiene, normal nutrition, well developed, normal grooming, normal body habitus. Assistive device:none  Musculoskeletal: gait and station Limp none, muscle tone and strength are normal, no tremors or atrophy is present.  .  Neurological: coordination overall normal.  Deep tendon reflex/nerve stretch intact.  Sensation normal.  Cranial nerves II-XII intact.   Skin:   Normal overall no scars, lesions, ulcers or rashes. No psoriasis.  Psychiatric: Alert and oriented x 3.  Recent memory intact, remote memory unclear.  Normal mood and affect. Well groomed.  Good eye contact.  Cardiovascular: overall no swelling, no varicosities, no edema bilaterally, normal temperatures of the legs and arms, no clubbing, cyanosis and good capillary refill.  Lymphatic: palpation is normal.  Spine/Pelvis examination:  Inspection:  Overall,  sacoiliac joint benign and hips nontender; without crepitus or defects.   Thoracic spine inspection: Alignment normal without kyphosis present   Lumbar spine inspection:  Alignment  with normal lumbar lordosis, without scoliosis apparent.   Thoracic spine palpation:  without tenderness of spinal processes   Lumbar spine palpation: without tenderness of lumbar area; without tightness of lumbar muscles    Range of Motion:   Lumbar  flexion, forward flexion is normal without pain or tenderness    Lumbar extension is full without pain or tenderness   Left lateral bend is normal without pain or tenderness   Right lateral bend is normal without pain or tenderness   Straight leg raising is normal  Strength & tone: normal   Stability overall normal stability  All other systems reviewed and are negative   The patient has been educated about the nature of the problem(s) and counseled on treatment options.  The patient appeared to understand what I have discussed and is in agreement with it.  Encounter Diagnosis  Name Primary?  . Chronic midline low back pain without sciatica Yes    PLAN Call if any problems.  Precautions discussed.  Continue current medications.   Return to clinic 3 months   Electronically Signed Darreld Mclean, MD 7/21/20209:33 AM

## 2019-03-30 ENCOUNTER — Other Ambulatory Visit: Payer: Self-pay

## 2019-03-30 MED ORDER — HYDROCODONE-ACETAMINOPHEN 7.5-325 MG PO TABS: 1.0000 | ORAL_TABLET | Freq: Four times a day (QID) | ORAL | 0 refills | Status: DC | PRN

## 2019-03-31 ENCOUNTER — Other Ambulatory Visit: Payer: Self-pay | Admitting: Orthopaedic Surgery

## 2019-04-29 ENCOUNTER — Other Ambulatory Visit: Payer: Self-pay

## 2019-04-29 MED ORDER — HYDROCODONE-ACETAMINOPHEN 7.5-325 MG PO TABS: 1.0000 | ORAL_TABLET | Freq: Four times a day (QID) | ORAL | 0 refills | Status: DC | PRN

## 2019-05-25 ENCOUNTER — Telehealth: Payer: Self-pay

## 2019-05-25 MED ORDER — HYDROCODONE-ACETAMINOPHEN 7.5-325 MG PO TABS: 1.0000 | ORAL_TABLET | Freq: Four times a day (QID) | ORAL | 0 refills | Status: DC | PRN

## 2019-05-25 NOTE — Telephone Encounter (Signed)
Hydrocodone-Acetaminophen 7.5/325mg  Qty 42 Tablets  PATIENT USES [facility]

## 2019-06-22 ENCOUNTER — Ambulatory Visit: Payer: BC Managed Care – PPO | Admitting: Orthopaedic Surgery

## 2019-06-23 ENCOUNTER — Other Ambulatory Visit: Payer: Self-pay

## 2019-06-23 MED ORDER — HYDROCODONE-ACETAMINOPHEN 7.5-325 MG PO TABS: 1.0000 | ORAL_TABLET | Freq: Four times a day (QID) | ORAL | 0 refills | Status: DC | PRN

## 2019-06-29 ENCOUNTER — Ambulatory Visit: Payer: BC Managed Care – PPO | Admitting: Orthopaedic Surgery

## 2019-06-29 ENCOUNTER — Encounter: Payer: Self-pay | Admitting: Orthopaedic Surgery

## 2019-06-29 ENCOUNTER — Other Ambulatory Visit: Payer: Self-pay

## 2019-06-29 VITALS — BP 108/64 | HR 72 | Temp 97.0°F | Ht 66.0 in | Wt 198.0 lb

## 2019-06-29 DIAGNOSIS — G8929 Other chronic pain: Secondary | ICD-10-CM | POA: Diagnosis not present

## 2019-06-29 DIAGNOSIS — M545 Low back pain: Secondary | ICD-10-CM | POA: Diagnosis not present

## 2019-06-29 NOTE — Progress Notes (Signed)
Patient [name], female DOB:1971-03-13, 48 y.o. [id number]  Chief Complaint  Patient presents with  . Back Pain    Back pain.    HPI  [name] is a 48 y.o. female who has lower back pain.  She has some good and some bad days.  She has no weakness, no trauma.  She is taking her medicine and doing her exercises.   Body mass index is 31.96 kg/m.  ROS  Review of Systems  Constitutional:       Patient does not have Diabetes Mellitus. Patient does not have hypertension. Patient does not have COPD or shortness of breath. Patient does not have BMI > 35. Patient has current smoking history.  HENT: Negative for congestion.   Respiratory: Negative for cough and shortness of breath.   Cardiovascular: Negative for chest pain and leg swelling.  Endocrine: Positive for cold intolerance.  Musculoskeletal: Positive for arthralgias and back pain.  Allergic/Immunologic: Negative for environmental allergies.  All other systems reviewed and are negative.   All other systems reviewed and are negative.  The following is a summary of the past history medically, past history surgically, known current medicines, social history and family history.  This information is gathered electronically by the computer from prior information and documentation.  I review this each visit and have found including this information at this point in the chart is beneficial and informative.    Past Medical History:  Diagnosis Date  . Arthritis   . Chronic back pain     Past Surgical History:  Procedure Laterality Date  . CHOLECYSTECTOMY    . REPLACEMENT TOTAL KNEE BILATERAL    . TUBAL LIGATION      Family History  Family history unknown: Yes    Social History Social History   Tobacco Use  . Smoking status: Former Smoker    Packs/day: 0.50    Years: 20.00    Pack years: 10.00    Types: Cigarettes  . Smokeless tobacco: Never Used  Substance Use Topics  . Alcohol use: No  .  Drug use: No    Allergies  Allergen Reactions  . Penicillins Nausea And Vomiting    Current Outpatient Medications  Medication Sig Dispense Refill  . diclofenac (VOLTAREN) 75 MG EC tablet TAKE  (1)  TABLET TWICE A DAY. 60 tablet 5  . HYDROcodone-acetaminophen (NORCO) 7.5-325 MG tablet Take 1 tablet by mouth every 6 (six) hours as needed for moderate pain (Must last 30 days). 42 tablet 0   No current facility-administered medications for this visit.      Physical Exam  Blood pressure 108/64, pulse 72, temperature (!) 97 F (36.1 C), height 5\' 6"  (1.676 m), weight 198 lb (89.8 kg).  Constitutional: overall normal hygiene, normal nutrition, well developed, normal grooming, normal body habitus. Assistive device:none  Musculoskeletal: gait and station Limp none, muscle tone and strength are normal, no tremors or atrophy is present.  .  Neurological: coordination overall normal.  Deep tendon reflex/nerve stretch intact.  Sensation normal.  Cranial nerves II-XII intact.   Skin:   Normal overall no scars, lesions, ulcers or rashes. No psoriasis.  Psychiatric: Alert and oriented x 3.  Recent memory intact, remote memory unclear.  Normal mood and affect. Well groomed.  Good eye contact.  Cardiovascular: overall no swelling, no varicosities, no edema bilaterally, normal temperatures of the legs and arms, no clubbing, cyanosis and good capillary refill.  Lymphatic: palpation is normal.  Spine/Pelvis examination:  Inspection:  Overall, sacoiliac joint benign and hips nontender; without crepitus or defects.   Thoracic spine inspection: Alignment normal without kyphosis present   Lumbar spine inspection:  Alignment  with normal lumbar lordosis, without scoliosis apparent.   Thoracic spine palpation:  without tenderness of spinal processes   Lumbar spine palpation: without tenderness of lumbar area; without tightness of lumbar muscles    Range of Motion:   Lumbar flexion, forward  flexion is normal without pain or tenderness    Lumbar extension is full without pain or tenderness   Left lateral bend is normal without pain or tenderness   Right lateral bend is normal without pain or tenderness   Straight leg raising is normal  Strength & tone: normal   Stability overall normal stability  All other systems reviewed and are negative   The patient has been educated about the nature of the problem(s) and counseled on treatment options.  The patient appeared to understand what I have discussed and is in agreement with it.  Encounter Diagnosis  Name Primary?  . Chronic midline low back pain without sciatica Yes    PLAN Call if any problems.  Precautions discussed.  Continue current medications.   Return to clinic 3 months   Electronically Signed Sanjuana Kava, MD 10/27/20209:30 AM

## 2019-07-22 ENCOUNTER — Other Ambulatory Visit: Payer: Self-pay

## 2019-07-23 ENCOUNTER — Other Ambulatory Visit: Payer: Self-pay

## 2019-07-26 MED ORDER — HYDROCODONE-ACETAMINOPHEN 7.5-325 MG PO TABS: 1.0000 | ORAL_TABLET | Freq: Four times a day (QID) | ORAL | 0 refills | Status: DC | PRN

## 2019-08-22 ENCOUNTER — Other Ambulatory Visit: Payer: Self-pay

## 2019-08-23 MED ORDER — HYDROCODONE-ACETAMINOPHEN 7.5-325 MG PO TABS: 1.0000 | ORAL_TABLET | Freq: Four times a day (QID) | ORAL | 0 refills | Status: DC | PRN

## 2019-09-22 ENCOUNTER — Other Ambulatory Visit: Payer: Self-pay

## 2019-09-22 MED ORDER — HYDROCODONE-ACETAMINOPHEN 7.5-325 MG PO TABS: 1.0000 | ORAL_TABLET | Freq: Four times a day (QID) | ORAL | 0 refills | Status: DC | PRN

## 2019-09-23 ENCOUNTER — Other Ambulatory Visit: Payer: Self-pay | Admitting: Orthopaedic Surgery

## 2019-09-30 ENCOUNTER — Ambulatory Visit: Payer: BC Managed Care – PPO | Admitting: Orthopaedic Surgery

## 2019-10-12 ENCOUNTER — Other Ambulatory Visit: Payer: Self-pay

## 2019-10-12 ENCOUNTER — Encounter: Payer: Self-pay | Admitting: Orthopaedic Surgery

## 2019-10-12 ENCOUNTER — Ambulatory Visit: Payer: BC Managed Care – PPO | Admitting: Orthopaedic Surgery

## 2019-10-12 VITALS — BP 102/72 | HR 72 | Ht 66.0 in | Wt 198.0 lb

## 2019-10-12 DIAGNOSIS — M545 Low back pain: Secondary | ICD-10-CM

## 2019-10-12 DIAGNOSIS — G8929 Other chronic pain: Secondary | ICD-10-CM | POA: Diagnosis not present

## 2019-10-12 NOTE — Progress Notes (Signed)
Patient [name], female DOB:1971-08-19, 49 y.o. IWO:[id number]  Chief Complaint  Patient presents with  . Back Pain    HPI  [name] is a 49 y.o. female who has chronic lower back pain. She has more pain with the cold weather. She has no weakness, no new trauma.  She is doing her exercises and taking her medicine.   Body mass index is 31.96 kg/m.  ROS  Review of Systems  Constitutional:       Patient does not have Diabetes Mellitus. Patient does not have hypertension. Patient does not have COPD or shortness of breath. Patient does not have BMI > 35. Patient has current smoking history.  HENT: Negative for congestion.   Respiratory: Negative for cough and shortness of breath.   Cardiovascular: Negative for chest pain and leg swelling.  Endocrine: Positive for cold intolerance.  Musculoskeletal: Positive for arthralgias and back pain.  Allergic/Immunologic: Negative for environmental allergies.  All other systems reviewed and are negative.   All other systems reviewed and are negative.  The following is a summary of the past history medically, past history surgically, known current medicines, social history and family history.  This information is gathered electronically by the computer from prior information and documentation.  I review this each visit and have found including this information at this point in the chart is beneficial and informative.    Past Medical History:  Diagnosis Date  . Arthritis   . Chronic back pain     Past Surgical History:  Procedure Laterality Date  . CHOLECYSTECTOMY    . REPLACEMENT TOTAL KNEE BILATERAL    . TUBAL LIGATION      Family History  Family history unknown: Yes    Social History Social History   Tobacco Use  . Smoking status: Former Smoker    Packs/day: 0.50    Years: 20.00    Pack years: 10.00    Types: Cigarettes  . Smokeless tobacco: Never Used  Substance Use Topics  . Alcohol use: No  .  Drug use: No    Allergies  Allergen Reactions  . Penicillins Nausea And Vomiting    Current Outpatient Medications  Medication Sig Dispense Refill  . diclofenac (VOLTAREN) 75 MG EC tablet TAKE  (1)  TABLET TWICE A DAY. 60 tablet 5  . HYDROcodone-acetaminophen (NORCO) 7.5-325 MG tablet Take 1 tablet by mouth every 6 (six) hours as needed for moderate pain (Must last 30 days). 42 tablet 0   No current facility-administered medications for this visit.     Physical Exam  Blood pressure 102/72, pulse 72, height 5\' 6"  (1.676 m), weight 198 lb (89.8 kg).  Constitutional: overall normal hygiene, normal nutrition, well developed, normal grooming, normal body habitus. Assistive device:none  Musculoskeletal: gait and station Limp none, muscle tone and strength are normal, no tremors or atrophy is present.  .  Neurological: coordination overall normal.  Deep tendon reflex/nerve stretch intact.  Sensation normal.  Cranial nerves II-XII intact.   Skin:   Normal overall no scars, lesions, ulcers or rashes. No psoriasis.  Psychiatric: Alert and oriented x 3.  Recent memory intact, remote memory unclear.  Normal mood and affect. Well groomed.  Good eye contact.  Cardiovascular: overall no swelling, no varicosities, no edema bilaterally, normal temperatures of the legs and arms, no clubbing, cyanosis and good capillary refill.  Lymphatic: palpation is normal.  Spine/Pelvis examination:  Inspection:  Overall, sacoiliac joint benign and hips nontender; without crepitus or defects.  Thoracic spine inspection: Alignment normal without kyphosis present   Lumbar spine inspection:  Alignment  with normal lumbar lordosis, without scoliosis apparent.   Thoracic spine palpation:  without tenderness of spinal processes   Lumbar spine palpation: without tenderness of lumbar area; without tightness of lumbar muscles    Range of Motion:   Lumbar flexion, forward flexion is normal without pain or  tenderness    Lumbar extension is full without pain or tenderness   Left lateral bend is normal without pain or tenderness   Right lateral bend is normal without pain or tenderness   Straight leg raising is normal  Strength & tone: normal   Stability overall normal stability  All other systems reviewed and are negative   The patient has been educated about the nature of the problem(s) and counseled on treatment options.  The patient appeared to understand what I have discussed and is in agreement with it.  Encounter Diagnosis  Name Primary?  . Chronic midline low back pain without sciatica Yes    PLAN Call if any problems.  Precautions discussed.  Continue current medications.   Return to clinic 3 months   Electronically Signed Sanjuana Kava, MD 2/9/202110:15 AM

## 2019-10-23 ENCOUNTER — Other Ambulatory Visit: Payer: Self-pay | Admitting: Orthopaedic Surgery

## 2019-10-25 MED ORDER — HYDROCODONE-ACETAMINOPHEN 7.5-325 MG PO TABS: 1.0000 | ORAL_TABLET | Freq: Four times a day (QID) | ORAL | 0 refills | Status: DC | PRN

## 2019-11-21 ENCOUNTER — Other Ambulatory Visit: Payer: Self-pay | Admitting: Orthopaedic Surgery

## 2019-11-22 MED ORDER — HYDROCODONE-ACETAMINOPHEN 7.5-325 MG PO TABS: 1.0000 | ORAL_TABLET | Freq: Four times a day (QID) | ORAL | 0 refills | Status: DC | PRN

## 2019-12-21 ENCOUNTER — Other Ambulatory Visit: Payer: Self-pay | Admitting: Orthopaedic Surgery

## 2019-12-22 MED ORDER — HYDROCODONE-ACETAMINOPHEN 7.5-325 MG PO TABS: 1.0000 | ORAL_TABLET | Freq: Four times a day (QID) | ORAL | 0 refills | Status: DC | PRN

## 2020-01-11 ENCOUNTER — Ambulatory Visit: Payer: BC Managed Care – PPO | Admitting: Orthopaedic Surgery

## 2020-01-18 ENCOUNTER — Other Ambulatory Visit: Payer: Self-pay

## 2020-01-18 ENCOUNTER — Ambulatory Visit: Payer: BC Managed Care – PPO | Admitting: Orthopaedic Surgery

## 2020-01-18 ENCOUNTER — Other Ambulatory Visit: Payer: Self-pay | Admitting: Orthopaedic Surgery

## 2020-01-18 ENCOUNTER — Encounter: Payer: Self-pay | Admitting: Orthopaedic Surgery

## 2020-01-18 VITALS — BP 114/77 | HR 75 | Ht 66.0 in | Wt 196.0 lb

## 2020-01-18 DIAGNOSIS — G8929 Other chronic pain: Secondary | ICD-10-CM | POA: Diagnosis not present

## 2020-01-18 DIAGNOSIS — M545 Low back pain, unspecified: Secondary | ICD-10-CM

## 2020-01-18 NOTE — Progress Notes (Signed)
Patient [name]:[id number] D Sindelar, female DOB:09/19/1970, 49 y.o. JHE:[id number]  Chief Complaint  Patient presents with  . Back Pain    HPI  [name] is a 49 y.o. female who has chronic lower back pain. She has good and bad days, related to weather and activity. She has no numbness, no weakness, no trauma.  She is doing her exercises and taking her medicine.   Body mass index is 31.64 kg/m.  ROS  Review of Systems  Constitutional:       Patient does not have Diabetes Mellitus. Patient does not have hypertension. Patient does not have COPD or shortness of breath. Patient does not have BMI > 35. Patient has current smoking history.  HENT: Negative for congestion.   Respiratory: Negative for cough and shortness of breath.   Cardiovascular: Negative for chest pain and leg swelling.  Endocrine: Positive for cold intolerance.  Musculoskeletal: Positive for arthralgias and back pain.  Allergic/Immunologic: Negative for environmental allergies.  All other systems reviewed and are negative.   All other systems reviewed and are negative.  The following is a summary of the past history medically, past history surgically, known current medicines, social history and family history.  This information is gathered electronically by the computer from prior information and documentation.  I review this each visit and have found including this information at this point in the chart is beneficial and informative.    Past Medical History:  Diagnosis Date  . Arthritis   . Chronic back pain     Past Surgical History:  Procedure Laterality Date  . CHOLECYSTECTOMY    . REPLACEMENT TOTAL KNEE BILATERAL    . TUBAL LIGATION      Family History  Family history unknown: Yes    Social History Social History   Tobacco Use  . Smoking status: Former Smoker    Packs/day: 0.50    Years: 20.00    Pack years: 10.00    Types: Cigarettes  . Smokeless tobacco: Never Used  Substance Use  Topics  . Alcohol use: No  . Drug use: No    Allergies  Allergen Reactions  . Penicillins Nausea And Vomiting    Current Outpatient Medications  Medication Sig Dispense Refill  . diclofenac (VOLTAREN) 75 MG EC tablet TAKE  (1)  TABLET TWICE A DAY. 60 tablet 5  . HYDROcodone-acetaminophen (NORCO) 7.5-325 MG tablet Take 1 tablet by mouth every 6 (six) hours as needed for moderate pain (Must last 30 days). 42 tablet 0   No current facility-administered medications for this visit.     Physical Exam  Blood pressure 114/77, pulse 75, height 5\' 6"  (1.676 m), weight 196 lb (88.9 kg).  Constitutional: overall normal hygiene, normal nutrition, well developed, normal grooming, normal body habitus. Assistive device:none  Musculoskeletal: gait and station Limp none, muscle tone and strength are normal, no tremors or atrophy is present.  .  Neurological: coordination overall normal.  Deep tendon reflex/nerve stretch intact.  Sensation normal.  Cranial nerves II-XII intact.   Skin:   Normal overall no scars, lesions, ulcers or rashes. No psoriasis.  Psychiatric: Alert and oriented x 3.  Recent memory intact, remote memory unclear.  Normal mood and affect. Well groomed.  Good eye contact.  Cardiovascular: overall no swelling, no varicosities, no edema bilaterally, normal temperatures of the legs and arms, no clubbing, cyanosis and good capillary refill.  Lymphatic: palpation is normal.  Spine/Pelvis examination:  Inspection:  Overall, sacoiliac joint benign and hips nontender; without  crepitus or defects.   Thoracic spine inspection: Alignment normal without kyphosis present   Lumbar spine inspection:  Alignment  with normal lumbar lordosis, without scoliosis apparent.   Thoracic spine palpation:  without tenderness of spinal processes   Lumbar spine palpation: without tenderness of lumbar area; without tightness of lumbar muscles    Range of Motion:   Lumbar flexion, forward  flexion is normal without pain or tenderness    Lumbar extension is full without pain or tenderness   Left lateral bend is normal without pain or tenderness   Right lateral bend is normal without pain or tenderness   Straight leg raising is normal  Strength & tone: normal   Stability overall normal stability  All other systems reviewed and are negative   The patient has been educated about the nature of the problem(s) and counseled on treatment options.  The patient appeared to understand what I have discussed and is in agreement with it.  Encounter Diagnosis  Name Primary?  . Chronic midline low back pain without sciatica Yes    PLAN Call if any problems.  Precautions discussed.  Continue current medications.   Return to clinic 3 months   Electronically Signed Sanjuana Kava, MD 5/18/202110:17 AM

## 2020-01-19 MED ORDER — HYDROCODONE-ACETAMINOPHEN 7.5-325 MG PO TABS: 1.0000 | ORAL_TABLET | Freq: Four times a day (QID) | ORAL | 0 refills | Status: DC | PRN

## 2020-02-17 ENCOUNTER — Other Ambulatory Visit: Payer: Self-pay | Admitting: Orthopaedic Surgery

## 2020-02-21 ENCOUNTER — Other Ambulatory Visit: Payer: Self-pay | Admitting: Orthopaedic Surgery

## 2020-02-21 MED ORDER — HYDROCODONE-ACETAMINOPHEN 7.5-325 MG PO TABS: 1.0000 | ORAL_TABLET | Freq: Four times a day (QID) | ORAL | 0 refills | Status: DC | PRN

## 2020-03-21 ENCOUNTER — Other Ambulatory Visit: Payer: Self-pay | Admitting: Orthopaedic Surgery

## 2020-03-21 MED ORDER — HYDROCODONE-ACETAMINOPHEN 7.5-325 MG PO TABS: 1.0000 | ORAL_TABLET | Freq: Four times a day (QID) | ORAL | 0 refills | Status: DC | PRN

## 2020-03-22 ENCOUNTER — Other Ambulatory Visit: Payer: Self-pay | Admitting: Orthopaedic Surgery

## 2020-04-18 ENCOUNTER — Ambulatory Visit: Payer: BC Managed Care – PPO | Admitting: Orthopaedic Surgery

## 2020-04-20 ENCOUNTER — Other Ambulatory Visit: Payer: Self-pay | Admitting: Orthopaedic Surgery

## 2020-04-20 ENCOUNTER — Ambulatory Visit: Payer: BC Managed Care – PPO | Admitting: Orthopaedic Surgery

## 2020-04-20 ENCOUNTER — Other Ambulatory Visit: Payer: Self-pay

## 2020-04-20 ENCOUNTER — Encounter: Payer: Self-pay | Admitting: Orthopaedic Surgery

## 2020-04-20 VITALS — BP 107/80 | HR 68 | Ht 67.0 in | Wt 183.2 lb

## 2020-04-20 DIAGNOSIS — M545 Low back pain: Secondary | ICD-10-CM

## 2020-04-20 DIAGNOSIS — G8929 Other chronic pain: Secondary | ICD-10-CM

## 2020-04-20 MED ORDER — HYDROCODONE-ACETAMINOPHEN 7.5-325 MG PO TABS: 1.0000 | ORAL_TABLET | Freq: Four times a day (QID) | ORAL | 0 refills | Status: DC | PRN

## 2020-04-20 NOTE — Progress Notes (Signed)
Patient [name], female DOB:12/27/1970, 49 y.o. JOA:[id number]  Chief Complaint  Patient presents with  . Back Pain    doing about the same    HPI  [name] is a 49 y.o. female who has lower back pain. She has good and bad days.  She has more pain with rainy weather and extra activity.  She has no weakness. She is taking her medicine.   Body mass index is 28.7 kg/m.  ROS  Review of Systems  Constitutional:       Patient does not have Diabetes Mellitus. Patient does not have hypertension. Patient does not have COPD or shortness of breath. Patient does not have BMI > 35. Patient has current smoking history.  HENT: Negative for congestion.   Respiratory: Negative for cough and shortness of breath.   Cardiovascular: Negative for chest pain and leg swelling.  Endocrine: Positive for cold intolerance.  Musculoskeletal: Positive for arthralgias and back pain.  Allergic/Immunologic: Negative for environmental allergies.  All other systems reviewed and are negative.   All other systems reviewed and are negative.  The following is a summary of the past history medically, past history surgically, known current medicines, social history and family history.  This information is gathered electronically by the computer from prior information and documentation.  I review this each visit and have found including this information at this point in the chart is beneficial and informative.    Past Medical History:  Diagnosis Date  . Arthritis   . Chronic back pain     Past Surgical History:  Procedure Laterality Date  . CHOLECYSTECTOMY    . REPLACEMENT TOTAL KNEE BILATERAL    . TUBAL LIGATION      Family History  Family history unknown: Yes    Social History Social History   Tobacco Use  . Smoking status: Former Smoker    Packs/day: 0.50    Years: 20.00    Pack years: 10.00    Types: Cigarettes  . Smokeless tobacco: Never Used  Substance Use Topics  .  Alcohol use: No  . Drug use: No    Allergies  Allergen Reactions  . Penicillins Nausea And Vomiting    Current Outpatient Medications  Medication Sig Dispense Refill  . diclofenac (VOLTAREN) 75 MG EC tablet TAKE  (1)  TABLET TWICE A DAY. 60 tablet 5  . HYDROcodone-acetaminophen (NORCO) 7.5-325 MG tablet Take 1 tablet by mouth every 6 (six) hours as needed for moderate pain (Must last 30 days). 40 tablet 0   No current facility-administered medications for this visit.     Physical Exam  Blood pressure 107/80, pulse 68, height 5\' 7"  (1.702 m), weight 183 lb 4 oz (83.1 kg).  Constitutional: overall normal hygiene, normal nutrition, well developed, normal grooming, normal body habitus. Assistive device:none  Musculoskeletal: gait and station Limp none, muscle tone and strength are normal, no tremors or atrophy is present.  .  Neurological: coordination overall normal.  Deep tendon reflex/nerve stretch intact.  Sensation normal.  Cranial nerves II-XII intact.   Skin:   Normal overall no scars, lesions, ulcers or rashes. No psoriasis.  Psychiatric: Alert and oriented x 3.  Recent memory intact, remote memory unclear.  Normal mood and affect. Well groomed.  Good eye contact.  Cardiovascular: overall no swelling, no varicosities, no edema bilaterally, normal temperatures of the legs and arms, no clubbing, cyanosis and good capillary refill.  Spine/Pelvis examination:  Inspection:  Overall, sacoiliac joint benign and hips nontender;  without crepitus or defects.   Thoracic spine inspection: Alignment normal without kyphosis present   Lumbar spine inspection:  Alignment  with normal lumbar lordosis, without scoliosis apparent.   Thoracic spine palpation:  without tenderness of spinal processes   Lumbar spine palpation: without tenderness of lumbar area; without tightness of lumbar muscles    Range of Motion:   Lumbar flexion, forward flexion is normal without pain or  tenderness    Lumbar extension is full without pain or tenderness   Left lateral bend is normal without pain or tenderness   Right lateral bend is normal without pain or tenderness   Straight leg raising is normal  Strength & tone: normal   Stability overall normal stability  Lymphatic: palpation is normal.  All other systems reviewed and are negative   The patient has been educated about the nature of the problem(s) and counseled on treatment options.  The patient appeared to understand what I have discussed and is in agreement with it.  Encounter Diagnosis  Name Primary?  . Chronic midline low back pain without sciatica Yes    PLAN Call if any problems.  Precautions discussed.  Continue current medications.   Return to clinic 3 months   Electronically Signed Darreld Mclean, MD 8/19/202110:26 AM

## 2020-05-21 ENCOUNTER — Other Ambulatory Visit: Payer: Self-pay | Admitting: Orthopaedic Surgery

## 2020-05-22 MED ORDER — HYDROCODONE-ACETAMINOPHEN 7.5-325 MG PO TABS
1.0000 | ORAL_TABLET | Freq: Four times a day (QID) | ORAL | 0 refills | Status: DC | PRN
Start: 2020-05-22 — End: 2020-06-19

## 2020-06-19 ENCOUNTER — Other Ambulatory Visit: Payer: Self-pay

## 2020-06-19 MED ORDER — HYDROCODONE-ACETAMINOPHEN 7.5-325 MG PO TABS
1.0000 | ORAL_TABLET | Freq: Four times a day (QID) | ORAL | 0 refills | Status: DC | PRN
Start: 2020-06-19 — End: 2020-07-17

## 2020-07-17 ENCOUNTER — Other Ambulatory Visit: Payer: Self-pay | Admitting: Orthopaedic Surgery

## 2020-07-18 MED ORDER — HYDROCODONE-ACETAMINOPHEN 7.5-325 MG PO TABS
1.0000 | ORAL_TABLET | Freq: Four times a day (QID) | ORAL | 0 refills | Status: DC | PRN
Start: 2020-07-18 — End: 2020-08-16

## 2020-07-20 ENCOUNTER — Ambulatory Visit (INDEPENDENT_AMBULATORY_CARE_PROVIDER_SITE_OTHER): Payer: BC Managed Care – PPO | Admitting: Orthopaedic Surgery

## 2020-07-20 ENCOUNTER — Encounter: Payer: Self-pay | Admitting: Orthopaedic Surgery

## 2020-07-20 ENCOUNTER — Other Ambulatory Visit: Payer: Self-pay

## 2020-07-20 DIAGNOSIS — M545 Low back pain, unspecified: Secondary | ICD-10-CM

## 2020-07-20 DIAGNOSIS — G8929 Other chronic pain: Secondary | ICD-10-CM

## 2020-07-20 NOTE — Progress Notes (Signed)
Virtual Visit via Telephone Note  I connected with@ on 07/20/20 at 10:30 AM EST by telephone and verified that I am speaking with the correct person using two identifiers.  Location: Patient: home Provider: office   I discussed the limitations, risks, security and privacy concerns of performing an evaluation and management service by telephone and the availability of in person appointments. I also discussed with the patient that there may be a patient responsible charge related to this service. The patient expressed understanding and agreed to proceed.   History of Present Illness: She has chronic lower back pain and has good and bad days.  She has more pain with cold days.  She has no weakness or new trauma, no weakness.  She is trying to do her exercises.   Observations/Objective: Per above.  Assessment and Plan: Encounter Diagnosis  Name Primary?  . Chronic midline low back pain without sciatica Yes      Follow Up Instructions: Continue exercises and medicine.  I will see in three months.   I discussed the assessment and treatment plan with the patient. The patient was provided an opportunity to ask questions and all were answered. The patient agreed with the plan and demonstrated an understanding of the instructions.   The patient was advised to call back or seek an in-person evaluation if the symptoms worsen or if the condition fails to improve as anticipated.  I provided 8 minutes of non-face-to-face time during this encounter.   Darreld Mclean, MD

## 2020-08-16 ENCOUNTER — Other Ambulatory Visit: Payer: Self-pay | Admitting: Orthopaedic Surgery

## 2020-08-17 MED ORDER — HYDROCODONE-ACETAMINOPHEN 7.5-325 MG PO TABS
1.0000 | ORAL_TABLET | Freq: Four times a day (QID) | ORAL | 0 refills | Status: DC | PRN
Start: 2020-08-17 — End: 2020-09-18

## 2020-08-17 NOTE — Telephone Encounter (Signed)
Hydrocodone-acetaminophen 7.5/325mg    One (1) tablet every six (6) hours as needed for moderate pain.  Patient uses [facility]

## 2020-09-16 ENCOUNTER — Other Ambulatory Visit: Payer: Self-pay

## 2020-09-18 ENCOUNTER — Telehealth: Payer: Self-pay

## 2020-09-19 ENCOUNTER — Telehealth: Payer: Self-pay | Admitting: Orthopaedic Surgery

## 2020-09-19 MED ORDER — HYDROCODONE-ACETAMINOPHEN 7.5-325 MG PO TABS
1.0000 | ORAL_TABLET | Freq: Four times a day (QID) | ORAL | 0 refills | Status: DC | PRN
Start: 2020-09-19 — End: 2020-10-16

## 2020-10-16 ENCOUNTER — Other Ambulatory Visit: Payer: Self-pay | Admitting: Orthopaedic Surgery

## 2020-10-16 MED ORDER — HYDROCODONE-ACETAMINOPHEN 7.5-325 MG PO TABS
1.0000 | ORAL_TABLET | Freq: Four times a day (QID) | ORAL | 0 refills | Status: DC | PRN
Start: 2020-10-16 — End: 2020-11-09

## 2020-10-19 ENCOUNTER — Encounter: Payer: Self-pay | Admitting: Orthopaedic Surgery

## 2020-10-19 ENCOUNTER — Other Ambulatory Visit: Payer: Self-pay

## 2020-10-19 ENCOUNTER — Ambulatory Visit (INDEPENDENT_AMBULATORY_CARE_PROVIDER_SITE_OTHER): Payer: BC Managed Care – PPO | Admitting: Orthopaedic Surgery

## 2020-10-19 DIAGNOSIS — G8929 Other chronic pain: Secondary | ICD-10-CM

## 2020-10-19 DIAGNOSIS — M545 Low back pain, unspecified: Secondary | ICD-10-CM

## 2020-10-19 NOTE — Progress Notes (Signed)
Virtual Visit via Telephone Note  I connected with@ on 10/19/20 at 10:20 AM EST by telephone and verified that I am speaking with the correct person using two identifiers.  Location: Patient: home Provider: [facility]   I discussed the limitations, risks, security and privacy concerns of performing an evaluation and management service by telephone and the availability of in person appointments. I also discussed with the patient that there may be a patient responsible charge related to this service. The patient expressed understanding and agreed to proceed.   History of Present Illness: She has chronic lower back pain. She has had more pain with the cold weather.  She has no new trauma, no weakness.  She is doing her exercises.   Observations/Objective: Per above.  Assessment and Plan: Encounter Diagnosis  Name Primary?  . Chronic midline low back pain without sciatica Yes     Follow Up Instructions: Three months.   I discussed the assessment and treatment plan with the patient. The patient was provided an opportunity to ask questions and all were answered. The patient agreed with the plan and demonstrated an understanding of the instructions.   The patient was advised to call back or seek an in-person evaluation if the symptoms worsen or if the condition fails to improve as anticipated.  I provided 8 minutes of non-face-to-face time during this encounter.   Darreld Mclean, MD

## 2020-11-09 ENCOUNTER — Telehealth: Payer: Self-pay

## 2020-11-09 MED ORDER — HYDROCODONE-ACETAMINOPHEN 7.5-325 MG PO TABS
1.0000 | ORAL_TABLET | Freq: Four times a day (QID) | ORAL | 0 refills | Status: DC | PRN
Start: 2020-11-09 — End: 2020-12-05

## 2020-11-10 ENCOUNTER — Telehealth: Payer: Self-pay | Admitting: Orthopaedic Surgery

## 2020-12-05 ENCOUNTER — Telehealth: Payer: Self-pay | Admitting: Orthopaedic Surgery

## 2020-12-06 MED ORDER — HYDROCODONE-ACETAMINOPHEN 7.5-325 MG PO TABS
1.0000 | ORAL_TABLET | Freq: Four times a day (QID) | ORAL | 0 refills | Status: DC | PRN
Start: 2020-12-06 — End: 2021-01-01

## 2021-01-01 ENCOUNTER — Telehealth: Payer: Self-pay | Admitting: Orthopaedic Surgery

## 2021-01-01 MED ORDER — HYDROCODONE-ACETAMINOPHEN 7.5-325 MG PO TABS: 1.0000 | ORAL_TABLET | Freq: Four times a day (QID) | ORAL | 0 refills | Status: DC | PRN

## 2021-01-18 ENCOUNTER — Encounter: Payer: Self-pay | Admitting: Orthopaedic Surgery

## 2021-01-18 ENCOUNTER — Ambulatory Visit (INDEPENDENT_AMBULATORY_CARE_PROVIDER_SITE_OTHER): Payer: BC Managed Care – PPO | Admitting: Orthopaedic Surgery

## 2021-01-18 ENCOUNTER — Other Ambulatory Visit: Payer: Self-pay

## 2021-01-18 DIAGNOSIS — G8929 Other chronic pain: Secondary | ICD-10-CM

## 2021-01-18 DIAGNOSIS — M545 Low back pain, unspecified: Secondary | ICD-10-CM | POA: Diagnosis not present

## 2021-01-18 NOTE — Progress Notes (Signed)
Virtual Visit via Telephone Note  I connected with@ on 01/18/21 at 10:10 AM EDT by telephone and verified that I am speaking with the correct person using two identifiers.  Location: Patient: home Provider: [facility]   I discussed the limitations, risks, security and privacy concerns of performing an evaluation and management service by telephone and the availability of in person appointments. I also discussed with the patient that there may be a patient responsible charge related to this service. The patient expressed understanding and agreed to proceed.   History of Present Illness: She has chronic lower back pain.  She is slightly better with the warmer weather.  She has no new trauma, no falls and no weakness.  She is doing her exercises and taking her medicine but still has the pain.   Observations/Objective: Per above.  Assessment and Plan: Encounter Diagnosis  Name Primary?  . Chronic midline low back pain without sciatica Yes     Follow Up Instructions: Three months, can be virtual   I discussed the assessment and treatment plan with the patient. The patient was provided an opportunity to ask questions and all were answered. The patient agreed with the plan and demonstrated an understanding of the instructions.   The patient was advised to call back or seek an in-person evaluation if the symptoms worsen or if the condition fails to improve as anticipated.  I provided 8 minutes of non-face-to-face time during this encounter.   Darreld Mclean, MD

## 2021-02-01 ENCOUNTER — Telehealth: Payer: Self-pay | Admitting: Orthopaedic Surgery

## 2021-02-01 MED ORDER — HYDROCODONE-ACETAMINOPHEN 7.5-325 MG PO TABS: 1.0000 | ORAL_TABLET | Freq: Four times a day (QID) | ORAL | 0 refills | Status: DC | PRN

## 2021-03-03 ENCOUNTER — Other Ambulatory Visit: Payer: Self-pay | Admitting: Orthopaedic Surgery

## 2021-03-06 ENCOUNTER — Telehealth: Payer: Self-pay | Admitting: Orthopaedic Surgery

## 2021-03-06 MED ORDER — HYDROCODONE-ACETAMINOPHEN 7.5-325 MG PO TABS: 1.0000 | ORAL_TABLET | Freq: Four times a day (QID) | ORAL | 0 refills | Status: DC | PRN

## 2021-04-01 ENCOUNTER — Telehealth: Payer: Self-pay | Admitting: Orthopaedic Surgery

## 2021-04-03 MED ORDER — HYDROCODONE-ACETAMINOPHEN 7.5-325 MG PO TABS: 1.0000 | ORAL_TABLET | Freq: Four times a day (QID) | ORAL | 0 refills | Status: DC | PRN

## 2021-04-19 ENCOUNTER — Ambulatory Visit (INDEPENDENT_AMBULATORY_CARE_PROVIDER_SITE_OTHER): Payer: BC Managed Care – PPO | Admitting: Orthopaedic Surgery

## 2021-04-19 ENCOUNTER — Encounter: Payer: Self-pay | Admitting: Orthopaedic Surgery

## 2021-04-19 ENCOUNTER — Other Ambulatory Visit: Payer: Self-pay

## 2021-04-19 DIAGNOSIS — G8929 Other chronic pain: Secondary | ICD-10-CM | POA: Diagnosis not present

## 2021-04-19 DIAGNOSIS — M545 Low back pain, unspecified: Secondary | ICD-10-CM | POA: Diagnosis not present

## 2021-04-19 NOTE — Progress Notes (Signed)
Virtual Visit via Telephone Note  I connected withNAME@ on 04/19/21 at 10:20 AM EDT by telephone and verified that I am speaking with the correct person using two identifiers.  Location: Patient: home Provider: office, [place]   I discussed the limitations, risks, security and privacy concerns of performing an evaluation and management service by telephone and the availability of in person appointments. I also discussed with the patient that there may be a patient responsible charge related to this service. The patient expressed understanding and agreed to proceed.   History of Present Illness: Her back has good and bad days depending on weather and her activity.  She is trying to be active.  She is taking her medicine. She has no new trauma   Observations/Objective: Per above.  Assessment and Plan: Encounter Diagnosis  Name Primary?   Chronic midline low back pain without sciatica Yes      Follow Up Instructions: Three months.  Continue present medicine.   I discussed the assessment and treatment plan with the patient. The patient was provided an opportunity to ask questions and all were answered. The patient agreed with the plan and demonstrated an understanding of the instructions.   The patient was advised to call back or seek an in-person evaluation if the symptoms worsen or if the condition fails to improve as anticipated.  I provided 8 minutes of non-face-to-face time during this encounter.   Darreld Mclean, MD

## 2021-05-01 ENCOUNTER — Other Ambulatory Visit: Payer: Self-pay | Admitting: Orthopaedic Surgery

## 2021-05-01 MED ORDER — HYDROCODONE-ACETAMINOPHEN 7.5-325 MG PO TABS: 1.0000 | ORAL_TABLET | Freq: Four times a day (QID) | ORAL | 0 refills | Status: DC | PRN

## 2021-05-29 ENCOUNTER — Telehealth: Payer: Self-pay | Admitting: Orthopaedic Surgery

## 2021-05-30 MED ORDER — HYDROCODONE-ACETAMINOPHEN 7.5-325 MG PO TABS: 1.0000 | ORAL_TABLET | Freq: Four times a day (QID) | ORAL | 0 refills | Status: DC | PRN

## 2021-06-27 ENCOUNTER — Other Ambulatory Visit: Payer: Self-pay | Admitting: Orthopaedic Surgery

## 2021-06-28 MED ORDER — HYDROCODONE-ACETAMINOPHEN 7.5-325 MG PO TABS: 1.0000 | ORAL_TABLET | Freq: Four times a day (QID) | ORAL | 0 refills | Status: DC | PRN

## 2021-07-19 ENCOUNTER — Encounter: Payer: Self-pay | Admitting: Orthopaedic Surgery

## 2021-07-19 ENCOUNTER — Other Ambulatory Visit: Payer: Self-pay

## 2021-07-19 ENCOUNTER — Ambulatory Visit (INDEPENDENT_AMBULATORY_CARE_PROVIDER_SITE_OTHER): Payer: BC Managed Care – PPO | Admitting: Orthopaedic Surgery

## 2021-07-19 DIAGNOSIS — M545 Low back pain, unspecified: Secondary | ICD-10-CM

## 2021-07-19 DIAGNOSIS — G8929 Other chronic pain: Secondary | ICD-10-CM

## 2021-07-19 NOTE — Progress Notes (Signed)
Virtual Visit via Telephone Note  I connected withNAME@ on 07/19/21 at 10:00 AM EST by telephone and verified that I am speaking with the correct person using two identifiers.  Location: Patient: home Provider: office   I discussed the limitations, risks, security and privacy concerns of performing an evaluation and management service by telephone and the availability of in person appointments. I also discussed with the patient that there may be a patient responsible charge related to this service. The patient expressed understanding and agreed to proceed.   History of Present Illness: Her back pain is stable. She has good and bad days. She has had more bad days with the cold weather. She has no trauma, no weakness.  She is taking her medicine and doing her exercises.   Observations/Objective: Per above.  Assessment and Plan: Encounter Diagnosis  Name Primary?   Chronic midline low back pain without sciatica Yes     Follow Up Instructions: Three months.  Continue medicine.   I discussed the assessment and treatment plan with the patient. The patient was provided an opportunity to ask questions and all were answered. The patient agreed with the plan and demonstrated an understanding of the instructions.   The patient was advised to call back or seek an in-person evaluation if the symptoms worsen or if the condition fails to improve as anticipated.  I provided 8 minutes of non-face-to-face time during this encounter.   Darreld Mclean, MD

## 2021-07-30 ENCOUNTER — Other Ambulatory Visit: Payer: Self-pay | Admitting: Orthopedic Surgery

## 2021-07-30 MED ORDER — HYDROCODONE-ACETAMINOPHEN 7.5-325 MG PO TABS: 1.0000 | ORAL_TABLET | Freq: Four times a day (QID) | ORAL | 0 refills | Status: DC | PRN

## 2021-08-06 DIAGNOSIS — R059 Cough, unspecified: Secondary | ICD-10-CM | POA: Diagnosis not present

## 2021-08-06 DIAGNOSIS — R5383 Other fatigue: Secondary | ICD-10-CM | POA: Diagnosis not present

## 2021-08-06 DIAGNOSIS — R0602 Shortness of breath: Secondary | ICD-10-CM | POA: Diagnosis not present

## 2021-08-06 DIAGNOSIS — R519 Headache, unspecified: Secondary | ICD-10-CM | POA: Diagnosis not present

## 2021-08-27 ENCOUNTER — Other Ambulatory Visit: Payer: Self-pay | Admitting: Orthopedic Surgery

## 2021-08-28 MED ORDER — HYDROCODONE-ACETAMINOPHEN 7.5-325 MG PO TABS: 1.0000 | ORAL_TABLET | Freq: Four times a day (QID) | ORAL | 0 refills | Status: DC | PRN

## 2021-09-19 ENCOUNTER — Emergency Department (HOSPITAL_COMMUNITY)
Admission: EM | Admit: 2021-09-19 | Discharge: 2021-09-20 | Disposition: A | Discharge status: Home / Self Care | Payer: BC Managed Care – PPO | Attending: Emergency Medicine | Admitting: Emergency Medicine

## 2021-09-19 ENCOUNTER — Other Ambulatory Visit: Payer: Self-pay

## 2021-09-19 ENCOUNTER — Encounter (HOSPITAL_COMMUNITY): Payer: Self-pay | Admitting: Emergency Medicine

## 2021-09-19 DIAGNOSIS — R0602 Shortness of breath: Secondary | ICD-10-CM | POA: Diagnosis not present

## 2021-09-19 DIAGNOSIS — R0789 Other chest pain: Secondary | ICD-10-CM | POA: Diagnosis not present

## 2021-09-19 DIAGNOSIS — J189 Pneumonia, unspecified organism: Secondary | ICD-10-CM

## 2021-09-19 DIAGNOSIS — J181 Lobar pneumonia, unspecified organism: Secondary | ICD-10-CM | POA: Insufficient documentation

## 2021-09-19 DIAGNOSIS — U071 COVID-19: Secondary | ICD-10-CM | POA: Diagnosis not present

## 2021-09-19 DIAGNOSIS — J90 Pleural effusion, not elsewhere classified: Secondary | ICD-10-CM | POA: Diagnosis not present

## 2021-09-19 DIAGNOSIS — J1282 Pneumonia due to coronavirus disease 2019: Secondary | ICD-10-CM | POA: Diagnosis not present

## 2021-09-19 DIAGNOSIS — R1031 Right lower quadrant pain: Secondary | ICD-10-CM | POA: Insufficient documentation

## 2021-09-19 NOTE — ED Triage Notes (Signed)
Pt c/o fatigue, loss of appetite, dizziness, and nausea since Dec 5th. States she was seen at [facility] in early Dec and diagnosed with pneumonia.

## 2021-09-20 ENCOUNTER — Emergency Department (HOSPITAL_COMMUNITY): Payer: BC Managed Care – PPO

## 2021-09-20 DIAGNOSIS — J90 Pleural effusion, not elsewhere classified: Secondary | ICD-10-CM | POA: Diagnosis not present

## 2021-09-20 DIAGNOSIS — R0602 Shortness of breath: Secondary | ICD-10-CM | POA: Diagnosis not present

## 2021-09-20 LAB — CBC WITH DIFFERENTIAL/PLATELET
Abs Immature Granulocytes: 0.03 10*3/uL (ref 0.00–0.07)
Basophils Absolute: 0 10*3/uL (ref 0.0–0.1)
Basophils Relative: 0 %
Eosinophils Absolute: 0 10*3/uL (ref 0.0–0.5)
Eosinophils Relative: 0 %
HCT: 26.7 % — ABNORMAL LOW (ref 36.0–46.0)
Hemoglobin: 7.6 g/dL — ABNORMAL LOW (ref 12.0–15.0)
Immature Granulocytes: 1 %
Lymphocytes Relative: 22 %
Lymphs Abs: 1.1 10*3/uL (ref 0.7–4.0)
MCH: 25.2 pg — ABNORMAL LOW (ref 26.0–34.0)
MCHC: 28.5 g/dL — ABNORMAL LOW (ref 30.0–36.0)
MCV: 88.4 fL (ref 80.0–100.0)
Monocytes Absolute: 0.3 10*3/uL (ref 0.1–1.0)
Monocytes Relative: 7 %
Neutro Abs: 3.4 10*3/uL (ref 1.7–7.7)
Neutrophils Relative %: 70 %
Platelets: 279 10*3/uL (ref 150–400)
RBC: 3.02 MIL/uL — ABNORMAL LOW (ref 3.87–5.11)
RDW: 18.5 % — ABNORMAL HIGH (ref 11.5–15.5)
WBC: 4.8 10*3/uL (ref 4.0–10.5)
nRBC: 0 % (ref 0.0–0.2)

## 2021-09-20 LAB — COMPREHENSIVE METABOLIC PANEL
ALT: 13 U/L (ref 0–44)
AST: 22 U/L (ref 15–41)
Albumin: 2.6 g/dL — ABNORMAL LOW (ref 3.5–5.0)
Alkaline Phosphatase: 131 U/L — ABNORMAL HIGH (ref 38–126)
Anion gap: 9 (ref 5–15)
BUN: 16 mg/dL (ref 6–20)
CO2: 21 mmol/L — ABNORMAL LOW (ref 22–32)
Calcium: 8.7 mg/dL — ABNORMAL LOW (ref 8.9–10.3)
Chloride: 107 mmol/L (ref 98–111)
Creatinine, Ser: 0.92 mg/dL (ref 0.44–1.00)
GFR, Estimated: >60 mL/min (ref >60)
Glucose, Bld: 87 mg/dL (ref 70–99)
Potassium: 4.7 mmol/L (ref 3.5–5.1)
Sodium: 137 mmol/L (ref 135–145)
Total Bilirubin: 0.3 mg/dL (ref 0.3–1.2)
Total Protein: 7.5 g/dL (ref 6.5–8.1)

## 2021-09-20 LAB — RESP PANEL BY RT-PCR (FLU A&B, COVID) ARPGX2
Influenza A by PCR: NEGATIVE
Influenza B by PCR: NEGATIVE
SARS Coronavirus 2 by RT PCR: POSITIVE — AB

## 2021-09-20 LAB — TROPONIN I (HIGH SENSITIVITY)
Troponin I (High Sensitivity): 4 ng/L (ref <18)
Troponin I (High Sensitivity): 3 ng/L (ref <18)

## 2021-09-20 LAB — LIPASE, BLOOD: Lipase: 21 U/L (ref 11–51)

## 2021-09-20 MED ORDER — AZITHROMYCIN 250 MG PO TABS: 250.0000 mg | ORAL_TABLET | Freq: Every day | ORAL | 0 refills | Status: DC

## 2021-09-20 MED ORDER — CEFDINIR 300 MG PO CAPS: 300.0000 mg | ORAL_CAPSULE | Freq: Two times a day (BID) | ORAL | 0 refills | Status: DC

## 2021-09-20 NOTE — ED Provider Notes (Signed)
[facility] EMERGENCY DEPARTMENT Provider Note   CSN: [id number] Arrival date & time: 09/19/21  2340     History  Chief Complaint  Patient presents with   Multiple Complaints    [name] is a 51 y.o. female.  Patient presents to the emergency department with multiple complaints.  Patient reports that she has been feeling poorly for a month and a half.  When she first started to feel sick she went to urgent care and was told she had pneumonia, was treated.  She did not improve.  Patient continues to have shortness of breath, chest pains, abdominal pain, loss of appetite.  Patient reports increased fatigue, dizziness and was unable to work yesterday and today.      Home Medications Prior to Admission medications   Medication Sig Start Date End Date Taking? Authorizing Provider  diclofenac (VOLTAREN) 75 MG EC tablet TAKE  (1)  TABLET TWICE A DAY. 11/13/20   Darreld Mclean, MD  HYDROcodone-acetaminophen (NORCO) 7.5-325 MG tablet Take 1 tablet by mouth every 6 (six) hours as needed for moderate pain (Must last 30 days). 08/28/21   Oliver Barre, MD      Allergies    Penicillins    Review of Systems   Review of Systems  Respiratory:  Positive for cough and shortness of breath.   Gastrointestinal:  Positive for abdominal pain.  Neurological:  Positive for dizziness.   Physical Exam Updated Vital Signs BP 110/72    Pulse 66    Temp 98.2 F (36.8 C) (Oral)    Resp (!) 22    Ht 5\' 7"  (1.702 m)    Wt 83.1 kg    SpO2 98%    BMI 28.69 kg/m  Physical Exam Vitals and nursing note reviewed.  Constitutional:      General: She is not in acute distress.    Appearance: Normal appearance. She is well-developed.  HENT:     Head: Normocephalic and atraumatic.     Right Ear: Hearing normal.     Left Ear: Hearing normal.     Nose: Nose normal.  Eyes:     Conjunctiva/sclera: Conjunctivae normal.     Pupils: Pupils are equal, round, and reactive to light.  Cardiovascular:      Rate and Rhythm: Regular rhythm.     Heart sounds: S1 normal and S2 normal. No murmur heard.   No friction rub. No gallop.  Pulmonary:     Effort: Pulmonary effort is normal. No respiratory distress.     Breath sounds: Normal breath sounds.  Chest:     Chest wall: Tenderness present.    Abdominal:     General: Bowel sounds are normal.     Palpations: Abdomen is soft.     Tenderness: There is abdominal tenderness in the right upper quadrant, epigastric area and left upper quadrant. There is no guarding or rebound. Negative signs include Murphy's sign and McBurney's sign.     Hernia: No hernia is present.  Musculoskeletal:        General: Normal range of motion.     Cervical back: Normal range of motion and neck supple.  Skin:    General: Skin is warm and dry.     Findings: No rash.  Neurological:     Mental Status: She is alert and oriented to person, place, and time.     GCS: GCS eye subscore is 4. GCS verbal subscore is 5. GCS motor subscore is 6.  Cranial Nerves: No cranial nerve deficit.     Sensory: No sensory deficit.     Coordination: Coordination normal.  Psychiatric:        Speech: Speech normal.        Behavior: Behavior normal.        Thought Content: Thought content normal.    ED Results / Procedures / Treatments   Labs (all labs ordered are listed, but only abnormal results are displayed) Labs Reviewed  RESP PANEL BY RT-PCR (FLU A&B, COVID) ARPGX2 - Abnormal; Notable for the following components:      Result Value   SARS Coronavirus 2 by RT PCR POSITIVE (*)    All other components within normal limits  CBC WITH DIFFERENTIAL/PLATELET - Abnormal; Notable for the following components:   RBC 3.02 (*)    Hemoglobin 7.6 (*)    HCT 26.7 (*)    MCH 25.2 (*)    MCHC 28.5 (*)    RDW 18.5 (*)    All other components within normal limits  COMPREHENSIVE METABOLIC PANEL - Abnormal; Notable for the following components:   CO2 21 (*)    Calcium 8.7 (*)    Albumin  2.6 (*)    Alkaline Phosphatase 131 (*)    All other components within normal limits  LIPASE, BLOOD  TROPONIN I (HIGH SENSITIVITY)  TROPONIN I (HIGH SENSITIVITY)    EKG EKG Interpretation  Date/Time:  Thursday September 20 2021 00:29:51 EST Ventricular Rate:  66 PR Interval:  155 QRS Duration: 88 QT Interval:  397 QTC Calculation: 416 R Axis:   68 Text Interpretation: Sinus rhythm Probable LVH with secondary repol abnrm Confirmed by Gilda Crease [id number]) on 09/20/2021 12:45:23 AM  Radiology DG Chest 2 View  Result Date: 09/20/2021 CLINICAL DATA:  shortness of breath EXAM: CHEST - 2 VIEW COMPARISON:  None. FINDINGS: The heart and mediastinal contours are within normal limits. Patchy left costophrenic angle airspace opacities. No focal consolidation. No pulmonary edema. Trace left pleural effusion. No right pleural effusion. No pneumothorax. No acute osseous abnormality. IMPRESSION: Patchy left costophrenic angle airspace opacities. Trace left pleural effusion. Followup PA and lateral chest X-ray is recommended in 3-4 weeks following therapy to ensure resolution and exclude underlying malignancy. Electronically Signed   By: Tish Frederickson M.D.   On: 09/20/2021 01:19    Procedures Procedures    Medications Ordered in ED Medications - No data to display  ED Course/ Medical Decision Making/ A&P                           Medical Decision Making Amount and/or Complexity of Data Reviewed Labs: ordered. Radiology: ordered.   Patient presents to the emergency department for evaluation of multiple problems.  Patient complaining of chest pain, shortness of breath, abdominal pain, generalized weakness, dizziness.  Symptoms have been ongoing for more than a month.  Work-up was initiated including cardiac evaluation, basic labs.  Patient's EKG does not show any ischemic changes.  Troponin negative x2.  Chest x-ray, independently evaluated and interpreted by myself, with left  costophrenic angle airspace opacities.  Patient did test positive for COVID.  Based on the longevity of her symptoms, unclear this is the cause of her symptoms tonight.  Also unclear how long she has had symptoms, therefore not likely to benefit from antivirals.  Patient's vitals unremarkable.  No hypoxia.  She appears well.  No indication for admission.  As the x-ray findings would be  atypical for COVID pneumonitis, will treat with antibiotics and have repeat x-ray in 3 to 4 weeks as recommended by radiology.        Final Clinical Impression(s) / ED Diagnoses Final diagnoses:  COVID-19  Community acquired pneumonia of left lower lobe of lung    Rx / DC Orders ED Discharge Orders     None         Okley Magnussen, Canary Brimhristopher J, MD 09/20/21 [phone]

## 2021-09-27 ENCOUNTER — Telehealth: Payer: Self-pay | Admitting: Orthopedic Surgery

## 2021-09-27 MED ORDER — HYDROCODONE-ACETAMINOPHEN 7.5-325 MG PO TABS: 1.0000 | ORAL_TABLET | Freq: Four times a day (QID) | ORAL | 0 refills | Status: DC | PRN

## 2021-09-28 ENCOUNTER — Telehealth: Payer: Self-pay | Admitting: Orthopaedic Surgery

## 2021-10-16 ENCOUNTER — Encounter (HOSPITAL_COMMUNITY): Payer: Self-pay | Admitting: Radiology

## 2021-10-18 ENCOUNTER — Ambulatory Visit (INDEPENDENT_AMBULATORY_CARE_PROVIDER_SITE_OTHER): Payer: BC Managed Care – PPO | Admitting: Orthopaedic Surgery

## 2021-10-18 ENCOUNTER — Other Ambulatory Visit: Payer: Self-pay

## 2021-10-18 ENCOUNTER — Encounter: Payer: Self-pay | Admitting: Orthopaedic Surgery

## 2021-10-18 DIAGNOSIS — M545 Low back pain, unspecified: Secondary | ICD-10-CM | POA: Diagnosis not present

## 2021-10-18 DIAGNOSIS — G8929 Other chronic pain: Secondary | ICD-10-CM | POA: Diagnosis not present

## 2021-10-18 NOTE — Progress Notes (Signed)
Virtual Visit via Telephone Note  I connected withNAME@ on 10/18/21 at 10:20 AM EST by telephone and verified that I am speaking with the correct person using two identifiers.  Location: Patient: home Provider: office   I discussed the limitations, risks, security and privacy concerns of performing an evaluation and management service by telephone and the availability of in person appointments. I also discussed with the patient that there may be a patient responsible charge related to this service. The patient expressed understanding and agreed to proceed.   History of Present Illness: Her back is stable.  She has good and bad days depending on activity and the weather. She is taking her medicine and doing her exercises.  She has no weakness, no numbness, no new trauma   Observations/Objective: Per above.  Assessment and Plan: Encounter Diagnosis  Name Primary?   Chronic midline low back pain without sciatica Yes      Follow Up Instructions: Continue as she is doing.  Continue exercises and medicine.   I discussed the assessment and treatment plan with the patient. The patient was provided an opportunity to ask questions and all were answered. The patient agreed with the plan and demonstrated an understanding of the instructions.   The patient was advised to call back or seek an in-person evaluation if the symptoms worsen or if the condition fails to improve as anticipated.  I provided 8 minutes of non-face-to-face time during this encounter.   Darreld Mclean, MD

## 2021-10-24 ENCOUNTER — Emergency Department (HOSPITAL_COMMUNITY)
Admission: EM | Admit: 2021-10-24 | Discharge: 2021-10-24 | Disposition: A | Discharge status: Home / Self Care | Payer: BC Managed Care – PPO | Attending: Emergency Medicine | Admitting: Emergency Medicine

## 2021-10-24 ENCOUNTER — Telehealth: Payer: Self-pay | Admitting: Orthopaedic Surgery

## 2021-10-24 ENCOUNTER — Other Ambulatory Visit: Payer: Self-pay

## 2021-10-24 ENCOUNTER — Encounter (HOSPITAL_COMMUNITY): Payer: Self-pay | Admitting: *Deleted

## 2021-10-24 DIAGNOSIS — M25561 Pain in right knee: Secondary | ICD-10-CM | POA: Diagnosis not present

## 2021-10-24 DIAGNOSIS — M79642 Pain in left hand: Secondary | ICD-10-CM | POA: Diagnosis not present

## 2021-10-24 DIAGNOSIS — M79641 Pain in right hand: Secondary | ICD-10-CM | POA: Diagnosis not present

## 2021-10-24 DIAGNOSIS — L301 Dyshidrosis [pompholyx]: Secondary | ICD-10-CM | POA: Insufficient documentation

## 2021-10-24 DIAGNOSIS — M25562 Pain in left knee: Secondary | ICD-10-CM | POA: Insufficient documentation

## 2021-10-24 DIAGNOSIS — Z79899 Other long term (current) drug therapy: Secondary | ICD-10-CM | POA: Diagnosis not present

## 2021-10-24 DIAGNOSIS — M79622 Pain in left upper arm: Secondary | ICD-10-CM

## 2021-10-24 DIAGNOSIS — M79603 Pain in arm, unspecified: Secondary | ICD-10-CM | POA: Diagnosis not present

## 2021-10-24 DIAGNOSIS — M79621 Pain in right upper arm: Secondary | ICD-10-CM

## 2021-10-24 DIAGNOSIS — L03111 Cellulitis of right axilla: Secondary | ICD-10-CM | POA: Diagnosis not present

## 2021-10-24 MED ORDER — PREDNISONE 20 MG PO TABS: 40.0000 mg | ORAL_TABLET | Freq: Every day | ORAL | 0 refills | Status: DC

## 2021-10-24 MED ORDER — TRIAMCINOLONE ACETONIDE 0.1 % EX CREA: TOPICAL_CREAM | CUTANEOUS | 0 refills | Status: DC

## 2021-10-24 MED ORDER — METHOCARBAMOL 500 MG PO TABS: 500.0000 mg | ORAL_TABLET | Freq: Three times a day (TID) | ORAL | 0 refills | Status: DC

## 2021-10-24 NOTE — ED Triage Notes (Signed)
Pt c/o bilateral knee/leg pain and swelling x 2-3 weeks. Pt also c/o bilateral upper arm pain x 2-3 weeks, worsening since last night. Denies injury.

## 2021-10-24 NOTE — ED Provider Notes (Signed)
[facility] Provider Note   CSN: [id number] Arrival date & time: 10/24/21  [id number]     History  Chief Complaint  Patient presents with   Knee Pain    [name] is a 51 y.o. female.   Knee Pain      [name] is a 51 y.o. female who presents to the Emergency Department complaining of rash to both hands, bilateral knee pain and bilateral upper arm pain.  Symptoms present for 3 weeks.  She is having some some of her knees as well.  Pain has worsened since last night.  Denies known injury but states that she has to stand and walk a lot at her job.  She is having some itching of her hands and stiffness of her fingers with gripping.  She denies fever, chills, rash to her body, soles of her feet or palms of her hands.    Home Medications Prior to Admission medications   Medication Sig Start Date End Date Taking? Authorizing Provider  methocarbamol (ROBAXIN) 500 MG tablet Take 1 tablet (500 mg total) by mouth 3 (three) times daily. 10/24/21  Yes Aubrey Voong, PA-C  predniSONE (DELTASONE) 20 MG tablet Take 2 tablets (40 mg total) by mouth daily. 10/24/21  Yes Samiha Denapoli, PA-C  triamcinolone cream (KENALOG) 0.1 % Apply small amount to the affected areas of both hands twice daily 10/24/21  Yes Abem Shaddix, PA-C  azithromycin (ZITHROMAX) 250 MG tablet Take 1 tablet (250 mg total) by mouth daily. Take first 2 tablets together, then 1 every day until finished. 09/20/21   Gilda CreasePollina, Christopher J, MD  cefdinir (OMNICEF) 300 MG capsule Take 1 capsule (300 mg total) by mouth 2 (two) times daily. 09/20/21   Gilda CreasePollina, Christopher J, MD  diclofenac (VOLTAREN) 75 MG EC tablet TAKE (1) TABLET TWICE A DAY. 10/01/21   Darreld McleanKeeling, Wayne, MD  HYDROcodone-acetaminophen (NORCO) 7.5-325 MG tablet Take 1 tablet by mouth every 6 (six) hours as needed for moderate pain (Must last 30 days). 09/27/21   Darreld McleanKeeling, Wayne, MD      Allergies    Penicillins    Review of Systems    Review of Systems  Arthralgias, rash to both hands  All systems otherwise negative  Physical Exam Updated Vital Signs BP 116/83 (BP Location: Right Arm)    Pulse 78    Temp 98.5 F (36.9 C) (Oral)    Resp 20    Ht 5' 7.5" (1.715 m)    Wt 68 kg    LMP 09/16/2014 Comment: NEG U PREG 1/312/16   SpO2 100%    BMI 23.13 kg/m  Physical Exam Vitals and nursing note reviewed.  Constitutional:      General: She is not in acute distress.    Appearance: Normal appearance. She is not toxic-appearing.  Cardiovascular:     Rate and Rhythm: Normal rate and regular rhythm.     Pulses: Normal pulses.  Pulmonary:     Effort: Pulmonary effort is normal.     Breath sounds: Normal breath sounds.  Musculoskeletal:        General: Tenderness present. No swelling or signs of injury.     Right lower leg: No edema.     Left lower leg: No edema.     Comments: Pain through range of motion of bilateral knees.  There is a patellar click noted of the left knee with flexion and extension.  No appreciable edema.  No excessive warmth or erythema of the  joints.  No significant edema of the lower extremities.  Patient also has pain through range of motion of the bilateral upper arms. No skin changes or edema noted  Skin:    General: Skin is warm.     Capillary Refill: Capillary refill takes less than 2 seconds.     Findings: Rash present. No bruising or erythema.     Comments: Scattered pustules over the DIP and PIP joints of the fingers of both hands.  There is a single pustule noted at the lateral right wrist as well.  Mild erythema noted.  No edema of the joints.  Patient has full range of motion of the fingers of both hands.  Neurological:     General: No focal deficit present.     Mental Status: She is alert.     Sensory: No sensory deficit.     Motor: No weakness.       ED Results / Procedures / Treatments   Labs (all labs ordered are listed, but only abnormal results are displayed) Labs Reviewed -  No data to display  EKG None  Radiology No results found.  Procedures Procedures    Medications Ordered in ED Medications - No data to display  ED Course/ Medical Decision Making/ A&P                           Medical Decision Making Patient here with bilateral knee and bilateral shoulder/upper arm pain pain has been persistent for 2 to 3 weeks.  Worse with movement.  She is also noted a pustular rash to the joints of the fingers both hands.  Denies any fevers, chills, significant pain of her finger joints.  She has full range of motion of her fingers.  There is some mild pruritus of her hands.  Rash does not involve the palms or soles of her feet.  No known exposures to new chemicals or detergents   Risk Prescription drug management.   Patient is well-appearing.  Her vital signs are very reassuring.  She does not appear toxic.  There is no symptoms suggestive of septic joint and I suspect this is secondary to inflammatory process given that symptoms are of bilateral upper and lower extremities.  No injury to suggest need for imaging.  Pustules of the fingers without rash to the palms of the hands or soles of the feet.  No history of diabetes or HIV.  No other symptoms suggestive of STD.  I suspect the rash is related to dyshidrotic eczema.  We will try topical steroid.  She currently takes oral anti-inflammatory and narcotic prescribed by her orthopedist.  I do not feel additional narcotic is indicated at this time.  I explained that she may need additional work-up for her rash if symptoms do not improve with topical steroid.  She verbalized understanding agrees to plan.  Appears appropriate for discharge home.  All questions answered.         Final Clinical Impression(s) / ED Diagnoses Final diagnoses:  Acute pain of both knees  Pain in both upper arms  Dyshidrotic eczema    Rx / DC Orders ED Discharge Orders          Ordered    triamcinolone cream (KENALOG) 0.1 %         10/24/21 1058    predniSONE (DELTASONE) 20 MG tablet  Daily        10/24/21 1058    methocarbamol (ROBAXIN) 500  MG tablet  3 times daily        10/24/21 [address], PA-C 10/24/21 2327    Blane Ohara, MD 10/28/21 2356

## 2021-10-24 NOTE — Discharge Instructions (Signed)
Take medication as directed.  Apply the cream as directed directly to your fingers.  If your symptoms are not improving in several days, you may need follow-up with primary care.  I have listed some resources for you as well as a dermatologist to follow-up with if needed.  Return to the emergency department for any new or worsening symptoms.

## 2021-10-25 MED ORDER — HYDROCODONE-ACETAMINOPHEN 7.5-325 MG PO TABS: 1.0000 | ORAL_TABLET | Freq: Four times a day (QID) | ORAL | 0 refills | Status: DC | PRN

## 2021-11-21 ENCOUNTER — Telehealth: Payer: Self-pay | Admitting: Orthopaedic Surgery

## 2021-11-22 MED ORDER — HYDROCODONE-ACETAMINOPHEN 7.5-325 MG PO TABS: 1.0000 | ORAL_TABLET | Freq: Four times a day (QID) | ORAL | 0 refills | Status: DC | PRN

## 2021-11-27 ENCOUNTER — Other Ambulatory Visit: Payer: Self-pay

## 2021-11-27 ENCOUNTER — Encounter (HOSPITAL_COMMUNITY): Payer: Self-pay | Admitting: *Deleted

## 2021-11-27 ENCOUNTER — Inpatient Hospital Stay (HOSPITAL_COMMUNITY)
Admission: EM | Admit: 2021-11-27 | Discharge: 2021-11-30 | DRG: 378 | Disposition: A | Discharge status: Home / Self Care | Payer: BC Managed Care – PPO | Attending: Family Medicine | Admitting: Family Medicine

## 2021-11-27 DIAGNOSIS — R110 Nausea: Secondary | ICD-10-CM | POA: Diagnosis not present

## 2021-11-27 DIAGNOSIS — I4891 Unspecified atrial fibrillation: Secondary | ICD-10-CM

## 2021-11-27 DIAGNOSIS — M545 Low back pain, unspecified: Secondary | ICD-10-CM | POA: Diagnosis present

## 2021-11-27 DIAGNOSIS — Z6823 Body mass index (BMI) 23.0-23.9, adult: Secondary | ICD-10-CM

## 2021-11-27 DIAGNOSIS — Z880 Allergy status to penicillin: Secondary | ICD-10-CM

## 2021-11-27 DIAGNOSIS — K259 Gastric ulcer, unspecified as acute or chronic, without hemorrhage or perforation: Secondary | ICD-10-CM | POA: Diagnosis not present

## 2021-11-27 DIAGNOSIS — K3189 Other diseases of stomach and duodenum: Secondary | ICD-10-CM | POA: Diagnosis not present

## 2021-11-27 DIAGNOSIS — Z96653 Presence of artificial knee joint, bilateral: Secondary | ICD-10-CM | POA: Diagnosis present

## 2021-11-27 DIAGNOSIS — D500 Iron deficiency anemia secondary to blood loss (chronic): Secondary | ICD-10-CM | POA: Diagnosis not present

## 2021-11-27 DIAGNOSIS — Z7952 Long term (current) use of systemic steroids: Secondary | ICD-10-CM | POA: Diagnosis not present

## 2021-11-27 DIAGNOSIS — I10 Essential (primary) hypertension: Secondary | ICD-10-CM | POA: Diagnosis not present

## 2021-11-27 DIAGNOSIS — I959 Hypotension, unspecified: Secondary | ICD-10-CM | POA: Diagnosis present

## 2021-11-27 DIAGNOSIS — Z79899 Other long term (current) drug therapy: Secondary | ICD-10-CM

## 2021-11-27 DIAGNOSIS — E8809 Other disorders of plasma-protein metabolism, not elsewhere classified: Secondary | ICD-10-CM | POA: Diagnosis not present

## 2021-11-27 DIAGNOSIS — I480 Paroxysmal atrial fibrillation: Secondary | ICD-10-CM | POA: Diagnosis present

## 2021-11-27 DIAGNOSIS — K311 Adult hypertrophic pyloric stenosis: Secondary | ICD-10-CM | POA: Diagnosis not present

## 2021-11-27 DIAGNOSIS — R1013 Epigastric pain: Secondary | ICD-10-CM | POA: Diagnosis not present

## 2021-11-27 DIAGNOSIS — K2970 Gastritis, unspecified, without bleeding: Secondary | ICD-10-CM | POA: Diagnosis present

## 2021-11-27 DIAGNOSIS — Z87891 Personal history of nicotine dependence: Secondary | ICD-10-CM | POA: Diagnosis not present

## 2021-11-27 DIAGNOSIS — K254 Chronic or unspecified gastric ulcer with hemorrhage: Principal | ICD-10-CM | POA: Diagnosis present

## 2021-11-27 DIAGNOSIS — R634 Abnormal weight loss: Secondary | ICD-10-CM | POA: Diagnosis not present

## 2021-11-27 DIAGNOSIS — K31A19 Gastric intestinal metaplasia without dysplasia, unspecified site: Secondary | ICD-10-CM | POA: Diagnosis not present

## 2021-11-27 DIAGNOSIS — M069 Rheumatoid arthritis, unspecified: Secondary | ICD-10-CM | POA: Diagnosis present

## 2021-11-27 DIAGNOSIS — D649 Anemia, unspecified: Secondary | ICD-10-CM | POA: Diagnosis present

## 2021-11-27 DIAGNOSIS — D509 Iron deficiency anemia, unspecified: Secondary | ICD-10-CM | POA: Diagnosis not present

## 2021-11-27 DIAGNOSIS — G8929 Other chronic pain: Secondary | ICD-10-CM | POA: Diagnosis not present

## 2021-11-27 DIAGNOSIS — R079 Chest pain, unspecified: Secondary | ICD-10-CM | POA: Diagnosis present

## 2021-11-27 DIAGNOSIS — K2950 Unspecified chronic gastritis without bleeding: Secondary | ICD-10-CM | POA: Diagnosis not present

## 2021-11-27 DIAGNOSIS — Z791 Long term (current) use of non-steroidal anti-inflammatories (NSAID): Secondary | ICD-10-CM

## 2021-11-27 DIAGNOSIS — M1990 Unspecified osteoarthritis, unspecified site: Secondary | ICD-10-CM | POA: Diagnosis present

## 2021-11-27 DIAGNOSIS — D6489 Other specified anemias: Secondary | ICD-10-CM | POA: Diagnosis not present

## 2021-11-27 LAB — CBC WITH DIFFERENTIAL/PLATELET
Abs Immature Granulocytes: 0.02 10*3/uL (ref 0.00–0.07)
Basophils Absolute: 0.1 10*3/uL (ref 0.0–0.1)
Basophils Relative: 1 %
Eosinophils Absolute: 0.1 10*3/uL (ref 0.0–0.5)
Eosinophils Relative: 2 %
HCT: 27.2 % — ABNORMAL LOW (ref 36.0–46.0)
Hemoglobin: 7.4 g/dL — ABNORMAL LOW (ref 12.0–15.0)
Immature Granulocytes: 0 %
Lymphocytes Relative: 27 %
Lymphs Abs: 1.8 10*3/uL (ref 0.7–4.0)
MCH: 23.4 pg — ABNORMAL LOW (ref 26.0–34.0)
MCHC: 27.2 g/dL — ABNORMAL LOW (ref 30.0–36.0)
MCV: 86.1 fL (ref 80.0–100.0)
Monocytes Absolute: 0.6 10*3/uL (ref 0.1–1.0)
Monocytes Relative: 8 %
Neutro Abs: 4.2 10*3/uL (ref 1.7–7.7)
Neutrophils Relative %: 62 %
Platelets: 373 10*3/uL (ref 150–400)
RBC: 3.16 MIL/uL — ABNORMAL LOW (ref 3.87–5.11)
RDW: 17.5 % — ABNORMAL HIGH (ref 11.5–15.5)
WBC: 6.8 10*3/uL (ref 4.0–10.5)
nRBC: 0 % (ref 0.0–0.2)

## 2021-11-27 LAB — COMPREHENSIVE METABOLIC PANEL
ALT: 20 U/L (ref 0–44)
AST: 23 U/L (ref 15–41)
Albumin: 3 g/dL — ABNORMAL LOW (ref 3.5–5.0)
Alkaline Phosphatase: 123 U/L (ref 38–126)
Anion gap: 8 (ref 5–15)
BUN: 23 mg/dL — ABNORMAL HIGH (ref 6–20)
CO2: 22 mmol/L (ref 22–32)
Calcium: 8.9 mg/dL (ref 8.9–10.3)
Chloride: 107 mmol/L (ref 98–111)
Creatinine, Ser: 0.98 mg/dL (ref 0.44–1.00)
GFR, Estimated: >60 mL/min (ref >60)
Glucose, Bld: 95 mg/dL (ref 70–99)
Potassium: 4 mmol/L (ref 3.5–5.1)
Sodium: 137 mmol/L (ref 135–145)
Total Bilirubin: 0.4 mg/dL (ref 0.3–1.2)
Total Protein: 6.9 g/dL (ref 6.5–8.1)

## 2021-11-27 LAB — I-STAT CHEM 8, ED
BUN: 25 mg/dL — ABNORMAL HIGH (ref 6–20)
Calcium, Ion: 1.23 mmol/L (ref 1.15–1.40)
Chloride: 106 mmol/L (ref 98–111)
Creatinine, Ser: 1.1 mg/dL — ABNORMAL HIGH (ref 0.44–1.00)
Glucose, Bld: 90 mg/dL (ref 70–99)
HCT: 27 % — ABNORMAL LOW (ref 36.0–46.0)
Hemoglobin: 9.2 g/dL — ABNORMAL LOW (ref 12.0–15.0)
Potassium: 4.4 mmol/L (ref 3.5–5.1)
Sodium: 140 mmol/L (ref 135–145)
TCO2: 24 mmol/L (ref 22–32)

## 2021-11-27 MED ORDER — APIXABAN (ELIQUIS) EDUCATION KIT FOR DVT/PE PATIENTS
PACK | Freq: Once | Status: AC
Start: 2021-11-27 — End: 2021-11-28

## 2021-11-27 MED ORDER — LACTATED RINGERS IV SOLN: INTRAVENOUS | Status: DC

## 2021-11-27 MED ORDER — ETOMIDATE 2 MG/ML IV SOLN
INTRAVENOUS | Status: AC | PRN
  Administered 2021-11-27: 5 mg via INTRAVENOUS

## 2021-11-27 MED ORDER — APIXABAN 5 MG PO TABS
5.0000 mg | ORAL_TABLET | Freq: Two times a day (BID) | ORAL | Status: DC
  Administered 2021-11-28: 5 mg via ORAL
  Filled 2021-11-27: qty 1

## 2021-11-27 MED ORDER — ETOMIDATE 2 MG/ML IV SOLN
INTRAVENOUS | Status: AC
  Filled 2021-11-27: qty 10

## 2021-11-27 MED ORDER — LACTATED RINGERS IV BOLUS
1000.0000 mL | Freq: Once | INTRAVENOUS | Status: AC
  Administered 2021-11-27: 1000 mL via INTRAVENOUS

## 2021-11-27 NOTE — Sedation Documentation (Addendum)
Pt shocked 200 j  @ 2243 ?NSR on monitor  ?

## 2021-11-27 NOTE — H&P (Addendum)
?History and Physical  ? ? ?Patient: [name] [id number] DOB: 06/22/1971 ?DOA: 11/27/2021 ?DOS: the patient was seen and examined on 11/28/2021 ?PCP: Pcp, No  ?Patient coming from: Home ? ?Chief Complaint:  ?Chief Complaint  ?Patient presents with  ? Chest Pain  ? ?HPI: [name] is a 51 y.o. female with medical history significant for arthritis, chronic back pain who presents to the emergency department accompanied by son due to palpitations and chest discomfort which started this evening.  Patient works third shift, so when she woke up around 6 PM, she felt palpitations on left side of her chest, on getting up from bed, she felt dizzy, so she laid back in bed and called her son who brought her to the ED for further evaluation and management.  Patient states that she has never had such palpitations before.  She denies nausea, vomiting, shortness of breath, fever, chills, abdominal pain. Patient states that she has had decreased oral intake over the last few days. ? ?ED Course:  ?In the emergency department, patient was reported to be hypotensive in the 70s and HR between 140 and 170.  Patient was cardioverted to normal sinus rhythm with HR improving to 70s per minute, BP 99/79 and other vital signs being within normal range.  Work-up in the ED showed normocytic anemia and normal BMP except for slight elevation in BUN at 23, albumin 3.0 ?Patient was started on Eliquis, IV hydration was provided.  Hospitalist was asked to admit patient for further evaluation and management. ? ?Review of Systems: ?Review of systems as noted in the HPI. All other systems reviewed and are negative. ? ? ?Past Medical History:  ?Diagnosis Date  ? Arthritis   ? Chronic back pain   ? ?Past Surgical History:  ?Procedure Laterality Date  ? CHOLECYSTECTOMY    ? REPLACEMENT TOTAL KNEE BILATERAL    ? TUBAL LIGATION    ? ? ?Social History:  reports that she has quit smoking. Her smoking use included cigarettes. She has a 10.00  pack-year smoking history. She has never used smokeless tobacco. She reports that she does not drink alcohol and does not use drugs. ? ? ?Allergies  ?Allergen Reactions  ? Penicillins Nausea And Vomiting  ? ? ?Family History  ?Family history unknown: Yes  ?  ? ?Prior to Admission medications   ?Medication Sig Start Date End Date Taking? Authorizing Provider  ?azithromycin (ZITHROMAX) 250 MG tablet Take 1 tablet (250 mg total) by mouth daily. Take first 2 tablets together, then 1 every day until finished. 09/20/21   Orpah Greek, MD  ?cefdinir (OMNICEF) 300 MG capsule Take 1 capsule (300 mg total) by mouth 2 (two) times daily. 09/20/21   Orpah Greek, MD  ?diclofenac (VOLTAREN) 75 MG EC tablet TAKE (1) TABLET TWICE A DAY. 10/01/21   Sanjuana Kava, MD  ?HYDROcodone-acetaminophen (NORCO) 7.5-325 MG tablet Take 1 tablet by mouth every 6 (six) hours as needed for moderate pain (Must last 30 days). 11/22/21   Sanjuana Kava, MD  ?methocarbamol (ROBAXIN) 500 MG tablet Take 1 tablet (500 mg total) by mouth 3 (three) times daily. 10/24/21   Triplett, Tammy, PA-C  ?predniSONE (DELTASONE) 20 MG tablet Take 2 tablets (40 mg total) by mouth daily. 10/24/21   Triplett, Tammy, PA-C  ?triamcinolone cream (KENALOG) 0.1 % Apply small amount to the affected areas of both hands twice daily 10/24/21   Kem Parkinson, PA-C  ? ? ?Physical Exam: ?BP 105/70   Pulse 74  Temp 98.1 ?F (36.7 ?C)   Resp 14   Ht 5\' 7"  (1.702 m)   Wt 68 kg   LMP 09/16/2014 Comment: NEG U PREG 1/312/16  SpO2 100%   BMI 23.49 kg/m?  ? ?General: 51 y.o. year-old female well developed well nourished in no acute distress.  Alert and oriented x3. ?HEENT: NCAT, EOMI ?Neck: Supple, trachea medial ?Cardiovascular: Regular rate and rhythm with no rubs or gallops.  No thyromegaly or JVD noted.  No lower extremity edema. 2/4 pulses in all 4 extremities. ?Respiratory: Clear to auscultation with no wheezes or rales. Good inspiratory effort. ?Abdomen:  Soft, nontender nondistended with normal bowel sounds x4 quadrants. ?Muskuloskeletal: No cyanosis, clubbing or edema noted bilaterally ?Neuro: CN II-XII intact, strength 5/5 x 4, sensation, reflexes intact ?Skin: No ulcerative lesions noted or rashes ?Psychiatry: Judgement and insight appear normal. Mood is appropriate for condition and setting ?   ?   ?   ?Labs on Admission:  ?Basic Metabolic Panel: ?Recent Labs  ?Lab 11/27/21 ?2140 11/27/21 ?2145  ?NA 137 140  ?K 4.0 4.4  ?CL 107 106  ?CO2 22  --   ?GLUCOSE 95 90  ?BUN 23* 25*  ?CREATININE 0.98 1.10*  ?CALCIUM 8.9  --   ? ?Liver Function Tests: ?Recent Labs  ?Lab 11/27/21 ?2140  ?AST 23  ?ALT 20  ?ALKPHOS 123  ?BILITOT 0.4  ?PROT 6.9  ?ALBUMIN 3.0*  ? ?No results for input(s): LIPASE, AMYLASE in the last 168 hours. ?No results for input(s): AMMONIA in the last 168 hours. ?CBC: ?Recent Labs  ?Lab 11/27/21 ?2140 11/27/21 ?2145  ?WBC 6.8  --   ?NEUTROABS 4.2  --   ?HGB 7.4* 9.2*  ?HCT 27.2* 27.0*  ?MCV 86.1  --   ?PLT 373  --   ? ?Cardiac Enzymes: ?No results for input(s): CKTOTAL, CKMB, CKMBINDEX, TROPONINI in the last 168 hours. ? ?BNP (last 3 results) ?No results for input(s): BNP in the last 8760 hours. ? ?ProBNP (last 3 results) ?No results for input(s): PROBNP in the last 8760 hours. ? ?CBG: ?No results for input(s): GLUCAP in the last 168 hours. ? ?Radiological Exams on Admission: ?No results found. ? ?EKG: I independently viewed the EKG done and my findings are as followed:  ?Initial EKG personally reviewed showed A-fib with RVR. ?Subsequent EKG after cardioversion showed normal sinus rhythm at a rate of 76 bpm ? ?Assessment/Plan ?Present on Admission: ? Paroxysmal atrial fibrillation with RVR ([place]) ? Midline low back pain without sciatica ? ?Principal Problem: ?  Paroxysmal atrial fibrillation with RVR ([place]) ?Active Problems: ?  Midline low back pain without sciatica ?  Arthritis ?  Normocytic anemia ?  Hypoalbuminemia ? ?Paroxysmal atrial fibrillation with  RVR-resolved ?Patient is currently back to normal sinus rhythm status post cardioversion ?Continue telemetry ?Patient was started on Eliquis ?Cardiology will be consulted to see patient in the morning and we shall await further recommendation ? ?Normocytic anemia ?H/H 7.4/27.2, this was 7.6/26.7 on 09/20/2021 ?There is possibly a component of iron deficiency anemia considering the elevated RDW ?Iron studies will be checked and patient will be managed accordingly ? ?Hypoalbuminemia possibly secondary to mild protein calorie malnutrition ?Albumin 3.0, protein supplement to be provided ? ?Rheumatoid arthritis/chronic back pain ?Continue home Norco and Robaxin ? ? ?DVT prophylaxis: Eliquis ? ? Advance Care Planning: CODE STATUS: Full code ? ?Consults: Cardiology ? ?Family Communication: Son at bedside (all questions answered to satisfaction) ? ?Severity of Illness: ?The appropriate patient status for this  patient is OBSERVATION. Observation status is judged to be reasonable and necessary in order to provide the required intensity of service to ensure the patient's safety. The patient's presenting symptoms, physical exam findings, and initial radiographic and laboratory data in the context of their medical condition is felt to place them at decreased risk for further clinical deterioration. Furthermore, it is anticipated that the patient will be medically stable for discharge from the hospital within 2 midnights of admission.  ? ?Author: Bernadette Hoit, DO ?11/28/2021 12:47 AM ? ?For on call review [email or web address].  ?

## 2021-11-27 NOTE — ED Triage Notes (Signed)
Pt with left sided CP that is "pounding" per pt that started today, c/o dizziness with standing as well.  + SOB at rest , worse with exertion.  ?

## 2021-11-27 NOTE — Sedation Documentation (Signed)
Family updated as to patient's status.

## 2021-11-27 NOTE — ED Provider Notes (Signed)
?[place] ?Provider Note ? ?CSN: [id number] ?Arrival date & time: 11/27/21 2118 ? ?Chief Complaint(s) ?Chest Pain ? ?HPI ?[name] is a 51 y.o. female with PMH anemia, arthritis, chronic back pain who presents emergency department for evaluation of chest pain and palpitations.  Patient states that she works the third shift and awoke approximately 3 hours prior to arrival with worsening palpitations and chest pain.  She was found to be in A-fib with RVR brought to the emergency department for evaluation.  Patient arrives hypotensive in the 70s with heart rates fluttering between 140 and 170.  She states she has never felt this palpitations before.  Currently not on a blood thinner.  Denies shortness of breath, abdominal pain, nausea, vomiting or other systemic symptoms.  She states that she has been taking decreased p.o. intake over the last few days. ? ? ?Chest Pain ?Associated symptoms: palpitations   ? ?Past Medical History ?Past Medical History:  ?Diagnosis Date  ? Arthritis   ? Chronic back pain   ? ?Patient Active Problem List  ? Diagnosis Date Noted  ? Paroxysmal atrial fibrillation with RVR ([place]) 11/27/2021  ? Midline low back pain without sciatica 11/02/2015  ? ?Home Medication(s) ?Prior to Admission medications   ?Medication Sig Start Date End Date Taking? Authorizing Provider  ?azithromycin (ZITHROMAX) 250 MG tablet Take 1 tablet (250 mg total) by mouth daily. Take first 2 tablets together, then 1 every day until finished. 09/20/21   Orpah Greek, MD  ?cefdinir (OMNICEF) 300 MG capsule Take 1 capsule (300 mg total) by mouth 2 (two) times daily. 09/20/21   Orpah Greek, MD  ?diclofenac (VOLTAREN) 75 MG EC tablet TAKE (1) TABLET TWICE A DAY. 10/01/21   Sanjuana Kava, MD  ?HYDROcodone-acetaminophen (NORCO) 7.5-325 MG tablet Take 1 tablet by mouth every 6 (six) hours as needed for moderate pain (Must last 30 days). 11/22/21   Sanjuana Kava, MD  ?methocarbamol  (ROBAXIN) 500 MG tablet Take 1 tablet (500 mg total) by mouth 3 (three) times daily. 10/24/21   Triplett, Tammy, PA-C  ?predniSONE (DELTASONE) 20 MG tablet Take 2 tablets (40 mg total) by mouth daily. 10/24/21   Triplett, Tammy, PA-C  ?triamcinolone cream (KENALOG) 0.1 % Apply small amount to the affected areas of both hands twice daily 10/24/21   Kem Parkinson, PA-C  ?                                                                                                                                  ?Past Surgical History ?Past Surgical History:  ?Procedure Laterality Date  ? CHOLECYSTECTOMY    ? REPLACEMENT TOTAL KNEE BILATERAL    ? TUBAL LIGATION    ? ?Family History ?Family History  ?Family history unknown: Yes  ? ? ?Social History ?Social History  ? ?Tobacco Use  ? Smoking status: Former  ?  Packs/day: 0.50  ?  Years: 20.00  ?  Pack years: 10.00  ?  Types: Cigarettes  ? Smokeless tobacco: Never  ?Substance Use Topics  ? Alcohol use: No  ? Drug use: No  ? ?Allergies ?Penicillins ? ?Review of Systems ?Review of Systems  ?Cardiovascular:  Positive for chest pain and palpitations.  ? ?Physical Exam ?Vital Signs  ?I have reviewed the triage vital signs ?BP 99/79   Pulse 71   Temp 98.1 ?F (36.7 ?C)   Resp 19   Ht _0  (1.702 m)   Wt 68 kg   LMP 09/16/2014 Comment: NEG U PREG 1/312/16  SpO2 100%   BMI 23.49 kg/m?  ? ?Physical Exam ?Vitals and nursing note reviewed.  ?Constitutional:   ?   General: She is not in acute distress. ?   Appearance: She is well-developed.  ?HENT:  ?   Head: Normocephalic and atraumatic.  ?Eyes:  ?   Conjunctiva/sclera: Conjunctivae normal.  ?Cardiovascular:  ?   Rate and Rhythm: Tachycardia present. Rhythm irregular.  ?   Heart sounds: No murmur heard. ?Pulmonary:  ?   Effort: Pulmonary effort is normal. No respiratory distress.  ?   Breath sounds: Normal breath sounds.  ?Abdominal:  ?   Palpations: Abdomen is soft.  ?   Tenderness: There is no abdominal tenderness.  ?Musculoskeletal:      ?   General: No swelling.  ?   Cervical back: Neck supple.  ?Skin: ?   General: Skin is warm and dry.  ?   Capillary Refill: Capillary refill takes less than 2 seconds.  ?Neurological:  ?   Mental Status: She is alert.  ?Psychiatric:     ?   Mood and Affect: Mood normal.  ? ? ?ED Results and Treatments ?Labs ?(all labs ordered are listed, but only abnormal results are displayed) ?Labs Reviewed  ?COMPREHENSIVE METABOLIC PANEL - Abnormal; Notable for the following components:  ?    Result Value  ? BUN 23 (*)   ? Albumin 3.0 (*)   ? All other components within normal limits  ?CBC WITH DIFFERENTIAL/PLATELET - Abnormal; Notable for the following components:  ? RBC 3.16 (*)   ? Hemoglobin 7.4 (*)   ? HCT 27.2 (*)   ? MCH 23.4 (*)   ? MCHC 27.2 (*)   ? RDW 17.5 (*)   ? All other components within normal limits  ?I-STAT CHEM 8, ED - Abnormal; Notable for the following components:  ? BUN 25 (*)   ? Creatinine, Ser 1.10 (*)   ? Hemoglobin 9.2 (*)   ? HCT 27.0 (*)   ? All other components within normal limits  ?                                                                                                                       ? ?Radiology ?No results found. ? ?Pertinent labs & imaging results that were available during my care of the patient were reviewed by me and considered in my medical decision making (  see MDM for details). ? ?Medications Ordered in ED ?Medications  ?apixaban [facility]) Education Kit for DVT/PE patients (has no administration in time range)  ?lactated ringers infusion ( Intravenous New Bag/Given 11/27/21 2316)  ?apixaban (ELIQUIS) tablet 5 mg (has no administration in time range)  ?lactated ringers bolus 1,000 mL (0 mLs Intravenous Stopped 11/27/21 2213)  ?etomidate (AMIDATE) injection (5 mg Intravenous Given 11/27/21 2242)  ?                                                               ?                                                                    ?Procedures ?.Cardioversion ? ?Date/Time: 11/28/2021  12:04 AM ?Performed by: Teressa Lower, MD ?Authorized by: Teressa Lower, MD  ? ?Consent:  ?  Consent obtained:  Verbal ?  Consent given by:  Patient ?  Risks discussed:  Cutaneous burn, death, pain and induced arrhythmia ?  Alternatives discussed:  Rate-control medication ?Pre-procedure details:  ?  Cardioversion basis:  Elective ?  Rhythm:  Atrial flutter ?  Electrode placement:  Anterior-posterior ?Patient sedated: Yes. Refer to sedation procedure documentation for details of sedation. ? ?Attempt one:  ?  Cardioversion mode:  Synchronous ?  Shock (Joules):  200 ?  Shock outcome:  Conversion to normal sinus rhythm ?Post-procedure details:  ?  Patient status:  Awake ?  Patient tolerance of procedure:  Tolerated well, no immediate complications ?.Sedation ? ?Date/Time: 11/28/2021 12:05 AM ?Performed by: Teressa Lower, MD ?Authorized by: Teressa Lower, MD  ? ?Consent:  ?  Consent obtained:  Verbal ?  Consent given by:  Patient ?  Risks discussed:  Allergic reaction, dysrhythmia, inadequate sedation, nausea, prolonged hypoxia resulting in organ damage, prolonged sedation necessitating reversal, respiratory compromise necessitating ventilatory assistance and intubation and vomiting ?  Alternatives discussed:  Analgesia without sedation, anxiolysis and regional anesthesia ?Universal protocol:  ?  Procedure explained and questions answered to patient or proxy's satisfaction: yes   ?  Relevant documents present and verified: yes   ?  Test results available: yes   ?  Imaging studies available: yes   ?  Required blood products, implants, devices, and special equipment available: yes   ?  Site/side marked: yes   ?  Immediately prior to procedure, a time out was called: yes   ?  Patient identity confirmed:  Verbally with patient ?Indications:  ?  Procedure necessitating sedation performed by:  Physician performing sedation ?Pre-sedation assessment:  ?  Time since last food or drink:  0800 ?  ASA classification: class 1  - normal, healthy patient   ?  Mouth opening:  3 or more finger widths ?  Thyromental distance:  4 finger widths ?  Mallampati score:  I - soft palate, uvula, fauces, pillars visible ?  Neck mobility: normal

## 2021-11-27 NOTE — Progress Notes (Signed)
Assisted with Cardioversion on patient.  EtCO2 in place and patient stayed within normal limits throughout procedure.  Patient back in NSR, currently on 2L for support with sat of 100%.  Will continue to monitor. ?

## 2021-11-28 ENCOUNTER — Observation Stay (HOSPITAL_BASED_OUTPATIENT_CLINIC_OR_DEPARTMENT_OTHER): Payer: BC Managed Care – PPO

## 2021-11-28 ENCOUNTER — Encounter (HOSPITAL_COMMUNITY): Payer: Self-pay | Admitting: Internal Medicine

## 2021-11-28 DIAGNOSIS — M545 Low back pain, unspecified: Secondary | ICD-10-CM

## 2021-11-28 DIAGNOSIS — D6489 Other specified anemias: Secondary | ICD-10-CM | POA: Diagnosis not present

## 2021-11-28 DIAGNOSIS — I480 Paroxysmal atrial fibrillation: Secondary | ICD-10-CM | POA: Diagnosis not present

## 2021-11-28 DIAGNOSIS — D509 Iron deficiency anemia, unspecified: Secondary | ICD-10-CM

## 2021-11-28 DIAGNOSIS — M1990 Unspecified osteoarthritis, unspecified site: Secondary | ICD-10-CM | POA: Diagnosis present

## 2021-11-28 DIAGNOSIS — R634 Abnormal weight loss: Secondary | ICD-10-CM | POA: Diagnosis present

## 2021-11-28 DIAGNOSIS — I4891 Unspecified atrial fibrillation: Secondary | ICD-10-CM

## 2021-11-28 DIAGNOSIS — R110 Nausea: Secondary | ICD-10-CM | POA: Diagnosis not present

## 2021-11-28 DIAGNOSIS — E8809 Other disorders of plasma-protein metabolism, not elsewhere classified: Secondary | ICD-10-CM | POA: Diagnosis present

## 2021-11-28 DIAGNOSIS — Z791 Long term (current) use of non-steroidal anti-inflammatories (NSAID): Secondary | ICD-10-CM | POA: Diagnosis not present

## 2021-11-28 DIAGNOSIS — D649 Anemia, unspecified: Secondary | ICD-10-CM | POA: Diagnosis present

## 2021-11-28 DIAGNOSIS — G8929 Other chronic pain: Secondary | ICD-10-CM

## 2021-11-28 LAB — IRON AND TIBC
Iron: 13 ug/dL — ABNORMAL LOW (ref 28–170)
Saturation Ratios: 5 % — ABNORMAL LOW (ref 10.4–31.8)
TIBC: 285 ug/dL (ref 250–450)
UIBC: 272 ug/dL

## 2021-11-28 LAB — CBC
HCT: 19.8 % — ABNORMAL LOW (ref 36.0–46.0)
Hemoglobin: 5.5 g/dL — CL (ref 12.0–15.0)
MCH: 23.7 pg — ABNORMAL LOW (ref 26.0–34.0)
MCHC: 27.8 g/dL — ABNORMAL LOW (ref 30.0–36.0)
MCV: 85.3 fL (ref 80.0–100.0)
Platelets: 289 10*3/uL (ref 150–400)
RBC: 2.32 MIL/uL — ABNORMAL LOW (ref 3.87–5.11)
RDW: 17.5 % — ABNORMAL HIGH (ref 11.5–15.5)
WBC: 5.3 10*3/uL (ref 4.0–10.5)
nRBC: 0 % (ref 0.0–0.2)

## 2021-11-28 LAB — ECHOCARDIOGRAM COMPLETE
AR max vel: 3.4 cm2
AV Area VTI: 3.28 cm2
AV Area mean vel: 3.14 cm2
AV Mean grad: 2 mmHg
AV Peak grad: 4.5 mmHg
Ao pk vel: 1.06 m/s
Area-P 1/2: 3.58 cm2
Calc EF: 60.7 %
Height: 67 in
MV VTI: 2.74 cm2
S' Lateral: 3.6 cm
Single Plane A2C EF: 65.4 %
Single Plane A4C EF: 60.4 %
Weight: 2400 oz

## 2021-11-28 LAB — PREPARE RBC (CROSSMATCH)

## 2021-11-28 LAB — COMPREHENSIVE METABOLIC PANEL
ALT: 15 U/L (ref 0–44)
AST: 16 U/L (ref 15–41)
Albumin: 2.5 g/dL — ABNORMAL LOW (ref 3.5–5.0)
Alkaline Phosphatase: 93 U/L (ref 38–126)
Anion gap: 1 — ABNORMAL LOW (ref 5–15)
BUN: 16 mg/dL (ref 6–20)
CO2: 23 mmol/L (ref 22–32)
Calcium: 8.1 mg/dL — ABNORMAL LOW (ref 8.9–10.3)
Chloride: 115 mmol/L — ABNORMAL HIGH (ref 98–111)
Creatinine, Ser: 0.73 mg/dL (ref 0.44–1.00)
GFR, Estimated: >60 mL/min (ref >60)
Glucose, Bld: 79 mg/dL (ref 70–99)
Potassium: 3.9 mmol/L (ref 3.5–5.1)
Sodium: 139 mmol/L (ref 135–145)
Total Bilirubin: 0.1 mg/dL — ABNORMAL LOW (ref 0.3–1.2)
Total Protein: 5.6 g/dL — ABNORMAL LOW (ref 6.5–8.1)

## 2021-11-28 LAB — MAGNESIUM: Magnesium: 1.6 mg/dL — ABNORMAL LOW (ref 1.7–2.4)

## 2021-11-28 LAB — PHOSPHORUS: Phosphorus: 2.8 mg/dL (ref 2.5–4.6)

## 2021-11-28 LAB — FERRITIN: Ferritin: 8 ng/mL — ABNORMAL LOW (ref 11–307)

## 2021-11-28 LAB — HEMOGLOBIN AND HEMATOCRIT, BLOOD
HCT: 20.8 % — ABNORMAL LOW (ref 36.0–46.0)
Hemoglobin: 5.9 g/dL — CL (ref 12.0–15.0)

## 2021-11-28 LAB — HIV ANTIBODY (ROUTINE TESTING W REFLEX): HIV Screen 4th Generation wRfx: NONREACTIVE

## 2021-11-28 MED ORDER — PANTOPRAZOLE SODIUM 40 MG IV SOLR
40.0000 mg | Freq: Two times a day (BID) | INTRAVENOUS | Status: DC
  Administered 2021-11-28 – 2021-11-29 (×2): 40 mg via INTRAVENOUS
  Filled 2021-11-28 (×2): qty 10

## 2021-11-28 MED ORDER — ACETAMINOPHEN 325 MG PO TABS
650.0000 mg | ORAL_TABLET | Freq: Four times a day (QID) | ORAL | Status: DC | PRN
Start: 2021-11-28 — End: 2021-11-30
  Administered 2021-11-28: 650 mg via ORAL
  Filled 2021-11-28: qty 2

## 2021-11-28 MED ORDER — ALBUTEROL SULFATE (2.5 MG/3ML) 0.083% IN NEBU: 3.0000 mL | INHALATION_SOLUTION | Freq: Four times a day (QID) | RESPIRATORY_TRACT | Status: DC | PRN

## 2021-11-28 MED ORDER — ENSURE ENLIVE PO LIQD
237.0000 mL | Freq: Two times a day (BID) | ORAL | Status: DC
  Administered 2021-11-30 (×2): 237 mL via ORAL
  Filled 2021-11-28 (×2): qty 237

## 2021-11-28 MED ORDER — DIPHENHYDRAMINE HCL 25 MG PO CAPS: 50.0000 mg | ORAL_CAPSULE | Freq: Four times a day (QID) | ORAL | Status: DC | PRN

## 2021-11-28 MED ORDER — DICLOFENAC SODIUM 1 % EX GEL
2.0000 g | Freq: Four times a day (QID) | CUTANEOUS | Status: DC | PRN
  Filled 2021-11-28: qty 100

## 2021-11-28 MED ORDER — MAGNESIUM SULFATE 4 GM/100ML IV SOLN
4.0000 g | Freq: Once | INTRAVENOUS | Status: AC
  Administered 2021-11-28: 4 g via INTRAVENOUS
  Filled 2021-11-28: qty 100

## 2021-11-28 MED ORDER — HYDROCODONE-ACETAMINOPHEN 7.5-325 MG PO TABS
1.0000 | ORAL_TABLET | Freq: Four times a day (QID) | ORAL | Status: DC | PRN
  Administered 2021-11-28 – 2021-11-30 (×5): 1 via ORAL
  Filled 2021-11-28 (×5): qty 1

## 2021-11-28 MED ORDER — SODIUM CHLORIDE 0.9% IV SOLUTION: Freq: Once | INTRAVENOUS | Status: AC

## 2021-11-28 MED ORDER — METHOCARBAMOL 500 MG PO TABS
500.0000 mg | ORAL_TABLET | Freq: Three times a day (TID) | ORAL | Status: DC
  Administered 2021-11-28 – 2021-11-30 (×3): 500 mg via ORAL
  Filled 2021-11-28 (×4): qty 1

## 2021-11-28 MED ORDER — ACETAMINOPHEN 325 MG PO TABS
650.0000 mg | ORAL_TABLET | Freq: Four times a day (QID) | ORAL | Status: DC
  Administered 2021-11-28 – 2021-11-30 (×5): 650 mg via ORAL
  Filled 2021-11-28 (×5): qty 2

## 2021-11-28 NOTE — Assessment & Plan Note (Addendum)
--   Chronic blood loss from upper GI bleeding related to chronic NSAID usage and subsequent extensive gastric ulceration seen on EGD.  ?--protonix 40 mg BID ordered by GI for 12 weeks.  STOP systemic NSAIDS.  ?--repeat EGD and colonoscopy in 12 weeks  ?

## 2021-11-28 NOTE — Assessment & Plan Note (Signed)
--   Explained to patient that oral NSAIDs not recommended at this time given issues with GI bleeding ?-- Topical Voltaren gel ordered and oral Tylenol ?-- Further recommendations pending GI work-up ?

## 2021-11-28 NOTE — Consult Note (Signed)
?Referring Provider: No ref. provider found ?Primary Care Physician:  Pcp, No ?Primary Gastroenterologist:  not previously established ? ?Date of Admission: 11/27/21 ?Date of Consultation: 11/28/21 ? ?Reason for Consultation:  IDA ? ?HPI:  ?[name] is a 51 y.o. year old female with medical history of anemia, arthritis, chronic back pain who presented to the ED yesterday for Chest pain and palpiatations. Patietn reported waking approx 3 hours prior to her arrival to ED with worsening palpitations and CP. Found to be in Afib with RVR. Hypotensive with SBP in 70s on arrivial with HR between 140-170bpm. Denies any hx of the same. Not previously on anticoagulation. Denied sob, abd pain, nausea or vomiting. Did report decreased PO intake over the past few days.  ? ?ED Course: ?Hypotensive in the 70s, HR initially 140-170 range, successful cardioversion for Afib with RVR with return to NSR, HR in the 70s. Started on Eliquis, IVF initiated.  ?Hgb 7.4 with MCV 86.1, Ptl 373K, BUN 23, ferritin 8, iron 13, TIBC 285, Saturation 5%, mag 1.6 ? ?Consult: ?Hgb dropped to 5.5 this morning after starting on Eliquis yesterday. H&H was repeated for verification with repeat of 5.9. PRBC transfusion x2 initiated. Eliquis now on hold. ? ?Patient reports that she has had some chest pain in the past but different from this admission. States she works 3rd shift, she got up around 1800, feeling dizzy, nausea and like her heart "was coming out of my chest." She denies any history of cardiac issues in the past. No new medications prior to admission. Reports hx of anemia in the past after a knee replacement but no other time. Denies rectal bleeding or melena. States SOB yesterday when in Afib but this has resolved. Denies constipation or diarrhea. Has 1 BM every other day. Denies any recent changes in her bowel habits. No hx of colonoscopy or upper endoscopy in the past. Patient does endorse recent nausea after eating for the past 3-4  weeks. She did have one episode of foamy like emesis. Denies any abdominal pain, bloating or early satiety. Denies any issues with dysphagia or odynophagia. Denies any issues with heartburn or acid regurgitation. She reports that she takes two aleve per day, she has been using these for the past 2-3 years. Also on diclofenac 75mg BID, has been on this for a while as well. She reports that appetite has been down due to nausea, she has lost about 30 pounds over the past 6 months. She has also been drinking only water recently. She denies any other sources of blood loss such as epistaxis or vaginal bleeding.  ? ? ?EGD: never ?Colonoscopy:never ? ?Family: no CRC or liver disease ?Social: smoke 2-3 cigarettes per day, no ETOH ? ?Past Medical History:  ?Diagnosis Date  ? Arthritis   ? Chronic back pain   ? ? ?Past Surgical History:  ?Procedure Laterality Date  ? CHOLECYSTECTOMY    ? REPLACEMENT TOTAL KNEE BILATERAL    ? TUBAL LIGATION    ? ? ?Prior to Admission medications   ?Medication Sig Start Date End Date Taking? Authorizing Provider  ?azithromycin (ZITHROMAX) 250 MG tablet Take 1 tablet (250 mg total) by mouth daily. Take first 2 tablets together, then 1 every day until finished. 09/20/21   Pollina, Christopher J, MD  ?cefdinir (OMNICEF) 300 MG capsule Take 1 capsule (300 mg total) by mouth 2 (two) times daily. 09/20/21   Pollina, Christopher J, MD  ?diclofenac (VOLTAREN) 75 MG EC tablet TAKE (1) TABLET TWICE A   DAY. 10/01/21   Keeling, Wayne, MD  ?HYDROcodone-acetaminophen (NORCO) 7.5-325 MG tablet Take 1 tablet by mouth every 6 (six) hours as needed for moderate pain (Must last 30 days). 11/22/21   Keeling, Wayne, MD  ?methocarbamol (ROBAXIN) 500 MG tablet Take 1 tablet (500 mg total) by mouth 3 (three) times daily. 10/24/21   Triplett, Tammy, PA-C  ?predniSONE (DELTASONE) 20 MG tablet Take 2 tablets (40 mg total) by mouth daily. 10/24/21   Triplett, Tammy, PA-C  ?triamcinolone cream (KENALOG) 0.1 % Apply small amount to  the affected areas of both hands twice daily 10/24/21   Triplett, Tammy, PA-C  ? ? ?Current Facility-Administered Medications  ?Medication Dose Route Frequency Provider Last Rate Last Admin  ? acetaminophen (TYLENOL) tablet 650 mg  650 mg Oral Q6H PRN Adefeso, Oladapo, DO   650 mg at 11/28/21 0932  ? feeding supplement (ENSURE ENLIVE / ENSURE PLUS) liquid 237 mL  237 mL Oral BID BM Adefeso, Oladapo, DO      ? HYDROcodone-acetaminophen (NORCO) 7.5-325 MG per tablet 1 tablet  1 tablet Oral Q6H PRN Adefeso, Oladapo, DO      ? lactated ringers infusion   Intravenous Continuous Adefeso, Oladapo, DO 125 mL/hr at 11/28/21 0752 New Bag at 11/28/21 0752  ? methocarbamol (ROBAXIN) tablet 500 mg  500 mg Oral TID Adefeso, Oladapo, DO      ? ? ?Allergies as of 11/27/2021 - Review Complete 11/27/2021  ?Allergen Reaction Noted  ? Penicillins Nausea And Vomiting 07/31/2011  ? ? ?Family History  ?Family history unknown: Yes  ? ? ?Social History  ? ?Socioeconomic History  ? Marital status: Legally Separated  ?  Spouse name: Not on file  ? Number of children: Not on file  ? Years of education: Not on file  ? Highest education level: Not on file  ?Occupational History  ? Not on file  ?Tobacco Use  ? Smoking status: Former  ?  Packs/day: 0.50  ?  Years: 20.00  ?  Pack years: 10.00  ?  Types: Cigarettes  ? Smokeless tobacco: Never  ?Substance and Sexual Activity  ? Alcohol use: No  ? Drug use: No  ? Sexual activity: Yes  ?  Birth control/protection: None, Surgical  ?Other Topics Concern  ? Not on file  ?Social History Narrative  ? Not on file  ? ?Social Determinants of Health  ? ?Financial Resource Strain: Not on file  ?Food Insecurity: Not on file  ?Transportation Needs: Not on file  ?Physical Activity: Not on file  ?Stress: Not on file  ?Social Connections: Not on file  ?Intimate Partner Violence: Not on file  ? ? ?Review of Systems: ?Gen: Denies fever, chills, loss of appetite, change in weight or weight loss ?CV: Denies chest pain,  heart palpitations, syncope, edema  ?Resp: Denies shortness of breath with rest, cough, wheezing ?GI: Denies dysphagia or odynophagia. Denies vomiting blood, jaundice, and fecal incontinence.  ?GU : Denies urinary burning, urinary frequency, urinary incontinence.  ?MS: Denies joint pain,swelling, cramping ?Derm: Denies rash, itching, dry skin ?Psych: Denies depression, anxiety,confusion, or memory loss ?Heme: Denies bruising, bleeding, and enlarged lymph nodes. ? ?Physical Exam: ?Vital signs in last 24 hours: ?Temp:  [98.1 ?F (36.7 ?C)-99.5 ?F (37.5 ?C)] 98.9 ?F (37.2 ?C) (03/29 1002) ?Pulse Rate:  [69-167] 70 (03/29 1002) ?Resp:  [14-28] 18 (03/29 1002) ?BP: (76-130)/(46-85) 130/72 (03/29 1002) ?SpO2:  [95 %-100 %] 99 % (03/29 1002) ?Weight:  [68 kg] 68 kg (03/28 2133) ?Last BM Date :   11/26/21 ?General:   Alert,  Well-developed, well-nourished, pleasant and cooperative in NAD ?Head:  Normocephalic and atraumatic. ?Eyes:  Sclera clear, no icterus.   Conjunctiva pink. ?Ears:  Normal auditory acuity. ?Nose:  No deformity, discharge,  or lesions. ?Mouth:  No deformity or lesions, dentition normal. ?Lungs:  Clear throughout to auscultation.   No wheezes, crackles, or rhonchi. No acute distress. ?Heart:  Regular rate and rhythm; no murmurs, clicks, rubs,  or gallops. ?Abdomen:  Soft, nontender and nondistended. No masses, hepatosplenomegaly or hernias noted. Normal bowel sounds, without guarding, and without rebound.   ?Rectal:  Deferred until time of colonoscopy.   ?Msk:  Symmetrical without gross deformities. Normal posture. ?Pulses:  Normal pulses noted. ?Extremities:  Without clubbing or edema. ?Neurologic:  Alert and  oriented x4;  grossly normal neurologically. ?Skin:  Intact without significant lesions or rashes. ?Psych:  Alert and cooperative. Normal mood and affect. ? ?Intake/Output from previous day: ?03/28 0701 - 03/29 0700 ?In: 584.9 [I.V.:584.9] ?Out: -  ?Intake/Output this shift: ?Total I/O ?In: 423.5  [I.V.:423.5] ?Out: -  ? ?Lab Results: ?Recent Labs  ?  11/27/21 ?2140 11/27/21 ?2145 11/28/21 ?0525 11/28/21 ?0658  ?WBC 6.8  --  5.3  --   ?HGB 7.4* 9.2* 5.5* 5.9*  ?HCT 27.2* 27.0* 19.8* 20.8*  ?PLT 373  --

## 2021-11-28 NOTE — Assessment & Plan Note (Signed)
--   Stable likely secondary to poor oral intake ?

## 2021-11-28 NOTE — Progress Notes (Addendum)
PROGRESS NOTE   [name]  XBJ:[id number] DOB: [birth year] DOA: 11/27/2021 PCP: Pcp, No   Chief Complaint  Patient presents with   Chest Pain   Level of care: Telemetry  Brief Admission History:   51 y.o. female with medical history significant for arthritis, chronic back pain who presents to the emergency department accompanied by son due to palpitations and chest discomfort which started this evening.  Patient works third shift, so when she woke up around 6 PM, she felt palpitations on left side of her chest, on getting up from bed, she felt dizzy, so she laid back in bed and called her son who brought her to the ED for further evaluation and management.  Patient states that she has never had such palpitations before.  She denies nausea, vomiting, shortness of breath, fever, chills, abdominal pain. Patient states that she has had decreased oral intake over the last few days.   ED Course:  In the emergency department, patient was reported to be hypotensive in the 70s and HR between 140 and 170.  Patient was cardioverted to normal sinus rhythm with HR improving to 70s per minute, BP 99/79 and other vital signs being within normal range.  Work-up in the ED showed normocytic anemia and normal BMP except for slight elevation in BUN at 23, albumin 3.0 Patient was started on Eliquis, IV hydration was provided.  Hospitalist was asked to admit patient for further evaluation and management.  11/28/2021:  GI and cardiology consultation requested.  Further recommendations to follow.  Transfuse 2 units PRBC for Hg down to 5.5.     Assessment and Plan: * Paroxysmal atrial fibrillation with RVR (HCC) -- Appreciate cardiology consultation and recommendations -- Follow-up TSH testing -- Follow-up 2D echocardiogram results -- Holding AV nodal blocking agents at this time given soft BPs per cardiology -- Holding anticoagulation until GI work-up is completed -- Given chronic NSAID use, suspect upper  GI bleeding with possible ulceration -- Further recommendations pending GI evaluation  Hypomagnesemia --IV replacement ordered.  Recheck in AM.   Hypoalbuminemia -- Stable likely secondary to poor oral intake  Severe iron deficiency anemia -- Suspect chronic blood loss from upper GI bleeding related to chronic NSAID usage -- Requested GI consultation for upper endoscopy consideration -- Hemoglobin down to 5.6, transfused 2 units PRBC -- Protonix for GI protection  Arthritis -- Holding NSAIDs likely will need to discontinue Voltaren at discharge -- Protonix for GI protection -- Scheduled acetaminophen ordered  Midline low back pain without sciatica -- Explained to patient that oral NSAIDs not recommended at this time given issues with GI bleeding -- Topical Voltaren gel ordered and oral Tylenol -- Further recommendations pending GI work-up  DVT prophylaxis: SCDs Code Status: Full  Family Communication: bedside update 3/29 Disposition: Status is: Observation The patient remains OBS appropriate and will d/c before 2 midnights.   Consultants:  GI  Procedures:  TBD pending GI  Antimicrobials:  N/a   Subjective: Pt reports back pain and asking for voltaren.   Objective: Vitals:   11/28/21 0918 11/28/21 1002 11/28/21 1215 11/28/21 1401  BP: 110/73 130/72 (!) 147/80 135/75  Pulse: 73 70 65 67  Resp: 20 18 18 18   Temp: 99.5 F (37.5 C) 98.9 F (37.2 C) 98.6 F (37 C) 98.8 F (37.1 C)  TempSrc: Oral Oral Oral Oral  SpO2: 99% 99% 100% 99%  Weight:      Height:        Intake/Output Summary (Last 24  hours) at 11/28/2021 1421 Last data filed at 11/28/2021 1410 Gross per 24 hour  Intake 1508.38 ml  Output --  Net 1508.38 ml   Filed Weights   11/27/21 2133  Weight: 68 kg   Examination:  General exam: Pt appears very pale.  Appears calm and comfortable  Respiratory system: Clear to auscultation. Respiratory effort normal. Cardiovascular system: normal S1 & S2  heard. No JVD, murmurs, rubs, gallops or clicks. No pedal edema. Gastrointestinal system: Abdomen is nondistended, soft and nontender. No organomegaly or masses felt. Normal bowel sounds heard. Central nervous system: Alert and oriented. No focal neurological deficits. Extremities: Symmetric 5 x 5 power. Skin: No rashes, lesions or ulcers. Psychiatry: Judgement and insight appear normal. Mood & affect appropriate.   Data Reviewed: I have personally reviewed following labs and imaging studies  CBC: Recent Labs  Lab 11/27/21 2140 11/27/21 2145 11/28/21 0525 11/28/21 0658  WBC 6.8  --  5.3  --   NEUTROABS 4.2  --   --   --   HGB 7.4* 9.2* 5.5* 5.9*  HCT 27.2* 27.0* 19.8* 20.8*  MCV 86.1  --  85.3  --   PLT 373  --  289  --     Basic Metabolic Panel: Recent Labs  Lab 11/27/21 2140 11/27/21 2145 11/28/21 0525  NA 137 140 139  K 4.0 4.4 3.9  CL 107 106 115*  CO2 22  --  23  GLUCOSE 95 90 79  BUN 23* 25* 16  CREATININE 0.98 1.10* 0.73  CALCIUM 8.9  --  8.1*  MG  --   --  1.6*  PHOS  --   --  2.8    CBG: No results for input(s): GLUCAP in the last 168 hours.  No results found for this or any previous visit (from the past 240 hour(s)).   Radiology Studies: ECHOCARDIOGRAM COMPLETE  Result Date: 11/28/2021    ECHOCARDIOGRAM REPORT   Patient Name:   [name] Date of Exam: 11/28/2021 Medical Rec #:  [id number]        Height:       67.0 in Accession #:    [id number]       Weight:       150.0 lb Date of Birth:  [birth year]        BSA:          1.790 m Patient Age:    51 years         BP:           130/72 mmHg Patient Gender: F                HR:           65 bpm. [facility] Procedure: 2D Echo, Cardiac Doppler and Color Doppler Indications:    Atrial Fibrillation  History:        Patient has no prior history of Echocardiogram examinations.                 Arrythmias:Atrial Fibrillation.  Sonographer:    [name] Referring Phys: [id number] [name]  IMPRESSIONS  1. Left ventricular ejection fraction, by estimation, is 55 to 60%. The left ventricle has normal function. The left ventricle has no regional wall motion abnormalities. There is moderate left ventricular hypertrophy. Left ventricular diastolic parameters were normal.  2. Right ventricular systolic function is normal. The right ventricular size is normal. There is normal pulmonary artery systolic pressure.  3.  Left atrial size was moderately dilated.  4. The mitral valve is abnormal. Mild mitral valve regurgitation. No evidence of mitral stenosis.  5. The tricuspid valve is abnormal.  6. The aortic valve is tricuspid. There is mild calcification of the aortic valve. There is mild thickening of the aortic valve. Aortic valve regurgitation is not visualized. No aortic stenosis is present.  7. Aortic dilatation noted.  8. The inferior vena cava is normal in size with greater than 50% respiratory variability, suggesting right atrial pressure of 3 mmHg. FINDINGS  Left Ventricle: Left ventricular ejection fraction, by estimation, is 55 to 60%. The left ventricle has normal function. The left ventricle has no regional wall motion abnormalities. The left ventricular internal cavity size was normal in size. There is  moderate left ventricular hypertrophy. Left ventricular diastolic parameters were normal. Right Ventricle: The right ventricular size is normal. No increase in right ventricular wall thickness. Right ventricular systolic function is normal. There is normal pulmonary artery systolic pressure. The tricuspid regurgitant velocity is 2.38 m/s, and  with an assumed right atrial pressure of 3 mmHg, the estimated right ventricular systolic pressure is 25.7 mmHg. Left Atrium: Left atrial size was moderately dilated. Right Atrium: Right atrial size was normal in size. Pericardium: There is no evidence of pericardial effusion. Mitral Valve: The mitral valve is abnormal. There is mild thickening of the mitral  valve leaflet(s). There is mild calcification of the mitral valve leaflet(s). Mild mitral annular calcification. Mild mitral valve regurgitation. No evidence of mitral valve stenosis. MV peak gradient, 4.1 mmHg. The mean mitral valve gradient is 2.0 mmHg. Tricuspid Valve: The tricuspid valve is abnormal. Tricuspid valve regurgitation is trivial. No evidence of tricuspid stenosis. Aortic Valve: The aortic valve is tricuspid. There is mild calcification of the aortic valve. There is mild thickening of the aortic valve. There is mild aortic valve annular calcification. Aortic valve regurgitation is not visualized. No aortic stenosis  is present. Aortic valve mean gradient measures 2.0 mmHg. Aortic valve peak gradient measures 4.5 mmHg. Aortic valve area, by VTI measures 3.28 cm. Pulmonic Valve: The pulmonic valve was not well visualized. Pulmonic valve regurgitation is not visualized. No evidence of pulmonic stenosis. Aorta: The aortic root is normal in size and structure and aortic dilatation noted. Venous: The inferior vena cava is normal in size with greater than 50% respiratory variability, suggesting right atrial pressure of 3 mmHg. IAS/Shunts: No atrial level shunt detected by color flow Doppler.  LEFT VENTRICLE PLAX 2D LVIDd:         5.10 cm     Diastology LVIDs:         3.60 cm     LV e' medial:    9.36 cm/s LV PW:         1.30 cm     LV E/e' medial:  10.2 LV IVS:        1.30 cm     LV e' lateral:   13.60 cm/s LVOT diam:     2.10 cm     LV E/e' lateral: 7.0 LV SV:         82 LV SV Index:   46 LVOT Area:     3.46 cm  LV Volumes (MOD) LV vol d, MOD A2C: 89.9 ml LV vol d, MOD A4C: 78.8 ml LV vol s, MOD A2C: 31.1 ml LV vol s, MOD A4C: 31.2 ml LV SV MOD A2C:     58.8 ml LV SV MOD A4C:  78.8 ml LV SV MOD BP:      51.0 ml RIGHT VENTRICLE RV Basal diam:  3.90 cm RV Mid diam:    3.80 cm RV S prime:     12.60 cm/s TAPSE (M-mode): 2.0 cm LEFT ATRIUM             Index        RIGHT ATRIUM           Index LA diam:         5.10 cm 2.85 cm/m   RA Area:     20.60 cm LA Vol (A2C):   87.3 ml 48.78 ml/m  RA Volume:   57.20 ml  31.96 ml/m LA Vol (A4C):   74.2 ml 41.46 ml/m LA Biplane Vol: 83.3 ml 46.55 ml/m  AORTIC VALVE                    PULMONIC VALVE AV Area (Vmax):    3.40 cm     PV Vmax:       0.79 m/s AV Area (Vmean):   3.14 cm     PV Peak grad:  2.5 mmHg AV Area (VTI):     3.28 cm AV Vmax:           106.00 cm/s AV Vmean:          65.800 cm/s AV VTI:            0.251 m AV Peak Grad:      4.5 mmHg AV Mean Grad:      2.0 mmHg LVOT Vmax:         104.00 cm/s LVOT Vmean:        59.600 cm/s LVOT VTI:          0.238 m LVOT/AV VTI ratio: 0.95  AORTA Ao Root diam: 3.10 cm MITRAL VALVE               TRICUSPID VALVE MV Area (PHT): 3.58 cm    TR Peak grad:   22.7 mmHg MV Area VTI:   2.74 cm    TR Vmax:        238.00 cm/s MV Peak grad:  4.1 mmHg MV Mean grad:  2.0 mmHg    SHUNTS MV Vmax:       1.01 m/s    Systemic VTI:  0.24 m MV Vmean:      63.6 cm/s   Systemic Diam: 2.10 cm MV Decel Time: 212 msec MV E velocity: 95.50 cm/s MV A velocity: 54.00 cm/s MV E/A ratio:  1.77 Dina Rich MD Electronically signed by Dina Rich MD Signature Date/Time: 11/28/2021/1:19:51 PM    Final     Scheduled Meds:  acetaminophen  650 mg Oral Q6H   feeding supplement  237 mL Oral BID BM   methocarbamol  500 mg Oral TID   Continuous Infusions:  lactated ringers 125 mL/hr at 11/28/21 0752     LOS: 0 days   Time spent: 40 mins  Cionna Collantes Laural Benes, MD How to contact the [facility] Attending or Consulting provider 7A - 7P or covering provider during after hours 7P -7A, for this patient?  Check the care team in [facility] and look for a) attending/consulting [facility] provider listed and b) the [facility] team listed Log into [email or web address] and use [facility] universal password to access. If you do not have the password, please contact the hospital operator. Locate the [facility] provider you are looking for under [facility] and page to a number  that you can be  directly reached. If you still have difficulty reaching the provider, please page the [facility] (Director on Call) for the Hospitalists listed on amion for assistance.  11/28/2021, 2:21 PM

## 2021-11-28 NOTE — Assessment & Plan Note (Addendum)
--   Stop all NSAIDs but Dr. Jena Gauss said could continue topical voltaren gel.  ?-- Protonix for GI protection ?-- Scheduled acetaminophen ordered ?

## 2021-11-28 NOTE — Progress Notes (Signed)
A small whelp on left arm developed that is itchy and top left lip is very minimally swollen, pt denies SOB or difficulty breathing, 2nd unit PRBCs ended at 1600, MD made aware, orders obtained for benadryl.   ?

## 2021-11-28 NOTE — Consult Note (Signed)
?Cardiology Consultation:  ? ?Patient ID: [name] ?MRN: [id number]; DOB: 12-10-1970 ? ?Admit date: 11/27/2021 ?Date of Consult: 11/28/2021 ? ?PCP:  Pcp, No ?  ?[facility] Providers ?Cardiologist:  None   { ?   ? ? ?Patient Profile:  ? ?Tami LawmanBarbara D Marsh is a 51 y.o. female with a hx of chronic back pain who is being seen 11/28/2021 for the evaluation of palpitations at the request of Dr Laural BenesJohnson. ? ?History of Present Illness:  ? ?[name] 51 yo female history of chronic back pain presented with palpitations and chest discomfort. Presented to ER, found to be tachycardic highs 170s and hypotensive. Due to hemodynamic instability was cardioverted in ER ? ? ?K 4 Cr 0.98 BUN 23 WBC 6.8 Hgb 7.4 Plt 373 Ferritin 8 Mg 1.6 ?EKG afib 156 ? ?Past Medical History:  ?Diagnosis Date  ? Arthritis   ? Chronic back pain   ? ? ?Past Surgical History:  ?Procedure Laterality Date  ? CHOLECYSTECTOMY    ? REPLACEMENT TOTAL KNEE BILATERAL    ? TUBAL LIGATION    ?  ? ? ?Inpatient Medications: ?Scheduled Meds: ? sodium chloride   Intravenous Once  ? feeding supplement  237 mL Oral BID BM  ? methocarbamol  500 mg Oral TID  ? ?Continuous Infusions: ? lactated ringers 125 mL/hr at 11/28/21 [id number]  ? magnesium sulfate bolus IVPB 4 g (11/28/21 0754)  ? ?PRN Meds: ?acetaminophen, HYDROcodone-acetaminophen ? ?Allergies:    ?Allergies  ?Allergen Reactions  ? Penicillins Nausea And Vomiting  ? ? ?Social History:   ?Social History  ? ?Socioeconomic History  ? Marital status: Legally Separated  ?  Spouse name: Not on file  ? Number of children: Not on file  ? Years of education: Not on file  ? Highest education level: Not on file  ?Occupational History  ? Not on file  ?Tobacco Use  ? Smoking status: Former  ?  Packs/day: 0.50  ?  Years: 20.00  ?  Pack years: 10.00  ?  Types: Cigarettes  ? Smokeless tobacco: Never  ?Substance and Sexual Activity  ? Alcohol use: No  ? Drug use: No  ? Sexual activity: Yes  ?  Birth control/protection: None,  Surgical  ?Other Topics Concern  ? Not on file  ?Social History Narrative  ? Not on file  ? ?Social Determinants of Health  ? ?Financial Resource Strain: Not on file  ?Food Insecurity: Not on file  ?Transportation Needs: Not on file  ?Physical Activity: Not on file  ?Stress: Not on file  ?Social Connections: Not on file  ?Intimate Partner Violence: Not on file  ?  ?Family History:   ? ?Family History  ?Family history unknown: Yes  ?  ? ?ROS:  ?Please see the history of present illness.  ? ?All other ROS reviewed and negative.    ? ?Physical Exam/Data:  ? ?Vitals:  ? 11/28/21 0025 11/28/21 0103 11/28/21 [id number] 11/28/21 0531  ?BP: 105/70 101/66 107/71 127/60  ?Pulse: 74 69 71 79  ?Resp:  19 20 20   ?Temp:  98.5 ?F (36.9 ?C) 98.8 ?F (37.1 ?C) 99.1 ?F (37.3 ?C)  ?TempSrc:  Oral Oral Oral  ?SpO2: 100% 100% 99% 99%  ?Weight:      ?Height:      ? ? ?Intake/Output Summary (Last 24 hours) at 11/28/2021 0857 ?Last data filed at 11/28/2021 0400 ?Gross per 24 hour  ?Intake 584.88 ml  ?Output --  ?Net 584.88 ml  ? ? ?  11/27/2021  ?  9:33 PM 10/24/2021  ?  9:49 AM 09/19/2021  ? 11:59 PM  ?Last 3 Weights  ?Weight (lbs) 150 lb 149 lb 14.6 oz 183 lb 3.2 oz  ?Weight (kg) 68.04 kg 68 kg 83.1 kg  ?   ?Body mass index is 23.49 kg/m?.  ?General:  Well nourished, well developed, in no acute distress ?HEENT: normal ?Neck: no JVD ?Vascular: No carotid bruits; Distal pulses 2+ bilaterally ?Cardiac:  normal S1, S2; RRR; no murmur  ?Lungs:  clear to auscultation bilaterally, no wheezing, rhonchi or rales  ?Abd: soft, nontender, no hepatomegaly  ?Ext: no edema ?Musculoskeletal:  No deformities, BUE and BLE strength normal and equal ?Skin: warm and dry  ?Neuro:  CNs 2-12 intact, no focal abnormalities noted ?Psych:  Normal affect  ? ? ? ?Laboratory Data: ? ?High Sensitivity Troponin:  No results for input(s): TROPONINIHS in the last 720 hours.   ?Chemistry ?Recent Labs  ?Lab 11/27/21 ?2140 11/27/21 ?2145 11/28/21 ?9604  ?NA 137 140 139  ?K 4.0 4.4 3.9   ?CL 107 106 115*  ?CO2 22  --  23  ?GLUCOSE 95 90 79  ?BUN 23* 25* 16  ?CREATININE 0.98 1.10* 0.73  ?CALCIUM 8.9  --  8.1*  ?MG  --   --  1.6*  ?GFRNONAA >60  --  >60  ?ANIONGAP 8  --  1*  ?  ?Recent Labs  ?Lab 11/27/21 ?2140 11/28/21 ?5409  ?PROT 6.9 5.6*  ?ALBUMIN 3.0* 2.5*  ?AST 23 16  ?ALT 20 15  ?ALKPHOS 123 93  ?BILITOT 0.4 0.1*  ? ?Lipids No results for input(s): CHOL, TRIG, HDL, LABVLDL, LDLCALC, CHOLHDL in the last 168 hours.  ?Hematology ?Recent Labs  ?Lab 11/27/21 ?2140 11/27/21 ?2145 11/28/21 ?8119 11/28/21 ?1478  ?WBC 6.8  --  5.3  --   ?RBC 3.16*  --  2.32*  --   ?HGB 7.4* 9.2* 5.5* 5.9*  ?HCT 27.2* 27.0* 19.8* 20.8*  ?MCV 86.1  --  85.3  --   ?MCH 23.4*  --  23.7*  --   ?MCHC 27.2*  --  27.8*  --   ?RDW 17.5*  --  17.5*  --   ?PLT 373  --  289  --   ? ?Thyroid No results for input(s): TSH, FREET4 in the last 168 hours.  ?BNPNo results for input(s): BNP, PROBNP in the last 168 hours.  ?DDimer No results for input(s): DDIMER in the last 168 hours. ? ? ?Radiology/Studies:  ?No results found. ? ? ?Assessment and Plan:  ? ?1.New onset afib ?- new diagnosis of afib, presented with RVR ?- hypotensive in ER, was urgently cardioverted. BP's not much improved after cardioversion, appears hypotension may have been more driven by hypovolemia and severe anemia ?- CHADS2Vasc score is 1. Given she was cardioverted 1 mont of anticoag would be indicated but would not require long term anticoagulation. Sh received one dose of eliquis yesterday but due to worsening anemia today has been stopped ?- continue to hold eliquis, if cleared from anemia standoint by primary team and GI would need for 4 weeks after DCCV. ?-bp's soft at times, hold on starting av nodal agent today.  ? ? ?2.Iron deficient anemia ?- ferritin 8, Hgb 7.4 on admission.Down to 5.5 today ?- management per primary team, due for transfusion ?- eliquis has been stopped ? ? ?We will follow up telemetry and patient peripherally tomorrow.  ? ? ? ?Risk  Assessment/Risk Scores:  ? ?  ? ?  CHA2DS2-VASc Score = 1  ? This indicates a 0.6% annual risk of stroke. ?The patient's score is based upon: ?CHF History: 0 ?HTN History: 0 ?Diabetes History: 0 ?Stroke History: 0 ?Vascular Disease History: 0 ?Age Score: 0 ?Gender Score: 1 ?  ?  ? ? ?For questions or updates, please contact [facility] ?Please consult [email or web address] for contact info under  ? ? ?Signed, ?Dina Rich, MD  ?11/28/2021 8:57 AM ? ?

## 2021-11-28 NOTE — Progress Notes (Signed)
RN called due to patient's hemoglobin at 5.5 this morning.  Hemoglobin was 7.4 on admission yesterday.  She denies any blood in stool or vomiting of blood.  H/H will be repeated.  Patient was started on Eliquis on admission, this will be temporarily held at this time.  Patient may require blood transfusion based on repeat H/H. ? ?Total time:  7 minutes ?This includes time reviewing the chart including progress notes, labs, EKGs, taking medical decisions, ordering labs and documenting findings. ? ?

## 2021-11-28 NOTE — Progress Notes (Signed)
*  PRELIMINARY RESULTS* ?Echocardiogram ?2D Echocardiogram has been performed. ? ?Carolyne Fiscal ?11/28/2021, 12:37 PM ?

## 2021-11-28 NOTE — Assessment & Plan Note (Signed)
IV replacement ordered.  ?Recheck in AM ?

## 2021-11-28 NOTE — Progress Notes (Signed)
Critical Lab Value: Hgb 5.5. Hospitalist notified. ?

## 2021-11-28 NOTE — H&P (View-Only) (Signed)
?Referring Provider: No ref. provider found ?Primary Care Physician:  Pcp, No ?Primary Gastroenterologist:  not previously established ? ?Date of Admission: 11/27/21 ?Date of Consultation: 11/28/21 ? ?Reason for Consultation:  IDA ? ?HPI:  ?[name] is a 51 y.o. year old female with medical history of anemia, arthritis, chronic back pain who presented to the ED yesterday for Chest pain and palpiatations. Patietn reported waking approx 3 hours prior to her arrival to ED with worsening palpitations and CP. Found to be in Afib with RVR. Hypotensive with SBP in 70s on arrivial with HR between 140-170bpm. Denies any hx of the same. Not previously on anticoagulation. Denied sob, abd pain, nausea or vomiting. Did report decreased PO intake over the past few days.  ? ?ED Course: ?Hypotensive in the 70s, HR initially 140-170 range, successful cardioversion for Afib with RVR with return to NSR, HR in the 70s. Started on Eliquis, IVF initiated.  ?Hgb 7.4 with MCV 86.1, Ptl 373K, BUN 23, ferritin 8, iron 13, TIBC 285, Saturation 5%, mag 1.6 ? ?Consult: ?Hgb dropped to 5.5 this morning after starting on Eliquis yesterday. H&H was repeated for verification with repeat of 5.9. PRBC transfusion x2 initiated. Eliquis now on hold. ? ?Patient reports that she has had some chest pain in the past but different from this admission. States she works 3rd shift, she got up around 1800, feeling dizzy, nausea and like her heart "was coming out of my chest." She denies any history of cardiac issues in the past. No new medications prior to admission. Reports hx of anemia in the past after a knee replacement but no other time. Denies rectal bleeding or melena. States SOB yesterday when in Afib but this has resolved. Denies constipation or diarrhea. Has 1 BM every other day. Denies any recent changes in her bowel habits. No hx of colonoscopy or upper endoscopy in the past. Patient does endorse recent nausea after eating for the past 3-4  weeks. She did have one episode of foamy like emesis. Denies any abdominal pain, bloating or early satiety. Denies any issues with dysphagia or odynophagia. Denies any issues with heartburn or acid regurgitation. She reports that she takes two aleve per day, she has been using these for the past 2-3 years. Also on diclofenac 75mg  BID, has been on this for a while as well. She reports that appetite has been down due to nausea, she has lost about 30 pounds over the past 6 months. She has also been drinking only water recently. She denies any other sources of blood loss such as epistaxis or vaginal bleeding.  ? ? ?EGD: never ?Colonoscopy:never ? ?Family: no CRC or liver disease ?Social: smoke 2-3 cigarettes per day, no ETOH ? ?Past Medical History:  ?Diagnosis Date  ? Arthritis   ? Chronic back pain   ? ? ?Past Surgical History:  ?Procedure Laterality Date  ? CHOLECYSTECTOMY    ? REPLACEMENT TOTAL KNEE BILATERAL    ? TUBAL LIGATION    ? ? ?Prior to Admission medications   ?Medication Sig Start Date End Date Taking? Authorizing Provider  ?azithromycin (ZITHROMAX) 250 MG tablet Take 1 tablet (250 mg total) by mouth daily. Take first 2 tablets together, then 1 every day until finished. 09/20/21   Orpah Greek, MD  ?cefdinir (OMNICEF) 300 MG capsule Take 1 capsule (300 mg total) by mouth 2 (two) times daily. 09/20/21   Orpah Greek, MD  ?diclofenac (VOLTAREN) 75 MG EC tablet TAKE (1) TABLET TWICE A  DAY. 10/01/21   Sanjuana Kava, MD  ?HYDROcodone-acetaminophen ([place]) 7.5-325 MG tablet Take 1 tablet by mouth every 6 (six) hours as needed for moderate pain (Must last 30 days). 11/22/21   Sanjuana Kava, MD  ?methocarbamol (ROBAXIN) 500 MG tablet Take 1 tablet (500 mg total) by mouth 3 (three) times daily. 10/24/21   Triplett, Tammy, PA-C  ?predniSONE (DELTASONE) 20 MG tablet Take 2 tablets (40 mg total) by mouth daily. 10/24/21   Triplett, Tammy, PA-C  ?triamcinolone cream (KENALOG) 0.1 % Apply small amount to  the affected areas of both hands twice daily 10/24/21   Kem Parkinson, PA-C  ? ? ?Current Facility-Administered Medications  ?Medication Dose Route Frequency Provider Last Rate Last Admin  ? acetaminophen (TYLENOL) tablet 650 mg  650 mg Oral Q6H PRN Adefeso, Oladapo, DO   650 mg at 11/28/21 0932  ? feeding supplement (ENSURE ENLIVE / ENSURE PLUS) liquid 237 mL  237 mL Oral BID BM Adefeso, Oladapo, DO      ? HYDROcodone-acetaminophen ([place]) 7.5-325 MG per tablet 1 tablet  1 tablet Oral Q6H PRN Adefeso, Oladapo, DO      ? lactated ringers infusion   Intravenous Continuous Adefeso, Oladapo, DO 125 mL/hr at 11/28/21 0752 New Bag at 11/28/21 0752  ? methocarbamol (ROBAXIN) tablet 500 mg  500 mg Oral TID Bernadette Hoit, DO      ? ? ?Allergies as of 11/27/2021 - Review Complete 11/27/2021  ?Allergen Reaction Noted  ? Penicillins Nausea And Vomiting 07/31/2011  ? ? ?Family History  ?Family history unknown: Yes  ? ? ?Social History  ? ?Socioeconomic History  ? Marital status: Legally Separated  ?  Spouse name: Not on file  ? Number of children: Not on file  ? Years of education: Not on file  ? Highest education level: Not on file  ?Occupational History  ? Not on file  ?Tobacco Use  ? Smoking status: Former  ?  Packs/day: 0.50  ?  Years: 20.00  ?  Pack years: 10.00  ?  Types: Cigarettes  ? Smokeless tobacco: Never  ?Substance and Sexual Activity  ? Alcohol use: No  ? Drug use: No  ? Sexual activity: Yes  ?  Birth control/protection: None, Surgical  ?Other Topics Concern  ? Not on file  ?Social History Narrative  ? Not on file  ? ?Social Determinants of Health  ? ?Financial Resource Strain: Not on file  ?Food Insecurity: Not on file  ?Transportation Needs: Not on file  ?Physical Activity: Not on file  ?Stress: Not on file  ?Social Connections: Not on file  ?Intimate Partner Violence: Not on file  ? ? ?Review of Systems: ?Gen: Denies fever, chills, loss of appetite, change in weight or weight loss ?CV: Denies chest pain,  heart palpitations, syncope, edema  ?Resp: Denies shortness of breath with rest, cough, wheezing ?GI: Denies dysphagia or odynophagia. Denies vomiting blood, jaundice, and fecal incontinence.  ?GU : Denies urinary burning, urinary frequency, urinary incontinence.  ?MS: Denies joint pain,swelling, cramping ?Derm: Denies rash, itching, dry skin ?Psych: Denies depression, anxiety,confusion, or memory loss ?Heme: Denies bruising, bleeding, and enlarged lymph nodes. ? ?Physical Exam: ?Vital signs in last 24 hours: ?Temp:  [98.1 ?F (36.7 ?C)-99.5 ?F (37.5 ?C)] 98.9 ?F (37.2 ?C) (03/29 1002) ?Pulse Rate:  [69-167] 70 (03/29 1002) ?Resp:  [14-28] 18 (03/29 1002) ?BP: (76-130)/(46-85) 130/72 (03/29 1002) ?SpO2:  [95 %-100 %] 99 % (03/29 1002) ?Weight:  [68 kg] 68 kg (03/28 2133) ?Last BM Date :  11/26/21 ?General:   Alert,  Well-developed, well-nourished, pleasant and cooperative in NAD ?Head:  Normocephalic and atraumatic. ?Eyes:  Sclera clear, no icterus.   Conjunctiva pink. ?Ears:  Normal auditory acuity. ?Nose:  No deformity, discharge,  or lesions. ?Mouth:  No deformity or lesions, dentition normal. ?Lungs:  Clear throughout to auscultation.   No wheezes, crackles, or rhonchi. No acute distress. ?Heart:  Regular rate and rhythm; no murmurs, clicks, rubs,  or gallops. ?Abdomen:  Soft, nontender and nondistended. No masses, hepatosplenomegaly or hernias noted. Normal bowel sounds, without guarding, and without rebound.   ?Rectal:  Deferred until time of colonoscopy.   ?Msk:  Symmetrical without gross deformities. Normal posture. ?Pulses:  Normal pulses noted. ?Extremities:  Without clubbing or edema. ?Neurologic:  Alert and  oriented x4;  grossly normal neurologically. ?Skin:  Intact without significant lesions or rashes. ?Psych:  Alert and cooperative. Normal mood and affect. ? ?Intake/Output from previous day: ?03/28 0701 - 03/29 0700 ?In: 584.9 [I.V.:584.9] ?Out: -  ?Intake/Output this shift: ?Total I/O ?In: 423.5  [I.V.:423.5] ?Out: -  ? ?Lab Results: ?Recent Labs  ?  11/27/21 ?2140 11/27/21 ?2145 11/28/21 ?[id number] 11/28/21 ?[id number]  ?WBC 6.8  --  5.3  --   ?HGB 7.4* 9.2* 5.5* 5.9*  ?HCT 27.2* 27.0* 19.8* 20.8*  ?PLT 373  --

## 2021-11-28 NOTE — Assessment & Plan Note (Addendum)
--   [name] cardiology consultation and recommendations ?-- Follow-up TSH WNL ?-- Follow-up 2D echocardiogram results LVEF 50-55% with LVH.  ?-- cardiology team started patient on lopressor 25 mg BID  ?-- Held anticoagulation until GI work-up is completed ?--Pt would only need to do 30 days of apixaban per Dr. Harl Bowie.  ?-- Given chronic NSAID use, suspect upper GI bleeding with possible ulceration ?-- Appreciate GI recs.  Per Dr. Gala Romney pt can do 30 days of apixaban if patient stops all systemic NSAIDS, continue protonix 40 mg BID.   ?

## 2021-11-28 NOTE — Hospital Course (Addendum)
51 y.o. female with medical history significant for arthritis, chronic back pain who presents to the emergency department accompanied by son due to palpitations and chest discomfort which started this evening.  Patient works third shift, so when she woke up around 6 PM, she felt palpitations on left side of her chest, on getting up from bed, she felt dizzy, so she laid back in bed and called her son who brought her to the ED for further evaluation and management.  Patient states that she has never had such palpitations before.  She denies nausea, vomiting, shortness of breath, fever, chills, abdominal pain. Patient states that she has had decreased oral intake over the last few days. ?  ?ED Course:  ?In the emergency department, patient was reported to be hypotensive in the 70s and HR between 140 and 170.  Patient was cardioverted to normal sinus rhythm with HR improving to 70s per minute, BP 99/79 and other vital signs being within normal range.  Work-up in the ED showed normocytic anemia and normal BMP except for slight elevation in BUN at 23, albumin 3.0 ?Patient was started on Eliquis, IV hydration was provided.  Hospitalist was asked to admit patient for further evaluation and management. ? ?11/28/2021:  GI and cardiology consultation requested.  Further recommendations to follow.  ?Transfuse 2 units PRBC for Hg down to 5.5.   ? ?11/29/2021: Hg up to 7.6 after 2 units PRBC.  EGD with extensive gastric ulceration extending into the duodenal bulb ? ?11/30/2021: Pt feeling much better today.  She is eating and drinking better.  She verbalized understanding of instructions regarding protonix. Avoiding all oral/systemic NSAIDS.  Dr. Jena Gaussourk said she could use topical voltaren gel.   ?

## 2021-11-29 ENCOUNTER — Encounter (HOSPITAL_COMMUNITY): Payer: Self-pay | Admitting: Internal Medicine

## 2021-11-29 ENCOUNTER — Observation Stay (HOSPITAL_COMMUNITY): Payer: BC Managed Care – PPO | Admitting: Anesthesiology

## 2021-11-29 ENCOUNTER — Encounter (HOSPITAL_COMMUNITY)
Admission: EM | Disposition: A | Discharge status: Home / Self Care | Payer: Self-pay | Source: Home / Self Care | Attending: Family Medicine

## 2021-11-29 DIAGNOSIS — R1013 Epigastric pain: Secondary | ICD-10-CM

## 2021-11-29 DIAGNOSIS — M545 Low back pain, unspecified: Secondary | ICD-10-CM | POA: Diagnosis not present

## 2021-11-29 DIAGNOSIS — D649 Anemia, unspecified: Secondary | ICD-10-CM | POA: Diagnosis not present

## 2021-11-29 DIAGNOSIS — E8809 Other disorders of plasma-protein metabolism, not elsewhere classified: Secondary | ICD-10-CM | POA: Diagnosis not present

## 2021-11-29 DIAGNOSIS — I480 Paroxysmal atrial fibrillation: Secondary | ICD-10-CM | POA: Diagnosis not present

## 2021-11-29 DIAGNOSIS — D509 Iron deficiency anemia, unspecified: Secondary | ICD-10-CM | POA: Diagnosis not present

## 2021-11-29 HISTORY — PX: ESOPHAGOGASTRODUODENOSCOPY (EGD) WITH PROPOFOL: SHX5813

## 2021-11-29 HISTORY — PX: BIOPSY: SHX5522

## 2021-11-29 LAB — TYPE AND SCREEN
ABO/RH(D): A POS
Antibody Screen: NEGATIVE
Unit division: 0
Unit division: 0

## 2021-11-29 LAB — BASIC METABOLIC PANEL
Anion gap: 5 (ref 5–15)
BUN: 9 mg/dL (ref 6–20)
CO2: 23 mmol/L (ref 22–32)
Calcium: 8.5 mg/dL — ABNORMAL LOW (ref 8.9–10.3)
Chloride: 110 mmol/L (ref 98–111)
Creatinine, Ser: 0.66 mg/dL (ref 0.44–1.00)
GFR, Estimated: >60 mL/min (ref >60)
Glucose, Bld: 79 mg/dL (ref 70–99)
Potassium: 3.8 mmol/L (ref 3.5–5.1)
Sodium: 138 mmol/L (ref 135–145)

## 2021-11-29 LAB — BPAM RBC
Blood Product Expiration Date: 202304012359
Blood Product Expiration Date: 202304172359
ISSUE DATE / TIME: 202303290941
ISSUE DATE / TIME: 202303291355
Unit Type and Rh: 600
Unit Type and Rh: 6200

## 2021-11-29 LAB — CBC
HCT: 25.7 % — ABNORMAL LOW (ref 36.0–46.0)
Hemoglobin: 7.6 g/dL — ABNORMAL LOW (ref 12.0–15.0)
MCH: 25.2 pg — ABNORMAL LOW (ref 26.0–34.0)
MCHC: 29.6 g/dL — ABNORMAL LOW (ref 30.0–36.0)
MCV: 85.1 fL (ref 80.0–100.0)
Platelets: 252 10*3/uL (ref 150–400)
RBC: 3.02 MIL/uL — ABNORMAL LOW (ref 3.87–5.11)
RDW: 17.4 % — ABNORMAL HIGH (ref 11.5–15.5)
WBC: 4.4 10*3/uL (ref 4.0–10.5)
nRBC: 0 % (ref 0.0–0.2)

## 2021-11-29 LAB — TSH: TSH: 2.459 u[IU]/mL (ref 0.350–4.500)

## 2021-11-29 LAB — MAGNESIUM: Magnesium: 2 mg/dL (ref 1.7–2.4)

## 2021-11-29 SURGERY — ESOPHAGOGASTRODUODENOSCOPY (EGD) WITH PROPOFOL
Anesthesia: General

## 2021-11-29 MED ORDER — METOPROLOL TARTRATE 25 MG PO TABS
25.0000 mg | ORAL_TABLET | Freq: Two times a day (BID) | ORAL | Status: DC
  Administered 2021-11-29 – 2021-11-30 (×3): 25 mg via ORAL
  Filled 2021-11-29 (×3): qty 1

## 2021-11-29 MED ORDER — ONDANSETRON HCL 4 MG/2ML IJ SOLN
4.0000 mg | Freq: Four times a day (QID) | INTRAMUSCULAR | Status: DC | PRN
  Filled 2021-11-29: qty 2

## 2021-11-29 MED ORDER — LACTATED RINGERS IV SOLN: INTRAVENOUS | Status: DC

## 2021-11-29 MED ORDER — LIDOCAINE HCL (CARDIAC) PF 50 MG/5ML IV SOSY
PREFILLED_SYRINGE | INTRAVENOUS | Status: DC | PRN
  Administered 2021-11-29: 100 mg via INTRAVENOUS

## 2021-11-29 MED ORDER — PANTOPRAZOLE SODIUM 40 MG PO TBEC
40.0000 mg | DELAYED_RELEASE_TABLET | Freq: Two times a day (BID) | ORAL | Status: DC
  Administered 2021-11-29 – 2021-11-30 (×2): 40 mg via ORAL
  Filled 2021-11-29 (×3): qty 1

## 2021-11-29 MED ORDER — PROPOFOL 10 MG/ML IV BOLUS
INTRAVENOUS | Status: DC | PRN
  Administered 2021-11-29: 50 mg via INTRAVENOUS
  Administered 2021-11-29: 130 mg via INTRAVENOUS

## 2021-11-29 MED ORDER — SODIUM CHLORIDE 0.9 % IV SOLN: INTRAVENOUS | Status: DC

## 2021-11-29 NOTE — Op Note (Signed)
[facility] ?Patient Name: [name] ?Procedure Date: 11/29/2021 11:12 AM ?MRN: [id number] ?Date of Birth: 18-Dec-1970 ?Attending MD: Gennette Pacobert Michael Opaline Reyburn , MD ?CSN: [id number] ?Age: [age] ?Admit Type: Inpatient ?Procedure:                Upper GI endoscopy ?Indications:              Epigastric abdominal pain, Unexplained iron  ?                          deficiency anemia ?Providers:                Gennette Pacobert Michael Peter Daquila, MD, Jannett CelestineAnitra Bell, RN, Elson AreasNeville  ?                          Onalee Huaavid, Technician ?Referring MD:              ?Medicines:                Propofol per Anesthesia ?Complications:            No immediate complications. ?Estimated Blood Loss:     Estimated blood loss was minimal. ?Procedure:                Pre-Anesthesia Assessment: ?                          - Prior to the procedure, a History and Physical  ?                          was performed, and patient medications and  ?                          allergies were reviewed. The patient's tolerance of  ?                          previous anesthesia was also reviewed. The risks  ?                          and benefits of the procedure and the sedation  ?                          options and risks were discussed with the patient.  ?                          All questions were answered, and informed consent  ?                          was obtained. Prior Anticoagulants: The patient  ?                          last took Eliquis (apixaban) 2 days prior to the  ?                          procedure. ASA Grade Assessment: IV - A patient  ?  with severe systemic disease that is a constant  ?                          threat to life. After reviewing the risks and  ?                          benefits, the patient was deemed in satisfactory  ?                          condition to undergo the procedure. ?                          After obtaining informed consent, the endoscope was  ?                          passed under direct vision.  Throughout the  ?                          procedure, the patient's blood pressure, pulse, and  ?                          oxygen saturations were monitored continuously. The  ?                          GIF-H190 ([id number]) scope was introduced through the  ?                          mouth, and advanced to the second part of duodenum.  ?                          The upper GI endoscopy was accomplished without  ?                          difficulty. The patient tolerated the procedure  ?                          well. ?Scope In: 11:18:18 AM ?Scope Out: 11:28:00 AM ?Total Procedure Duration: 0 hours 9 minutes 42 seconds  ?Findings: ?     The examined esophagus was normal. Moderate amount of retained gastric  ?     contents in the body of the stomach. Patient had extensive gastric  ?     ulceration with multiple solitary ulcers in the body, lesser and greater  ?     curvature - more confluent ulceration in the antrum extending through  ?     the pyloric channel which was markedly stenotic and dilated with scope  ?     passage. Ulceration in the mid bulb; distal duodenal bulb and D2  ?     appeared normal. No overt blood or clot in the stomach. All the ulcers  ?     appeared to be benign ([place] 3). Some of the gastric mucosa in the  ?     body could not be surveyed due to retained gastric contents. Most of the  ?     stomach was seen. Biopsies of the ulceration antrum and body taken  for  ?     histologic study. ?Impression:               - Normal esophagus. Extensive gastric ulceration  ?                          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                          (pyloric channel dilated nicely with scope passage  ?                          only) ?                          -Distal bulb and second portion of the duodenum  ?                          appeared normal ?                          -Stomach not completely seen as  outlined above.  ?                          Findings most likely induced by excessive NSAID  ?                          use. Biopsies pending ?Moderate Sedation: ?     Moderate (conscious) sedation was personally administered by an  ?     anesthesia professional. The following parameters were monitored: oxygen  ?     saturation, heart rate, blood pressure, respiratory rate, EKG, adequacy  ?     of pulmonary ventilation, and response to care. ?Recommendation:           - Return patient to hospital ward for ongoing care. ?                          - Advance diet as tolerated. ?                          - Continue present medications. Transition PPI to  ?                          Protonix 40 mg orally twice daily x12 weeks. Low  ?                          residue diet. Follow-up on pathology. Patient will  ?                          need to return for repeat EGD and first ever  ?                          colonoscopy in 12 weeks ?                          -  Patient should absolutely avoid all systemic NSAID  ?                          agents; May use topical Voltaren. If felt to be  ?                          clinically indicated, Eliquis can be resumed with  ?                          close monitoring of H&H -with high-dose acid  ?                          suppression therapy and cessation of systemic NSAID  ?                          therapy. If she is compliant, she will likely do  ?                          okay. May be dischargeable from a GI standpoint  ?                          within the next 24 hours. ?Procedure Code(s):        --- Professional --- ?                          [phone], Esophagogastroduodenoscopy, flexible,  ?                          transoral; diagnostic, including collection of  ?                          specimen(s) by brushing or washing, when performed  ?                          (separate procedure) ?Diagnosis Code(s):        --- Professional --- ?                          R10.13, Epigastric  pain ?                          D50.9, Iron deficiency anemia, unspecified ?CPT copyright 2019 [employer]. All rights reserved. ?The codes documented in this report are preliminary and upon coder review may  ?be revised to meet current compliance requirements. ?Gerrit Friends. Lavonya Hoerner, MD ?Gennette Pac, MD ?11/29/2021 12:01:20 PM ?This report has been signed electronically. ?Number of Addenda: 0 ?

## 2021-11-29 NOTE — Assessment & Plan Note (Addendum)
--  from chronic blood loss ?--extensive gastric ulceration being treated with protonix BID for 12 weeks ?--repeating EGD in [redacted] weeks along with screening colonoscopy ?--GI arranged for repeat CBC in 1 week ?

## 2021-11-29 NOTE — Assessment & Plan Note (Signed)
--  continue supportive measures  ?

## 2021-11-29 NOTE — Progress Notes (Signed)
?PROGRESS NOTE ? ? ?[name]  [id number] DOB: 1971/07/02 DOA: 11/27/2021 ?PCP: Pcp, No  ? ?Chief Complaint  ?Patient presents with  ? Chest Pain  ? ?Level of care: Med-Surg ? ?Brief Admission History:  ? 51 y.o. female with medical history significant for arthritis, chronic back pain who presents to the emergency department accompanied by son due to palpitations and chest discomfort which started this evening.  Patient works third shift, so when she woke up around 6 PM, she felt palpitations on left side of her chest, on getting up from bed, she felt dizzy, so she laid back in bed and called her son who brought her to the ED for further evaluation and management.  Patient states that she has never had such palpitations before.  She denies nausea, vomiting, shortness of breath, fever, chills, abdominal pain. Patient states that she has had decreased oral intake over the last few days. ?  ?ED Course:  ?In the emergency department, patient was reported to be hypotensive in the 70s and HR between 140 and 170.  Patient was cardioverted to normal sinus rhythm with HR improving to 70s per minute, BP 99/79 and other vital signs being within normal range.  Work-up in the ED showed normocytic anemia and normal BMP except for slight elevation in BUN at 23, albumin 3.0 ?Patient was started on Eliquis, IV hydration was provided.  Hospitalist was asked to admit patient for further evaluation and management. ? ?11/28/2021:  GI and cardiology consultation requested.  Further recommendations to follow.  ?Transfuse 2 units PRBC for Hg down to 5.5.   ? ?11/29/2021: Hg up to 7.6 after 2 units PRBC.  EGD with extensive gastric ulceration extending into the duodenal bulb ?  ?Assessment and Plan: ?* Paroxysmal atrial fibrillation with RVR ([place]) ?-- [place] cardiology consultation and recommendations ?-- Follow-up TSH WNL ?-- Follow-up 2D echocardiogram results LVEF 50-55% with LVH.  ?-- cardiology team started patient on  lopressor 25 mg BID  ?-- Holding anticoagulation until GI work-up is completed ?--Pt would only need to do 30 days of apixaban per Dr. Harl Bowie.  ?-- Given chronic NSAID use, suspect upper GI bleeding with possible ulceration ?-- Further recommendations pending GI evaluation ? ?Nausea without vomiting ?--continue supportive measures  ? ?Loss of weight ?--GI team planning colonoscopy in 12 weeks with repeat EGD at that time  ? ?NSAID long-term use ?--with extensive gastric ulceration, GI team recommending discontinue all systemic steroid usage, protonix 40 mg BID  ? ?Iron deficiency anemia ?--from chronic blood loss ?--extensive gastric ulceration being treated with protonix BID for 12 weeks ?--repeating EGD in [redacted] weeks along with screening colonoscopy ? ?Hypomagnesemia ?--IV replacement ordered.  Recheck in AM.  ? ?Hypoalbuminemia ?-- Stable likely secondary to poor oral intake ? ?Severe iron deficiency anemia ?-- Chronic blood loss from upper GI bleeding related to chronic NSAID usage and subsequent extensive gastric ulceration seen on EGD.  ?--protonix 40 mg BID ordered by GI for 12 weeks.  STOP systemic NSAIDS.  ?--repeat EGD and colonoscopy in 12 weeks  ? ?Arthritis ?-- Stop all NSAIDs but Dr. Gala Romney said could continue topical voltaren gel.  ?-- Protonix for GI protection ?-- Scheduled acetaminophen ordered ? ?Midline low back pain without sciatica ?-- Explained to patient that oral NSAIDs not recommended at this time given issues with GI bleeding ?-- Topical Voltaren gel ordered and oral Tylenol ?-- Further recommendations pending GI work-up ? ?DVT prophylaxis: SCDs ?Code Status: Full  ?Family Communication: bedside update  3/29 ?Disposition: Status is: Observation ?The patient remains OBS appropriate and will d/c before 2 midnights. ?  ?Consultants:  ?GI  ?Procedures:  ?TBD pending GI  ?Antimicrobials:  ?N/a   ?Subjective: ?Pt feels less weak after 2 units of PRBC given.     ?Objective: ?Vitals:  ? 11/29/21 1132  11/29/21 1145 11/29/21 1200 11/29/21 1233  ?BP: 122/67 127/79 110/79 130/69  ?Pulse: 61 (!) 57 (!) 56 (!) 56  ?Resp: 16 17 15 18   ?Temp: 98.5 ?F (36.9 ?C)   97.6 ?F (36.4 ?C)  ?TempSrc:    Oral  ?SpO2: 100% 98% 99% 100%  ?Weight:      ?Height:      ? ? ?Intake/Output Summary (Last 24 hours) at 11/29/2021 1538 ?Last data filed at 11/29/2021 1300 ?Gross per 24 hour  ?Intake 3378.42 ml  ?Output --  ?Net 3378.42 ml  ? ?Filed Weights  ? 11/27/21 2133  ?Weight: 68 kg  ? ?Examination: ? ?General exam: Pt appears very pale.  Appears calm and comfortable  ?Respiratory system: Clear to auscultation. Respiratory effort normal. ?Cardiovascular system: normal S1 & S2 heard. No JVD, murmurs, rubs, gallops or clicks. No pedal edema. ?Gastrointestinal system: Abdomen is nondistended, soft and nontender. No organomegaly or masses felt. Normal bowel sounds heard. ?Central nervous system: Alert and oriented. No focal neurological deficits. ?Extremities: Symmetric 5 x 5 power. ?Skin: No rashes, lesions or ulcers. ?Psychiatry: Judgement and insight appear normal. Mood & affect appropriate.  ? ?Data Reviewed: I have personally reviewed following labs and imaging studies ? ?CBC: ?Recent Labs  ?Lab 11/27/21 ?2140 11/27/21 ?2145 11/28/21 ?[id number] 11/28/21 ?[id number] 11/29/21 ?[id number]  ?WBC 6.8  --  5.3  --  4.4  ?NEUTROABS 4.2  --   --   --   --   ?HGB 7.4* 9.2* 5.5* 5.9* 7.6*  ?HCT 27.2* 27.0* 19.8* 20.8* 25.7*  ?MCV 86.1  --  85.3  --  85.1  ?PLT 373  --  289  --  252  ? ? ?Basic Metabolic Panel: ?Recent Labs  ?Lab 11/27/21 ?2140 11/27/21 ?2145 11/28/21 ?[id number] 11/29/21 ?0442  ?NA 137 140 139 138  ?K 4.0 4.4 3.9 3.8  ?CL 107 106 115* 110  ?CO2 22  --  23 23  ?GLUCOSE 95 90 79 79  ?BUN 23* 25* 16 9  ?CREATININE 0.98 1.10* 0.73 0.66  ?CALCIUM 8.9  --  8.1* 8.5*  ?MG  --   --  1.6* 2.0  ?PHOS  --   --  2.8  --   ? ? ?CBG: ?No results for input(s): GLUCAP in the last 168 hours. ? ?No results found for this or any previous visit (from the past 240 hour(s)).   ? ?Radiology Studies: ?ECHOCARDIOGRAM COMPLETE ? ?Result Date: 11/28/2021 ?   ECHOCARDIOGRAM REPORT   Patient Name:   RONYA DRAIN Date of Exam: 11/28/2021 Medical Rec #:  [id number]        Height:       67.0 in Accession #:    [id number]       Weight:       150.0 lb Date of Birth:  Jun 10, 1971        BSA:          1.790 m? Patient Age:    19 years         BP:           130/72 mmHg Patient Gender: F  HR:           65 bpm. Exam Location:  [facility] Procedure: 2D Echo, Cardiac Doppler and Color Doppler Indications:    Atrial Fibrillation  History:        Patient has no prior history of Echocardiogram examinations.                 Arrythmias:Atrial Fibrillation.  Sonographer:    Wenda Low Referring Phys: [id number] [place]  1. Left ventricular ejection fraction, by estimation, is 55 to 60%. The left ventricle has normal function. The left ventricle has no regional wall motion abnormalities. There is moderate left ventricular hypertrophy. Left ventricular diastolic parameters were normal.  2. Right ventricular systolic function is normal. The right ventricular size is normal. There is normal pulmonary artery systolic pressure.  3. Left atrial size was moderately dilated.  4. The mitral valve is abnormal. Mild mitral valve regurgitation. No evidence of mitral stenosis.  5. The tricuspid valve is abnormal.  6. The aortic valve is tricuspid. There is mild calcification of the aortic valve. There is mild thickening of the aortic valve. Aortic valve regurgitation is not visualized. No aortic stenosis is present.  7. Aortic dilatation noted.  8. The inferior vena cava is normal in size with greater than 50% respiratory variability, suggesting right atrial pressure of 3 mmHg. FINDINGS  Left Ventricle: Left ventricular ejection fraction, by estimation, is 55 to 60%. The left ventricle has normal function. The left ventricle has no regional wall motion abnormalities. The left ventricular  internal cavity size was normal in size. There is  moderate left ventricular hypertrophy. Left ventricular diastolic parameters were normal. Right Ventricle: The right ventricular size is normal. No increas

## 2021-11-29 NOTE — Assessment & Plan Note (Signed)
--  GI team planning colonoscopy in 12 weeks with repeat EGD at that time  ?

## 2021-11-29 NOTE — Interval H&P Note (Signed)
History and Physical Interval Note: ? ?11/29/2021 ?11:04 AM ? ?[name]  has presented today for surgery, with the diagnosis of nausea weight loss. iron deficiency anemia.  The various methods of treatment have been discussed with the patient and family. After consideration of risks, benefits and other options for treatment, the patient has consented to  Procedure(s): ?ESOPHAGOGASTRODUODENOSCOPY (EGD) WITH PROPOFOL (N/A) as a surgical intervention.  The patient's history has been reviewed, patient examined, no change in status, stable for surgery.  I have reviewed the patient's chart and labs.  Questions were answered to the patient's satisfaction.   ? ? ?[name] ? ?Patient seen and examined in short stay.  Feeling better with a 2 unit transfusion; hemoglobin 7.6 this morning.  Today, patient denies melena hematochezia or hematemesis. ?As discussed, regardless of findings today the patient will need a colonoscopy.  Patient states she wants to go home after this procedure if at all possible and will return for colonoscopy at a later date in the near future.  This may not be unreasonable. ? ?She denies dysphagia.  I have offered the patient an EGD as discussed.  Risk, benefits, limitations and alternatives have been reviewed.  Questions answered.  She is agreeable. ? ?Further recommendations to follow after EGD has been performed. ?

## 2021-11-29 NOTE — Anesthesia Postprocedure Evaluation (Signed)
Anesthesia Post Note ? ?Patient: [name] ? ?Procedure(s) Performed: ESOPHAGOGASTRODUODENOSCOPY (EGD) WITH PROPOFOL ?BIOPSY ? ?Patient location during evaluation: Phase II ?Anesthesia Type: General ?Level of consciousness: awake ?Pain management: pain level controlled ?Vital Signs Assessment: post-procedure vital signs reviewed and stable ?Respiratory status: spontaneous breathing and respiratory function stable ?Cardiovascular status: blood pressure returned to baseline and stable ?Postop Assessment: no headache and no apparent nausea or vomiting ?Anesthetic complications: no ?Comments: Late entry ? ? ?No notable events documented. ? ? ?Last Vitals:  ?Vitals:  ? 11/29/21 1200 11/29/21 1233  ?BP: 110/79 130/69  ?Pulse: (!) 56 (!) 56  ?Resp: 15 18  ?Temp:  36.4 ?C  ?SpO2: 99% 100%  ?  ?Last Pain:  ?Vitals:  ? 11/29/21 1233  ?TempSrc: Oral  ?PainSc:   ? ? ?  ?  ?  ?  ?  ?  ? ?Louann Sjogren ? ? ? ? ?

## 2021-11-29 NOTE — Anesthesia Preprocedure Evaluation (Signed)
Anesthesia Evaluation  ?Patient identified by MRN, date of birth, ID band ?Patient awake ? ? ? ?Reviewed: ?Allergy & Precautions, H&P , NPO status , Patient's Chart, lab work & pertinent test results, reviewed documented beta blocker date and time  ? ?Airway ?Mallampati: II ? ?TM Distance: >3 FB ?Neck ROM: full ? ? ? Dental ?no notable dental hx. ? ?  ?Pulmonary ?neg pulmonary ROS, former smoker,  ?  ?Pulmonary exam normal ?breath sounds clear to auscultation ? ? ? ? ? ? Cardiovascular ?Exercise Tolerance: Good ?negative cardio ROS ? ? ?Rhythm:regular Rate:Normal ? ? ?  ?Neuro/Psych ?negative neurological ROS ? negative psych ROS  ? GI/Hepatic ?negative GI ROS, Neg liver ROS,   ?Endo/Other  ?negative endocrine ROS ? Renal/GU ?negative Renal ROS  ?negative genitourinary ?  ?Musculoskeletal ? ? Abdominal ?  ?Peds ? Hematology ? ?(+) Blood dyscrasia, anemia ,   ?Anesthesia Other Findings ?1. Left ventricular ejection fraction, by estimation, is 55 to 60%. The  ?left ventricle has normal function. The left ventricle has no regional  ?wall motion abnormalities. There is moderate left ventricular hypertrophy.  ?Left ventricular diastolic  ?parameters were normal.  ??2. Right ventricular systolic function is normal. The right ventricular  ?size is normal. There is normal pulmonary artery systolic pressure.  ??3. Left atrial size was moderately dilated.  ??4. The mitral valve is abnormal. Mild mitral valve regurgitation. No  ?evidence of mitral stenosis.  ??5. The tricuspid valve is abnormal.  ??6. The aortic valve is tricuspid. There is mild calcification of the  ?aortic valve. There is mild thickening of the aortic valve. Aortic valve  ?regurgitation is not visualized. No aortic stenosis is present.  ??7. Aortic dilatation noted.  ??8. The inferior vena cava is normal in size with greater than 50%  ?respiratory variability, suggesting right atrial pressure of 3 mmHg.  ?  Reproductive/Obstetrics ?negative OB ROS ? ?  ? ? ? ? ? ? ? ? ? ? ? ? ? ?  ?  ? ? ? ? ? ? ? ? ?Anesthesia Physical ?Anesthesia Plan ? ?ASA: 3 and emergent ? ?Anesthesia Plan: General  ? ?Post-op Pain Management:   ? ?Induction:  ? ?PONV Risk Score and Plan: Propofol infusion ? ?Airway Management Planned:  ? ?Additional Equipment:  ? ?Intra-op Plan:  ? ?Post-operative Plan:  ? ?Informed Consent: I have reviewed the patients History and Physical, chart, labs and discussed the procedure including the risks, benefits and alternatives for the proposed anesthesia with the patient or authorized representative who has indicated his/her understanding and acceptance.  ? ? ? ?Dental Advisory Given ? ?Plan Discussed with: CRNA ? ?Anesthesia Plan Comments:   ? ? ? ? ? ? ?Anesthesia Quick Evaluation ? ?

## 2021-11-29 NOTE — Progress Notes (Signed)
Tele shows maintining SR after cardioversion in ER for new onset afib. Echo yesterday normal LVEF, mod LAE, mild MR. BP's have improved, will start lopressor 25mg  bid.  ? ?CHADS2Vasc score is 1. Given she was cardioverted 1 month of anticoag would be indicated but would not require longer term anticoagulation. If can be cleared by primary team and GI from anemia standpoint recommend 1 month eliquis 5mg  bid. ? ? ?No additional cardiology recs at this time, we will sign off inpatient care. We will arrange outpatient f/u.  ? ? ?Tami RichJonathan Teola Felipe MD ?

## 2021-11-29 NOTE — Anesthesia Procedure Notes (Signed)
Date/Time: 11/29/2021 11:13 AM ?Performed by: Franco NonesYates, Sha Burling S, CRNA ?Pre-anesthesia Checklist: Patient identified, Emergency Drugs available, Suction available, Timeout performed and Patient being monitored ?Patient Re-evaluated:Patient Re-evaluated prior to induction ?Oxygen Delivery Method: Nasal Cannula ? ? ? ? ?

## 2021-11-29 NOTE — Assessment & Plan Note (Signed)
--  with extensive gastric ulceration, GI team recommending discontinue all systemic steroid usage, protonix 40 mg BID  ?

## 2021-11-29 NOTE — Transfer of Care (Signed)
Immediate Anesthesia Transfer of Care Note ? ?Patient: [name] ? ?Procedure(s) Performed: ESOPHAGOGASTRODUODENOSCOPY (EGD) WITH PROPOFOL ?BIOPSY ? ?Patient Location: PACU ? ?Anesthesia Type:General ? ?Level of Consciousness: awake and patient cooperative ? ?Airway & Oxygen Therapy: Patient Spontanous Breathing ? ?Post-op Assessment: Report given to RN and Post -op Vital signs reviewed and stable ? ?Post vital signs: Reviewed and stable ? ?Last Vitals:  ?Vitals Value Taken Time  ?BP 122/67 11/29/21 1134  ?Temp    ?Pulse 63 11/29/21 1136  ?Resp 15 11/29/21 1136  ?SpO2 100 % 11/29/21 1136  ?Vitals shown include unvalidated device data. ? ?Last Pain:  ?Vitals:  ? 11/29/21 0904  ?TempSrc:   ?PainSc: 3   ?   ? ?Patients Stated Pain Goal: 2 (11/28/21 0107) ? ?Complications: No notable events documented. ?

## 2021-11-30 ENCOUNTER — Telehealth: Payer: Self-pay | Admitting: Gastroenterology

## 2021-11-30 DIAGNOSIS — K254 Chronic or unspecified gastric ulcer with hemorrhage: Secondary | ICD-10-CM | POA: Diagnosis present

## 2021-11-30 DIAGNOSIS — G8929 Other chronic pain: Secondary | ICD-10-CM | POA: Diagnosis present

## 2021-11-30 DIAGNOSIS — D500 Iron deficiency anemia secondary to blood loss (chronic): Secondary | ICD-10-CM

## 2021-11-30 DIAGNOSIS — D649 Anemia, unspecified: Secondary | ICD-10-CM | POA: Diagnosis present

## 2021-11-30 DIAGNOSIS — Z880 Allergy status to penicillin: Secondary | ICD-10-CM | POA: Diagnosis not present

## 2021-11-30 DIAGNOSIS — I480 Paroxysmal atrial fibrillation: Secondary | ICD-10-CM | POA: Diagnosis present

## 2021-11-30 DIAGNOSIS — Z791 Long term (current) use of non-steroidal anti-inflammatories (NSAID): Secondary | ICD-10-CM | POA: Diagnosis not present

## 2021-11-30 DIAGNOSIS — K311 Adult hypertrophic pyloric stenosis: Secondary | ICD-10-CM | POA: Diagnosis present

## 2021-11-30 DIAGNOSIS — E8809 Other disorders of plasma-protein metabolism, not elsewhere classified: Secondary | ICD-10-CM | POA: Diagnosis present

## 2021-11-30 DIAGNOSIS — I959 Hypotension, unspecified: Secondary | ICD-10-CM | POA: Diagnosis present

## 2021-11-30 DIAGNOSIS — R634 Abnormal weight loss: Secondary | ICD-10-CM | POA: Diagnosis present

## 2021-11-30 DIAGNOSIS — Z96653 Presence of artificial knee joint, bilateral: Secondary | ICD-10-CM | POA: Diagnosis present

## 2021-11-30 DIAGNOSIS — R079 Chest pain, unspecified: Secondary | ICD-10-CM | POA: Diagnosis present

## 2021-11-30 DIAGNOSIS — Z6823 Body mass index (BMI) 23.0-23.9, adult: Secondary | ICD-10-CM | POA: Diagnosis not present

## 2021-11-30 DIAGNOSIS — K2970 Gastritis, unspecified, without bleeding: Secondary | ICD-10-CM | POA: Diagnosis present

## 2021-11-30 DIAGNOSIS — Z7952 Long term (current) use of systemic steroids: Secondary | ICD-10-CM | POA: Diagnosis not present

## 2021-11-30 DIAGNOSIS — M1990 Unspecified osteoarthritis, unspecified site: Secondary | ICD-10-CM | POA: Diagnosis present

## 2021-11-30 DIAGNOSIS — Z79899 Other long term (current) drug therapy: Secondary | ICD-10-CM | POA: Diagnosis not present

## 2021-11-30 DIAGNOSIS — M069 Rheumatoid arthritis, unspecified: Secondary | ICD-10-CM | POA: Diagnosis present

## 2021-11-30 DIAGNOSIS — Z87891 Personal history of nicotine dependence: Secondary | ICD-10-CM | POA: Diagnosis not present

## 2021-11-30 DIAGNOSIS — M545 Low back pain, unspecified: Secondary | ICD-10-CM | POA: Diagnosis present

## 2021-11-30 DIAGNOSIS — I10 Essential (primary) hypertension: Secondary | ICD-10-CM | POA: Diagnosis present

## 2021-11-30 LAB — CBC
HCT: 29.5 % — ABNORMAL LOW (ref 36.0–46.0)
Hemoglobin: 8.6 g/dL — ABNORMAL LOW (ref 12.0–15.0)
MCH: 25 pg — ABNORMAL LOW (ref 26.0–34.0)
MCHC: 29.2 g/dL — ABNORMAL LOW (ref 30.0–36.0)
MCV: 85.8 fL (ref 80.0–100.0)
Platelets: 275 10*3/uL (ref 150–400)
RBC: 3.44 MIL/uL — ABNORMAL LOW (ref 3.87–5.11)
RDW: 17.3 % — ABNORMAL HIGH (ref 11.5–15.5)
WBC: 4.8 10*3/uL (ref 4.0–10.5)
nRBC: 0 % (ref 0.0–0.2)

## 2021-11-30 MED ORDER — DICLOFENAC SODIUM 1 % EX GEL
2.0000 g | Freq: Four times a day (QID) | CUTANEOUS | 0 refills | Status: DC | PRN
Start: 2021-11-30 — End: 2022-01-18

## 2021-11-30 MED ORDER — PANTOPRAZOLE SODIUM 40 MG PO TBEC: 40.0000 mg | DELAYED_RELEASE_TABLET | Freq: Two times a day (BID) | ORAL | 2 refills | Status: DC

## 2021-11-30 MED ORDER — APIXABAN 5 MG PO TABS: 5.0000 mg | ORAL_TABLET | Freq: Two times a day (BID) | ORAL | 0 refills | Status: DC

## 2021-11-30 MED ORDER — METOPROLOL TARTRATE 25 MG PO TABS: 25.0000 mg | ORAL_TABLET | Freq: Two times a day (BID) | ORAL | 2 refills | Status: DC

## 2021-11-30 NOTE — Plan of Care (Signed)

## 2021-11-30 NOTE — Telephone Encounter (Signed)
[name] - Can you arrange for CBC in 1 week for this patient for recent hospitalization with IDA due to upper GI bleed. ? ?[name] - Please arrange follow up for this patient in 2-3 months for follow up PUD and consult regarding first TCS.  ? ?[name], MSN, FNP-BC, AGACNP-BC ?[facility] ? ? ? ?

## 2021-11-30 NOTE — Discharge Summary (Signed)
Physician Discharge Summary  [name] [id number] DOB: [birth year] DOA: 11/27/2021 ? ?GI: [facility]  ? ?Admit date: 11/27/2021 ?Discharge date: 11/30/2021 ? ?Admitted From:  Home  ?Disposition:  Home  ? ?Recommendations for Outpatient Follow-up:  ?Follow up with GI in 1 weeks for CBC test ?Follow up with GI in 2-3 months for EGD/colonoscopy ?Please obtain CBC in one week ?Take apixaban for 1 month only then stop.  ?Bleeding precautions while on anticoagulant therapy ?Please follow up on the following pending results: biopsy results from EGD ? ?Discharge Condition: STABLE   ?CODE STATUS: FULL ?DIET: low residue   ? ?Brief Hospitalization Summary: ?Please see all hospital notes, images, labs for full details of the hospitalization. ? 51 y.o. female with medical history significant for arthritis, chronic back pain who presents to the emergency department accompanied by son due to palpitations and chest discomfort which started this evening.  Patient works third shift, so when she woke up around 6 PM, she felt palpitations on left side of her chest, on getting up from bed, she felt dizzy, so she laid back in bed and called her son who brought her to the ED for further evaluation and management.  Patient states that she has never had such palpitations before.  She denies nausea, vomiting, shortness of breath, fever, chills, abdominal pain. Patient states that she has had decreased oral intake over the last few days. ?  ?ED Course:  ?In the emergency department, patient was reported to be hypotensive in the 70s and HR between 140 and 170.  Patient was cardioverted to normal sinus rhythm with HR improving to 70s per minute, BP 99/79 and other vital signs being within normal range.  Work-up in the ED showed normocytic anemia and normal BMP except for slight elevation in BUN at 23, albumin 3.0 ?Patient was started on Eliquis, IV hydration was provided.  Hospitalist was asked to admit patient for further evaluation  and management. ? ?11/28/2021:  GI and cardiology consultation requested.  Further recommendations to follow.  ?Transfuse 2 units PRBC for Hg down to 5.5.   ? ?11/29/2021: Hg up to 7.6 after 2 units PRBC.  EGD with extensive gastric ulceration extending into the duodenal bulb ? ?11/30/2021: Pt feeling much better today.  She is eating and drinking better.  She verbalized understanding of instructions regarding protonix. Avoiding all oral/systemic NSAIDS.  Dr. Gala Marsh said she could use topical voltaren gel.   ? ? ?HOSPITAL COURSE BY PROBLEM LIST ? ?Assessment and Plan: ?* Paroxysmal atrial fibrillation with RVR ([place]) ?-- [place] cardiology consultation and recommendations ?-- Follow-up TSH WNL ?-- Follow-up 2D echocardiogram results LVEF 50-55% with LVH.  ?-- cardiology team started patient on lopressor 25 mg BID  ?-- Held anticoagulation until GI work-up is completed ?--Pt would only need to do 30 days of apixaban per Dr. Harl Marsh.  ?-- Given chronic NSAID use, suspect upper GI bleeding with possible ulceration ?-- Appreciate GI recs.  Per Dr. Gala Marsh pt can do 30 days of apixaban if patient stops all systemic NSAIDS, continue protonix 40 mg BID.   ? ?Severe anemia ?--Hg up to 8.6 after PRBC transfusion.  ? ?Nausea without vomiting ?--continue supportive measures  ? ?Loss of weight ?--GI team planning colonoscopy in 12 weeks with repeat EGD at that time  ? ?NSAID long-term use ?--with extensive gastric ulceration, GI team recommending discontinue all systemic steroid usage, protonix 40 mg BID  ? ?Iron deficiency anemia ?--from chronic blood loss ?--extensive gastric ulceration being  treated with protonix BID for 12 weeks ?--repeating EGD in [redacted] weeks along with screening colonoscopy ?--GI arranged for repeat CBC in 1 week ? ?Hypomagnesemia ?--IV replacement ordered.  Recheck in AM.  ? ?Hypoalbuminemia ?-- Stable likely secondary to poor oral intake ? ?Severe iron deficiency anemia ?-- Chronic blood loss from upper GI  bleeding related to chronic NSAID usage and subsequent extensive gastric ulceration seen on EGD.  ?--protonix 40 mg BID ordered by GI for 12 weeks.  STOP systemic NSAIDS.  ?--repeat EGD and colonoscopy in 12 weeks  ? ?Arthritis ?-- Stop all NSAIDs but Dr. Gala Marsh said could continue topical voltaren gel.  ?-- Protonix for GI protection ?-- Scheduled acetaminophen ordered ? ?Midline low back pain without sciatica ?-- Explained to patient that oral NSAIDs not recommended at this time given issues with GI bleeding ?-- Topical Voltaren gel ordered and oral Tylenol ?-- Further recommendations pending GI work-up ? ?Discharge Diagnoses:  ?Principal Problem: ?  Paroxysmal atrial fibrillation with RVR ([place]) ?Active Problems: ?  Midline low back pain without sciatica ?  Arthritis ?  Severe iron deficiency anemia ?  Hypoalbuminemia ?  Hypomagnesemia ?  Iron deficiency anemia ?  NSAID long-term use ?  Loss of weight ?  Nausea without vomiting ?  Severe anemia ? ? ?Discharge Instructions: ?Discharge Instructions   ? ? Amb Referral to AFIB Clinic   Complete by: As directed ?  ? Presented to [facility] ED in rapid afib - now in sinus after cardioversion  ? ?  ? ?Allergies as of 11/30/2021   ? ?   Reactions  ? Penicillins Nausea And Vomiting  ? ?  ? ?  ?Medication List  ?  ? ?STOP taking these medications   ? ?azithromycin 250 MG tablet ?Commonly known as: ZITHROMAX ?  ?cefdinir 300 MG capsule ?Commonly known as: OMNICEF ?  ?diclofenac 75 MG EC tablet ?Commonly known as: VOLTAREN ?  ?methocarbamol 500 MG tablet ?Commonly known as: ROBAXIN ?  ? ?  ? ?TAKE these medications   ? ?albuterol 108 (90 Base) MCG/ACT inhaler ?Commonly known as: VENTOLIN HFA ?Inhale 2 puffs into the lungs every 6 (six) hours as needed for wheezing or shortness of breath. ?  ?apixaban 5 MG Tabs tablet ?Commonly known as: ELIQUIS ?Take 1 tablet (5 mg total) by mouth 2 (two) times daily. TAKE FOR 30 DAYS ONLY. ?  ?diclofenac Sodium 1 % Gel ?Commonly known as:  Voltaren ?Apply 2 g topically 4 (four) times daily as needed (JOINT PAIN). ?  ?HYDROcodone-acetaminophen 7.5-325 MG tablet ?Commonly known as: Norco ?Take 1 tablet by mouth every 6 (six) hours as needed for moderate pain (Must last 30 days). ?  ?MELATONIN PO ?Take 1 tablet by mouth daily as needed (sleep). ?  ?metoprolol tartrate 25 MG tablet ?Commonly known as: LOPRESSOR ?Take 1 tablet (25 mg total) by mouth 2 (two) times daily. ?  ?pantoprazole 40 MG tablet ?Commonly known as: PROTONIX ?Take 1 tablet (40 mg total) by mouth 2 (two) times daily before a meal. ?  ? ?  ? ? Follow-up Information   ? ? Erma Heritage, PA-C Follow up on 12/18/2021.   ?Specialties: Physician Assistant, Cardiology ?Why: Cardiology Hospital Follow-up on 12/18/2021 at 2:30 PM. ?Contact information: ?[address] ?[place] [address] ?[phone] ? ? ?  ?  ? ? [facility]. Schedule an appointment as soon as possible for a visit in 3 month(s).   ?Why: Hospital Follow Up ?Contact information: ?  [place] ?[address] ?[phone] ? ?  ?  ? ?  ?  ? ?  ? ?Allergies  ?Allergen Reactions  ? Penicillins Nausea And Vomiting  ? ?Allergies as of 11/30/2021   ? ?   Reactions  ? Penicillins Nausea And Vomiting  ? ?  ? ?  ?Medication List  ?  ? ?STOP taking these medications   ? ?azithromycin 250 MG tablet ?Commonly known as: ZITHROMAX ?  ?cefdinir 300 MG capsule ?Commonly known as: OMNICEF ?  ?diclofenac 75 MG EC tablet ?Commonly known as: VOLTAREN ?  ?methocarbamol 500 MG tablet ?Commonly known as: ROBAXIN ?  ? ?  ? ?TAKE these medications   ? ?albuterol 108 (90 Base) MCG/ACT inhaler ?Commonly known as: VENTOLIN HFA ?Inhale 2 puffs into the lungs every 6 (six) hours as needed for wheezing or shortness of breath. ?  ?apixaban 5 MG Tabs tablet ?Commonly known as: ELIQUIS ?Take 1 tablet (5 mg total) by mouth 2 (two) times daily. TAKE FOR 30 DAYS ONLY. ?  ?diclofenac Sodium 1 % Gel ?Commonly known  as: Voltaren ?Apply 2 g topically 4 (four) times daily as needed (JOINT PAIN). ?  ?HYDROcodone-acetaminophen 7.5-325 MG tablet ?Commonly known as: Norco ?Take 1 tablet by mouth every 6 (six) hours as needed f

## 2021-11-30 NOTE — TOC Initial Note (Signed)
Transition of Care (TOC) - Initial/Assessment Note  ? ? ?Patient Details  ?Name: [name] ?MRN: [id number] ?Date of Birth: Dec 17, 1970 ? ?Transition of Care (TOC) CM/SW Contact:    ?Kaytee Taliercio D, LCSW ?Phone Number: ?11/30/2021, 12:27 PM ? ?Clinical Narrative:                 ?Patient from home. Admitted for Afib. Considered high risk for readmission. Independent at baseline, drives. No DME equipment used at this time. Does not have a PCP. Plans on initiating services with [facility].  ? ?Expected Discharge Plan: Home/Self Care ?Barriers to Discharge: Continued Medical Work up ? ? ?Patient Goals and CMS Choice ?  ?  ?  ? ?Expected Discharge Plan and Services ?Expected Discharge Plan: Home/Self Care ?  ?  ?  ?Living arrangements for the past 2 months: Single Family Home ?                ?  ?  ?  ?  ?  ?  ?  ?  ?  ?  ? ?Prior Living Arrangements/Services ?Living arrangements for the past 2 months: Single Family Home ?Lives with:: Self ?  ?       ?  ?  ?  ?  ? ?Activities of Daily Living ?Home Assistive Devices/Equipment: None ?ADL Screening (condition at time of admission) ?Patient's cognitive ability adequate to safely complete daily activities?: Yes ?Is the patient deaf or have difficulty hearing?: No ?Does the patient have difficulty seeing, even when wearing glasses/contacts?: No ?Does the patient have difficulty concentrating, remembering, or making decisions?: No ?Patient able to express need for assistance with ADLs?: Yes ?Does the patient have difficulty dressing or bathing?: No ?Independently performs ADLs?: Yes (appropriate for developmental age) ?Does the patient have difficulty walking or climbing stairs?: No ?Weakness of Legs: None ?Weakness of Arms/Hands: None ? ?Permission Sought/Granted ?  ?  ?   ?   ?   ?   ? ?Emotional Assessment ?  ?  ?  ?  ?  ?  ? ?Admission diagnosis:  Paroxysmal atrial fibrillation with RVR (HCC) [I48.0] ?Atrial fibrillation, unspecified  type (HCC) [I48.91] ?Patient Active Problem List  ? Diagnosis Date Noted  ? Arthritis 11/28/2021  ? Severe iron deficiency anemia 11/28/2021  ? Hypoalbuminemia 11/28/2021  ? Hypomagnesemia 11/28/2021  ? Iron deficiency anemia   ? NSAID long-term use   ? Loss of weight   ? Nausea without vomiting   ? Paroxysmal atrial fibrillation with RVR (HCC) 11/27/2021  ? Midline low back pain without sciatica 11/02/2015  ? ?PCP:  Pcp, No ?Pharmacy:   ?[facility] [place], [place] - [address] ?[address] [place] [address] ?Phone: [phone] Fax: [phone] ? ? ? ? ?Social Determinants of Health (SDOH) Interventions ?  ? ?Readmission Risk Interventions ?   ? View : No data to display.  ?  ?  ?  ? ? ? ?

## 2021-11-30 NOTE — Progress Notes (Signed)
Nsg Discharge Note ? ?Admit Date:  11/27/2021 ?Discharge date: 11/30/2021 ?  ?[name] to be D/C'd Home per MD order.  AVS completed.   ?Patient/caregiver able to verbalize understanding. ? ?Discharge Medication: ?Allergies as of 11/30/2021   ? ?   Reactions  ? Penicillins Nausea And Vomiting  ? ?  ? ?  ?Medication List  ?  ? ?STOP taking these medications   ? ?azithromycin 250 MG tablet ?Commonly known as: ZITHROMAX ?  ?cefdinir 300 MG capsule ?Commonly known as: OMNICEF ?  ?diclofenac 75 MG EC tablet ?Commonly known as: VOLTAREN ?  ?methocarbamol 500 MG tablet ?Commonly known as: ROBAXIN ?  ? ?  ? ?TAKE these medications   ? ?albuterol 108 (90 Base) MCG/ACT inhaler ?Commonly known as: VENTOLIN HFA ?Inhale 2 puffs into the lungs every 6 (six) hours as needed for wheezing or shortness of breath. ?  ?apixaban 5 MG Tabs tablet ?Commonly known as: ELIQUIS ?Take 1 tablet (5 mg total) by mouth 2 (two) times daily. TAKE FOR 30 DAYS ONLY. ?  ?diclofenac Sodium 1 % Gel ?Commonly known as: Voltaren ?Apply 2 g topically 4 (four) times daily as needed (JOINT PAIN). ?  ?HYDROcodone-acetaminophen 7.5-325 MG tablet ?Commonly known as: Norco ?Take 1 tablet by mouth every 6 (six) hours as needed for moderate pain (Must last 30 days). ?  ?MELATONIN PO ?Take 1 tablet by mouth daily as needed (sleep). ?  ?metoprolol tartrate 25 MG tablet ?Commonly known as: LOPRESSOR ?Take 1 tablet (25 mg total) by mouth 2 (two) times daily. ?  ?pantoprazole 40 MG tablet ?Commonly known as: PROTONIX ?Take 1 tablet (40 mg total) by mouth 2 (two) times daily before a meal. ?  ? ?  ? ? ?Discharge Assessment: ?Vitals:  ? 11/30/21 0349 11/30/21 1413  ?BP: (!) 144/70 (!) 144/80  ?Pulse: (!) 55 (!) 54  ?Resp: 18 18  ?Temp: 98.6 ?F (37 ?C) 98.5 ?F (36.9 ?C)  ?SpO2: 99% 100%  ? Skin clean, dry and intact without evidence of skin break down, no evidence of skin tears noted. ?IV catheter discontinued intact. Site without signs and symptoms of complications  - no redness or edema noted at insertion site, patient denies c/o pain - only slight tenderness at site.  Dressing with slight pressure applied. ? ?D/c Instructions-Education: ?Discharge instructions given to patient/family with verbalized understanding. ?D/c education completed with patient/family including follow up instructions, medication list, d/c activities limitations if indicated, with other d/c instructions as indicated by MD - patient able to verbalize understanding, all questions fully answered. ?Patient instructed to return to ED, call 911, or call MD for any changes in condition.  ?Patient escorted via [place], and D/C home via private auto. ? ?Kathie Rhodes, RN ?11/30/2021 3:35 PM  ?

## 2021-11-30 NOTE — Progress Notes (Signed)
? ?Gastroenterology Progress Note  ? ?Referring Provider: No ref. provider found ?Primary Care Physician:  Pcp, No ?Primary Gastroenterologist:  Dr. Gala Romney (newly assigned) ? ?Patient ID: [name]; [id number]; 1971/08/29  ? ? ?Subjective  ? ?Patient reports doing well overall. Feeling better with more energy.  Tolerating diet. Still has some early satiety however improved. Denies abdominal pain, melena or hematochezia. ? ? ?Objective  ? ?Vital signs in last 24 hours ?Temp:  [97.6 ?F (36.4 ?C)-98.6 ?F (37 ?C)] 98.6 ?F (37 ?C) (03/31 0349) ?Pulse Rate:  [55-61] 55 (03/31 0349) ?Resp:  [15-18] 18 (03/31 0349) ?BP: (110-144)/(67-79) 144/70 (03/31 0349) ?SpO2:  [98 %-100 %] 99 % (03/31 0349) ?Last BM Date : 11/26/21 ? ?Physical Exam ?General:   Alert and oriented, pleasant ?Head:  Normocephalic and atraumatic. ?Eyes:  No icterus, sclera clear. Conjuctiva pink.  ?Heart:  S1, S2 present, no murmurs noted.  ?Lungs: Clear to auscultation bilaterally, without wheezing, rales, or rhonchi.  ?Abdomen:  Bowel sounds present, soft, non-tender, non-distended. No HSM or hernias noted. No rebound or guarding. No masses appreciated  ?Msk:  Symmetrical without gross deformities. Normal posture. ?Extremities:  Without clubbing or edema. ?Neurologic:  Alert and  oriented x4;  grossly normal neurologically. ?Skin:  Warm and dry, intact without significant lesions.  ?Psych:  Alert and cooperative. Normal mood and affect. ? ?Intake/Output from previous day: ?03/30 0701 - 03/31 0700 ?In: [id number] [P.O.:440; I.V.:681] ?Out: -  ?Intake/Output this shift: ?No intake/output data recorded. ? ?Lab Results ? ?Recent Labs  ?  11/28/21 ?[id number] 11/28/21 ?[id number] 11/29/21 ?[id number] 11/30/21 ?0430  ?WBC 5.3  --  4.4 4.8  ?HGB 5.5* 5.9* 7.6* 8.6*  ?HCT 19.8* 20.8* 25.7* 29.5*  ?PLT 289  --  252 275  ? ?BMET ?Recent Labs  ?  11/27/21 ?2140 11/27/21 ?2145 11/28/21 ?[id number] 11/29/21 ?0442  ?NA 137 140 139 138  ?K 4.0 4.4 3.9 3.8  ?CL 107 106 115* 110  ?CO2 22  --  23  23  ?GLUCOSE 95 90 79 79  ?BUN 23* 25* 16 9  ?CREATININE 0.98 1.10* 0.73 0.66  ?CALCIUM 8.9  --  8.1* 8.5*  ? ?LFT ?Recent Labs  ?  11/27/21 ?2140 11/28/21 ?[id number]  ?PROT 6.9 5.6*  ?ALBUMIN 3.0* 2.5*  ?AST 23 16  ?ALT 20 15  ?ALKPHOS 123 93  ?BILITOT 0.4 0.1*  ? ?PT/INR ?No results for input(s): LABPROT, INR in the last 72 hours. ?Hepatitis Panel ?No results for input(s): HEPBSAG, HCVAB, HEPAIGM, HEPBIGM in the last 72 hours. ? ? ?Studies/Results ?ECHOCARDIOGRAM COMPLETE ? ?Result Date: 11/28/2021 ?   ECHOCARDIOGRAM REPORT   Patient Name:   LATAZIA CHAMBER Date of Exam: 11/28/2021 Medical Rec #:  [id number]        Height:       67.0 in Accession #:    [id number]       Weight:       150.0 lb Date of Birth:  1971/01/01        BSA:          1.790 m? Patient Age:    51 years         BP:           130/72 mmHg Patient Gender: F                HR:           65 bpm. Exam Location:  [facility] Procedure: 2D Echo, Cardiac Doppler and Color Doppler  Indications:    Atrial Fibrillation  History:        Patient has no prior history of Echocardiogram examinations.                 Arrythmias:Atrial Fibrillation.  Sonographer:    Wenda Low Referring Phys: [id number] [place]  1. Left ventricular ejection fraction, by estimation, is 55 to 60%. The left ventricle has normal function. The left ventricle has no regional wall motion abnormalities. There is moderate left ventricular hypertrophy. Left ventricular diastolic parameters were normal.  2. Right ventricular systolic function is normal. The right ventricular size is normal. There is normal pulmonary artery systolic pressure.  3. Left atrial size was moderately dilated.  4. The mitral valve is abnormal. Mild mitral valve regurgitation. No evidence of mitral stenosis.  5. The tricuspid valve is abnormal.  6. The aortic valve is tricuspid. There is mild calcification of the aortic valve. There is mild thickening of the aortic valve. Aortic valve regurgitation is  not visualized. No aortic stenosis is present.  7. Aortic dilatation noted.  8. The inferior vena cava is normal in size with greater than 50% respiratory variability, suggesting right atrial pressure of 3 mmHg. FINDINGS  Left Ventricle: Left ventricular ejection fraction, by estimation, is 55 to 60%. The left ventricle has normal function. The left ventricle has no regional wall motion abnormalities. The left ventricular internal cavity size was normal in size. There is  moderate left ventricular hypertrophy. Left ventricular diastolic parameters were normal. Right Ventricle: The right ventricular size is normal. No increase in right ventricular wall thickness. Right ventricular systolic function is normal. There is normal pulmonary artery systolic pressure. The tricuspid regurgitant velocity is 2.38 m/s, and  with an assumed right atrial pressure of 3 mmHg, the estimated right ventricular systolic pressure is [id number] mmHg. Left Atrium: Left atrial size was moderately dilated. Right Atrium: Right atrial size was normal in size. Pericardium: There is no evidence of pericardial effusion. Mitral Valve: The mitral valve is abnormal. There is mild thickening of the mitral valve leaflet(s). There is mild calcification of the mitral valve leaflet(s). Mild mitral annular calcification. Mild mitral valve regurgitation. No evidence of mitral valve stenosis. MV peak gradient, 4.1 mmHg. The mean mitral valve gradient is 2.0 mmHg. Tricuspid Valve: The tricuspid valve is abnormal. Tricuspid valve regurgitation is trivial. No evidence of tricuspid stenosis. Aortic Valve: The aortic valve is tricuspid. There is mild calcification of the aortic valve. There is mild thickening of the aortic valve. There is mild aortic valve annular calcification. Aortic valve regurgitation is not visualized. No aortic stenosis  is present. Aortic valve mean gradient measures 2.0 mmHg. Aortic valve peak gradient measures 4.5 mmHg. Aortic valve area, by  VTI measures 3.28 cm?. Pulmonic Valve: The pulmonic valve was not well visualized. Pulmonic valve regurgitation is not visualized. No evidence of pulmonic stenosis. Aorta: The aortic root is normal in size and structure and aortic dilatation noted. Venous: The inferior vena cava is normal in size with greater than 50% respiratory variability, suggesting right atrial pressure of 3 mmHg. IAS/Shunts: No atrial level shunt detected by color flow Doppler.  LEFT VENTRICLE PLAX 2D LVIDd:         5.10 cm     Diastology LVIDs:         3.60 cm     LV e' medial:    9.36 cm/s LV PW:         1.30 cm  LV E/e' medial:  10.2 LV IVS:        1.30 cm     LV e' lateral:   13.60 cm/s LVOT diam:     2.10 cm     LV E/e' lateral: 7.0 LV SV:         82 LV SV Index:   46 LVOT Area:     3.46 cm?  LV Volumes (MOD) LV vol d, MOD A2C: 89.9 ml LV vol d, MOD A4C: 78.8 ml LV vol s, MOD A2C: 31.1 ml LV vol s, MOD A4C: 31.2 ml LV SV MOD A2C:     58.8 ml LV SV MOD A4C:     78.8 ml LV SV MOD BP:      51.0 ml RIGHT VENTRICLE RV Basal diam:  3.90 cm RV Mid diam:    3.80 cm RV S prime:     12.60 cm/s TAPSE (M-mode): 2.0 cm LEFT ATRIUM             Index        RIGHT ATRIUM           Index LA diam:        5.10 cm 2.85 cm/m?   RA Area:     20.60 cm? LA Vol (A2C):   87.3 ml 48.78 ml/m?  RA Volume:   57.20 ml  31.96 ml/m? LA Vol (A4C):   74.2 ml 41.46 ml/m? LA Biplane Vol: 83.3 ml 46.55 ml/m?  AORTIC VALVE                    PULMONIC VALVE AV Area (Vmax):    3.40 cm?     PV Vmax:       0.79 m/s AV Area (Vmean):   3.14 cm?     PV Peak grad:  2.5 mmHg AV Area (VTI):     3.28 cm? AV Vmax:           106.00 cm/s AV Vmean:          65.800 cm/s AV VTI:            0.251 m AV Peak Grad:      4.5 mmHg AV Mean Grad:      2.0 mmHg LVOT Vmax:         104.00 cm/s LVOT Vmean:        59.600 cm/s LVOT VTI:          0.238 m LVOT/AV VTI ratio: 0.95  AORTA Ao Root diam: 3.10 cm MITRAL VALVE               TRICUSPID VALVE MV Area (PHT): 3.58 cm?    TR Peak grad:   22.7 mmHg MV  Area VTI:   2.74 cm?    TR Vmax:        238.00 cm/s MV Peak grad:  4.1 mmHg MV Mean grad:  2.0 mmHg    SHUNTS MV Vmax:       1.01 m/s    Systemic VTI:  0.24 m MV Vmean:      63.6 cm/s   Systemic Diam:

## 2021-11-30 NOTE — Assessment & Plan Note (Signed)
--  Hg up to 8.6 after PRBC transfusion.  ?

## 2021-11-30 NOTE — Discharge Instructions (Signed)
TAKE APIXABAN FOR 30 DAYS ONLY AND THEN PLEASE STOP.  ? ?PLEASE TAKE PANTOPRAZOLE TWICE DAILY FOR 12 WEEKS ? ?PLEASE AVOID ALL NSAID MEDICATIONS  ? ? ?IMPORTANT INFORMATION: PAY CLOSE ATTENTION  ? ?PHYSICIAN DISCHARGE INSTRUCTIONS ? ?Follow with Primary care provider  Pcp, No  and other consultants as instructed by your Hospitalist Physician ? ?SEEK MEDICAL CARE OR RETURN TO EMERGENCY ROOM IF SYMPTOMS COME BACK, WORSEN OR NEW PROBLEM DEVELOPS  ? ?Please note: ?You were cared for by a hospitalist during your hospital stay. Every effort will be made to forward records to your primary care provider.  You can request that your primary care provider send for your hospital records if they have not received them.  Once you are discharged, your primary care physician will handle any further medical issues. Please note that NO REFILLS for any discharge medications will be authorized once you are discharged, as it is imperative that you return to your primary care physician (or establish a relationship with a primary care physician if you do not have one) for your post hospital discharge needs so that they can reassess your need for medications and monitor your lab values. ? ?Please get a complete blood count and chemistry panel checked by your Primary MD at your next visit, and again as instructed by your Primary MD. ? ?Get Medicines reviewed and adjusted: ?Please take all your medications with you for your next visit with your Primary MD ? ?Laboratory/radiological data: ?Please request your Primary MD to go over all hospital tests and procedure/radiological results at the follow up, please ask your primary care provider to get all Hospital records sent to his/her office. ? ?In some cases, they will be blood work, cultures and biopsy results pending at the time of your discharge. Please request that your primary care provider follow up on these results. ? ?If you are diabetic, please bring your blood sugar readings with you  to your follow up appointment with primary care.   ? ?Please call and make your follow up appointments as soon as possible.   ? ?Also Note the following: ?If you experience worsening of your admission symptoms, develop shortness of breath, life threatening emergency, suicidal or homicidal thoughts you must seek medical attention immediately by calling 911 or calling your MD immediately  if symptoms less severe. ? ?You must read complete instructions/literature along with all the possible adverse reactions/side effects for all the Medicines you take and that have been prescribed to you. Take any new Medicines after you have completely understood and accpet all the possible adverse reactions/side effects.  ? ?Do not drive when taking Pain medications or sleeping medications (Benzodiazepines) ? ?Do not take more than prescribed Pain, Sleep and Anxiety Medications. It is not advisable to combine anxiety,sleep and pain medications without talking with your primary care practitioner ? ?Special Instructions: If you have smoked or chewed Tobacco  in the last 2 yrs please stop smoking, stop any regular Alcohol  and or any Recreational drug use. ? ?Wear Seat belts while driving.  Do not drive if taking any narcotic, mind altering or controlled substances or recreational drugs or alcohol.  ? ? ? ? ? ?

## 2021-12-03 ENCOUNTER — Encounter: Payer: Self-pay | Admitting: Internal Medicine

## 2021-12-03 LAB — SURGICAL PATHOLOGY

## 2021-12-03 NOTE — Telephone Encounter (Signed)
Tried to leave message, voice mail is full. Will try later.  ?

## 2021-12-04 ENCOUNTER — Encounter (HOSPITAL_COMMUNITY): Payer: Self-pay | Admitting: Internal Medicine

## 2021-12-11 ENCOUNTER — Other Ambulatory Visit: Payer: Self-pay | Admitting: *Deleted

## 2021-12-11 ENCOUNTER — Encounter: Payer: Self-pay | Admitting: *Deleted

## 2021-12-11 DIAGNOSIS — D509 Iron deficiency anemia, unspecified: Secondary | ICD-10-CM

## 2021-12-11 NOTE — Telephone Encounter (Signed)
Tried several times to reach pt. Will mail lab requisitions.   ?

## 2021-12-18 ENCOUNTER — Ambulatory Visit: Payer: BC Managed Care – PPO | Admitting: Student

## 2021-12-23 ENCOUNTER — Telehealth: Payer: Self-pay | Admitting: Orthopaedic Surgery

## 2021-12-24 MED ORDER — HYDROCODONE-ACETAMINOPHEN 7.5-325 MG PO TABS: 1.0000 | ORAL_TABLET | Freq: Four times a day (QID) | ORAL | 0 refills | Status: DC | PRN

## 2022-01-08 ENCOUNTER — Encounter: Payer: Self-pay | Admitting: Orthopaedic Surgery

## 2022-01-08 ENCOUNTER — Ambulatory Visit (INDEPENDENT_AMBULATORY_CARE_PROVIDER_SITE_OTHER): Payer: BC Managed Care – PPO | Admitting: Orthopaedic Surgery

## 2022-01-08 DIAGNOSIS — M545 Low back pain, unspecified: Secondary | ICD-10-CM

## 2022-01-08 DIAGNOSIS — G8929 Other chronic pain: Secondary | ICD-10-CM | POA: Diagnosis not present

## 2022-01-08 NOTE — Progress Notes (Signed)
Virtual Visit via Telephone Note ? ?I connected withNAME@ on 01/08/22 at 10:00 AM EDT by telephone and verified that I am speaking with the correct person using two identifiers. ? ?Location: ?Patient: at home ?Provider: office ?  ?I discussed the limitations, risks, security and privacy concerns of performing an evaluation and management service by telephone and the availability of in person appointments. I also discussed with the patient that there may be a patient responsible charge related to this service. The patient expressed understanding and agreed to proceed. ? ? ?History of Present Illness: ?She was in hospital recently with GI bleed and ulcerations.  She was told to stop all NSAIDs.  She has been trying rubs and creams that do not help. She is taking her pain medicine. I have recommended BioFreeze.  Her back is very tender still.  She is weak. ?  ?Observations/Objective: ?Per above. ? ?Assessment and Plan: ?Encounter Diagnosis  ?Name Primary?  ? Chronic midline low back pain without sciatica Yes  ? ? ? ? ?Follow Up Instructions: ?Three months. She can do virtual. ? ?I discussed the assessment and treatment plan with the patient. The patient was provided an opportunity to ask questions and all were answered. The patient agreed with the plan and demonstrated an understanding of the instructions. ?  ?The patient was advised to call back or seek an in-person evaluation if the symptoms worsen or if the condition fails to improve as anticipated. ? ?I provided 8 ? minutes of non-face-to-face time during this encounter. ? ? ?Darreld Mclean, MD ?She  ?

## 2022-01-18 ENCOUNTER — Ambulatory Visit (INDEPENDENT_AMBULATORY_CARE_PROVIDER_SITE_OTHER): Payer: BC Managed Care – PPO | Admitting: Student

## 2022-01-18 ENCOUNTER — Encounter: Payer: Self-pay | Admitting: Student

## 2022-01-18 ENCOUNTER — Other Ambulatory Visit (HOSPITAL_COMMUNITY)
Admission: RE | Admit: 2022-01-18 | Discharge: 2022-01-18 | Disposition: A | Discharge status: Home / Self Care | Payer: BC Managed Care – PPO | Source: Ambulatory Visit | Attending: Student | Admitting: Student

## 2022-01-18 VITALS — BP 110/78 | HR 57 | Ht 67.0 in | Wt 150.2 lb

## 2022-01-18 DIAGNOSIS — I480 Paroxysmal atrial fibrillation: Secondary | ICD-10-CM | POA: Diagnosis not present

## 2022-01-18 DIAGNOSIS — K922 Gastrointestinal hemorrhage, unspecified: Secondary | ICD-10-CM | POA: Diagnosis not present

## 2022-01-18 DIAGNOSIS — Z79899 Other long term (current) drug therapy: Secondary | ICD-10-CM

## 2022-01-18 DIAGNOSIS — D649 Anemia, unspecified: Secondary | ICD-10-CM | POA: Insufficient documentation

## 2022-01-18 LAB — BASIC METABOLIC PANEL
Anion gap: 7 (ref 5–15)
BUN: 14 mg/dL (ref 6–20)
CO2: 24 mmol/L (ref 22–32)
Calcium: 8.8 mg/dL — ABNORMAL LOW (ref 8.9–10.3)
Chloride: 110 mmol/L (ref 98–111)
Creatinine, Ser: 0.72 mg/dL (ref 0.44–1.00)
GFR, Estimated: >60 mL/min (ref >60)
Glucose, Bld: 84 mg/dL (ref 70–99)
Potassium: 3.7 mmol/L (ref 3.5–5.1)
Sodium: 141 mmol/L (ref 135–145)

## 2022-01-18 LAB — CBC
HCT: 30.1 % — ABNORMAL LOW (ref 36.0–46.0)
Hemoglobin: 9 g/dL — ABNORMAL LOW (ref 12.0–15.0)
MCH: 26.5 pg (ref 26.0–34.0)
MCHC: 29.9 g/dL — ABNORMAL LOW (ref 30.0–36.0)
MCV: 88.5 fL (ref 80.0–100.0)
Platelets: 364 10*3/uL (ref 150–400)
RBC: 3.4 MIL/uL — ABNORMAL LOW (ref 3.87–5.11)
RDW: 20.3 % — ABNORMAL HIGH (ref 11.5–15.5)
WBC: 6.6 10*3/uL (ref 4.0–10.5)
nRBC: 0 % (ref 0.0–0.2)

## 2022-01-18 MED ORDER — METOPROLOL TARTRATE 25 MG PO TABS: 25.0000 mg | ORAL_TABLET | Freq: Two times a day (BID) | ORAL | 11 refills | Status: DC

## 2022-01-18 MED ORDER — PANTOPRAZOLE SODIUM 40 MG PO TBEC: 40.0000 mg | DELAYED_RELEASE_TABLET | Freq: Two times a day (BID) | ORAL | 5 refills | Status: DC

## 2022-01-18 NOTE — Patient Instructions (Signed)
Medication Instructions:  ?Your physician recommends that you continue on your current medications as directed. Please refer to the Current Medication list given to you today. ? ?*If you need a refill on your cardiac medications before your next appointment, please call your pharmacy* ? ? ?Lab Work: ?Your physician recommends that you return for lab work in: Today  ? ? ?If you have labs (blood work) drawn today and your tests are completely normal, you will receive your results only by: ?MyChart Message (if you have MyChart) OR ?A paper copy in the mail ?If you have any lab test that is abnormal or we need to change your treatment, we will call you to review the results. ? ? ?Testing/Procedures: ?NONE  ? ? ?Follow-Up: ?At [facility], you and your health needs are our priority.  As part of our continuing mission to provide you with exceptional heart care, we have created designated Provider Care Teams.  These Care Teams include your primary Cardiologist (physician) and Advanced Practice Providers (APPs -  Physician Assistants and Nurse Practitioners) who all work together to provide you with the care you need, when you need it. ? ?We recommend signing up for the patient portal called "MyChart".  Sign up information is provided on this After Visit Summary.  MyChart is used to connect with patients for Virtual Visits (Telemedicine).  Patients are able to view lab/test results, encounter notes, upcoming appointments, etc.  Non-urgent messages can be sent to your provider as well.   ?To learn more about what you can do with MyChart, go to [email or web address].   ? ?Your next appointment:   ?6 month(s) ? ?The format for your next appointment:   ?In Person ? ?Provider:   ?Jonathan Branch, MD  ? ? ?Other Instructions ?Thank you for choosing [facility]! ?  ? ?Important Information About Sugar ? ? ? ? ?  ?

## 2022-01-18 NOTE — Progress Notes (Unsigned)
Cardiology Office Note    Date:  01/19/2022   ID:  [name], DOB 09-May-1971, MRN [id number]  PCP:  Pcp, No  Cardiologist: Dina RichBranch, Jonathan, MD    Chief Complaint  Patient presents with   Hospitalization Follow-up    History of Present Illness:    [name] is a 51 y.o. female with past medical history of arthritis and chronic back pain who presents to the office today for overdue hospital follow-up.  She was last examined by the cardiology service during an admission in 10/2021 for evaluation of palpitations and was found to be in atrial fibrillation with RVR upon admission. Given associated hypotension in the ED, she underwent emergent cardioversion and it was recommended to complete 1 month of anticoagulation and she would not require long-term anticoagulation. She was started on Lopressor 25 mg twice daily. She was also found to be anemic with hemoglobin down to 5.5 and required 2 units PRBC's. She underwent EGD which showed extensive gastric ulceration extending into the duodenal bulb. GI did clear her to be on anticoagulation for 30 days if she would stop all systemic NSAIDs and continue Protonix 40 mg twice daily.  In talking with the patient and her son today, she reports overall doing well since her recent hospitalization. She did quit using NSAIDs as she had previously been on these for arthritis and denies any recent melena, hematochezia or hematuria. She continues to work full-time at [employer] and says that her breathing has been stable at work and when doing routine activities at home. No recent chest pain, palpitations, orthopnea, PND or pitting edema.  She did quit Eliquis after the 1 month course as prescribed during her hospitalization.   Past Medical History:  Diagnosis Date   Anemia    Arthritis    Chronic back pain    PAF (paroxysmal atrial fibrillation) (HCC)    a. s/p DCCV in 10/2021    Past Surgical History:  Procedure Laterality Date   BIOPSY   11/29/2021   Procedure: BIOPSY;  Surgeon: Corbin Adeourk, Robert M, MD;  Location: [facility];  Service: Endoscopy;;   CHOLECYSTECTOMY     ESOPHAGOGASTRODUODENOSCOPY (EGD) WITH PROPOFOL N/A 11/29/2021   Procedure: ESOPHAGOGASTRODUODENOSCOPY (EGD) WITH PROPOFOL;  Surgeon: Corbin Adeourk, Robert M, MD;  Location: [facility];  Service: Endoscopy;  Laterality: N/A;   REPLACEMENT TOTAL KNEE BILATERAL     TUBAL LIGATION      Current Medications: Outpatient Medications Prior to Visit  Medication Sig Dispense Refill   albuterol (VENTOLIN HFA) 108 (90 Base) MCG/ACT inhaler Inhale 2 puffs into the lungs every 6 (six) hours as needed for wheezing or shortness of breath.     HYDROcodone-acetaminophen (NORCO) 7.5-325 MG tablet Take 1 tablet by mouth every 6 (six) hours as needed for moderate pain (Must last 30 days). 38 tablet 0   MELATONIN PO Take 1 tablet by mouth daily as needed (sleep).     Menthol, Topical Analgesic, (BIOFREEZE ROLL-ON EX) Apply topically.     metoprolol tartrate (LOPRESSOR) 25 MG tablet Take 1 tablet (25 mg total) by mouth 2 (two) times daily. 60 tablet 2   pantoprazole (PROTONIX) 40 MG tablet Take 1 tablet (40 mg total) by mouth 2 (two) times daily before a meal. 60 tablet 2   apixaban (ELIQUIS) 5 MG TABS tablet Take 1 tablet (5 mg total) by mouth 2 (two) times daily. TAKE FOR 30 DAYS ONLY. (Patient not taking: Reported on 01/18/2022) 60 tablet 0   diclofenac  Sodium (VOLTAREN) 1 % GEL Apply 2 g topically 4 (four) times daily as needed (JOINT PAIN). (Patient not taking: Reported on 01/18/2022) 100 g 0   No facility-administered medications prior to visit.     Allergies:   Penicillins   Social History   Socioeconomic History   Marital status: Legally Separated    Spouse name: Not on file   Number of children: Not on file   Years of education: Not on file   Highest education level: Not on file  Occupational History   Not on file  Tobacco Use   Smoking status: Former    Packs/day: 0.50     Years: 20.00    Pack years: 10.00    Types: Cigarettes   Smokeless tobacco: Never  Vaping Use   Vaping Use: Former  Substance and Sexual Activity   Alcohol use: No   Drug use: No   Sexual activity: Yes    Birth control/protection: None, Surgical  Other Topics Concern   Not on file  Social History Narrative   Not on file   Social Determinants of Health   Financial Resource Strain: Not on file  Food Insecurity: Not on file  Transportation Needs: Not on file  Physical Activity: Not on file  Stress: Not on file  Social Connections: Not on file     Family History:  The patient's Family history is unknown by patient.   Review of Systems:    Please see the history of present illness.     All other systems reviewed and are otherwise negative except as noted above.   Physical Exam:    VS:  BP 110/78   Pulse (!) 57   Ht  (1.702 m)   Wt 150 lb 3.2 oz (68.1 kg)   LMP 09/16/2014 Comment: NEG U PREG 1/312/16  SpO2 97%   BMI 23.52 kg/m    General: Well developed, well nourished,female appearing in no acute distress. Head: Normocephalic, atraumatic. Neck: No carotid bruits. JVD not elevated.  Lungs: Respirations regular and unlabored, without wheezes or rales.  Heart: Regular rate and rhythm. No S3 or S4.  No murmur, no rubs, or gallops appreciated. Abdomen: Appears non-distended. No obvious abdominal masses. Msk:  Strength and tone appear normal for age. No obvious joint deformities or effusions. Extremities: No clubbing or cyanosis. No pitting edema.  Distal pedal pulses are 2+ bilaterally. Neuro: Alert and oriented X 3. Moves all extremities spontaneously. No focal deficits noted. Psych:  Responds to questions appropriately with a normal affect. Skin: No rashes or lesions noted. Skin appears pale.   Wt Readings from Last 3 Encounters:  01/18/22 150 lb 3.2 oz (68.1 kg)  11/27/21 150 lb (68 kg)  10/24/21 149 lb 14.6 oz (68 kg)     Studies/Labs Reviewed:    EKG:  EKG is not ordered today.   Recent Labs: 11/28/2021: ALT 15 11/29/2021: Magnesium 2.0; TSH 2.459 01/18/2022: BUN 14; Creatinine, Ser 0.72; Hemoglobin 9.0; Platelets 364; Potassium 3.7; Sodium 141   Lipid Panel No results found for: CHOL, TRIG, HDL, CHOLHDL, VLDL, LDLCALC, LDLDIRECT  Additional studies/ records that were reviewed today include:   Echocardiogram: 10/2021 IMPRESSIONS     1. Left ventricular ejection fraction, by estimation, is 55 to 60%. The  left ventricle has normal function. The left ventricle has no regional  wall motion abnormalities. There is moderate left ventricular hypertrophy.  Left ventricular diastolic  parameters were normal.   2. Right ventricular systolic function is normal. The  right ventricular  size is normal. There is normal pulmonary artery systolic pressure.   3. Left atrial size was moderately dilated.   4. The mitral valve is abnormal. Mild mitral valve regurgitation. No  evidence of mitral stenosis.   5. The tricuspid valve is abnormal.   6. The aortic valve is tricuspid. There is mild calcification of the  aortic valve. There is mild thickening of the aortic valve. Aortic valve  regurgitation is not visualized. No aortic stenosis is present.   7. Aortic dilatation noted.   8. The inferior vena cava is normal in size with greater than 50%  respiratory variability, suggesting right atrial pressure of 3 mmHg.   Assessment:    1. Paroxysmal atrial fibrillation with RVR (HCC)   2. Upper GI bleeding   3. Anemia, unspecified type   4. Medication management      Plan:   In order of problems listed above:  1. Persistent Atrial Fibrillation - This was a new diagnosis for the patient in the setting of anemia and GIB and she underwent emergent DCCV in the ED due to associated hypotension. She maintained NSR during the rest of her admission and denies any recurrent chest pain or palpitations since. Will continue Lopressor  BID for  now.  - Given her CHA2DS2-VASc Score of 1, she only received 1 month of anticoagulation following DCCV and is no longer on Eliquis.   2. Anemia/GIB - Her Hgb declined to 5.5 during her recent admission and she required transfusions and EGD showed extensive gastric ulceration felt to be secondary to NSAID use. She is no longer on NSAIDS and has been taking Protonix  BID.  - Will recheck CBC and BMET today given no recent labs. I did recommend she schedule follow-up with GI as it was previously mentioned in their notes she would need a repeat EGD in 3 months.    Medication Adjustments/Labs and Tests Ordered: Current medicines are reviewed at length with the patient today.  Concerns regarding medicines are outlined above.  Medication changes, Labs and Tests ordered today are listed in the Patient Instructions below. Patient Instructions  Medication Instructions:  Your physician recommends that you continue on your current medications as directed. Please refer to the Current Medication list given to you today.  *If you need a refill on your cardiac medications before your next appointment, please call your pharmacy*   Lab Work: Your physician recommends that you return for lab work in: Today   If you have labs (blood work) drawn today and your tests are completely normal, you will receive your results only by: MyChart Message (if you have MyChart) OR A paper copy in the mail If you have any lab test that is abnormal or we need to change your treatment, we will call you to review the results.   Testing/Procedures: NONE    Follow-Up: At [facility], you and your health needs are our priority.  As part of our continuing mission to provide you with exceptional heart care, we have created designated Provider Care Teams.  These Care Teams include your primary Cardiologist (physician) and Advanced Practice Providers (APPs -  Physician Assistants and Nurse Practitioners) who all work  together to provide you with the care you need, when you need it.  We recommend signing up for the patient portal called "MyChart".  Sign up information is provided on this After Visit Summary.  MyChart is used to connect with patients for Virtual Visits (Telemedicine).  Patients are  able to view lab/test results, encounter notes, upcoming appointments, etc.  Non-urgent messages can be sent to your provider as well.   To learn more about what you can do with MyChart, go to [email or web address].    Your next appointment:   6 month(s)  The format for your next appointment:   In Person  Provider:   Dina Rich, MD    Other Instructions Thank you for choosing [facility]!    Important Information About Sugar         Signed, Ellsworth Lennox, PA-C  01/19/2022 8:45 AM     [facility] [address] [place], [place] [address] Phone: [phone] Fax: [phone]

## 2022-01-19 ENCOUNTER — Encounter: Payer: Self-pay | Admitting: Student

## 2022-01-20 ENCOUNTER — Other Ambulatory Visit: Payer: Self-pay | Admitting: Orthopaedic Surgery

## 2022-01-21 MED ORDER — HYDROCODONE-ACETAMINOPHEN 7.5-325 MG PO TABS: 1.0000 | ORAL_TABLET | Freq: Four times a day (QID) | ORAL | 0 refills | Status: DC | PRN

## 2022-02-21 ENCOUNTER — Other Ambulatory Visit: Payer: Self-pay | Admitting: Orthopedic Surgery

## 2022-02-22 MED ORDER — HYDROCODONE-ACETAMINOPHEN 7.5-325 MG PO TABS: 1.0000 | ORAL_TABLET | Freq: Four times a day (QID) | ORAL | 0 refills | Status: DC | PRN

## 2022-02-26 ENCOUNTER — Encounter: Payer: Self-pay | Admitting: Internal Medicine

## 2022-03-18 ENCOUNTER — Telehealth: Payer: Self-pay | Admitting: Orthopedic Surgery

## 2022-03-18 MED ORDER — HYDROCODONE-ACETAMINOPHEN 7.5-325 MG PO TABS: 1.0000 | ORAL_TABLET | Freq: Four times a day (QID) | ORAL | 0 refills | Status: DC | PRN

## 2022-03-31 ENCOUNTER — Other Ambulatory Visit: Payer: Self-pay

## 2022-03-31 ENCOUNTER — Emergency Department (HOSPITAL_COMMUNITY)
Admission: EM | Admit: 2022-03-31 | Discharge: 2022-04-01 | Disposition: A | Discharge status: Home / Self Care | Payer: BC Managed Care – PPO | Attending: Emergency Medicine | Admitting: Emergency Medicine

## 2022-03-31 ENCOUNTER — Encounter (HOSPITAL_COMMUNITY): Payer: Self-pay

## 2022-03-31 DIAGNOSIS — R5383 Other fatigue: Secondary | ICD-10-CM | POA: Insufficient documentation

## 2022-03-31 DIAGNOSIS — R0602 Shortness of breath: Secondary | ICD-10-CM | POA: Insufficient documentation

## 2022-03-31 DIAGNOSIS — R531 Weakness: Secondary | ICD-10-CM | POA: Diagnosis not present

## 2022-03-31 DIAGNOSIS — R519 Headache, unspecified: Secondary | ICD-10-CM | POA: Insufficient documentation

## 2022-03-31 LAB — CBG MONITORING, ED: Glucose-Capillary: 106 mg/dL — ABNORMAL HIGH (ref 70–99)

## 2022-03-31 NOTE — ED Triage Notes (Signed)
Pt presents with weakness x few days. Pt also endorses some SOB, dizziness, headache. Denies fevers. States that she is just not feeling herself. Pt is a new onset Afib pt but denies any chest pain or heart palpitations.

## 2022-04-01 LAB — COMPREHENSIVE METABOLIC PANEL
ALT: 11 U/L (ref 0–44)
AST: 12 U/L — ABNORMAL LOW (ref 15–41)
Albumin: 2.6 g/dL — ABNORMAL LOW (ref 3.5–5.0)
Alkaline Phosphatase: 110 U/L (ref 38–126)
Anion gap: 7 (ref 5–15)
BUN: 20 mg/dL (ref 6–20)
CO2: 22 mmol/L (ref 22–32)
Calcium: 8.8 mg/dL — ABNORMAL LOW (ref 8.9–10.3)
Chloride: 109 mmol/L (ref 98–111)
Creatinine, Ser: 0.81 mg/dL (ref 0.44–1.00)
GFR, Estimated: >60 mL/min (ref >60)
Glucose, Bld: 101 mg/dL — ABNORMAL HIGH (ref 70–99)
Potassium: 3.7 mmol/L (ref 3.5–5.1)
Sodium: 138 mmol/L (ref 135–145)
Total Bilirubin: 0.4 mg/dL (ref 0.3–1.2)
Total Protein: 6.8 g/dL (ref 6.5–8.1)

## 2022-04-01 LAB — CBC WITH DIFFERENTIAL/PLATELET
Abs Immature Granulocytes: 0.02 10*3/uL (ref 0.00–0.07)
Basophils Absolute: 0 10*3/uL (ref 0.0–0.1)
Basophils Relative: 1 %
Eosinophils Absolute: 0.1 10*3/uL (ref 0.0–0.5)
Eosinophils Relative: 2 %
HCT: 31.1 % — ABNORMAL LOW (ref 36.0–46.0)
Hemoglobin: 9.4 g/dL — ABNORMAL LOW (ref 12.0–15.0)
Immature Granulocytes: 0 %
Lymphocytes Relative: 20 %
Lymphs Abs: 1.3 10*3/uL (ref 0.7–4.0)
MCH: 27.5 pg (ref 26.0–34.0)
MCHC: 30.2 g/dL (ref 30.0–36.0)
MCV: 90.9 fL (ref 80.0–100.0)
Monocytes Absolute: 0.6 10*3/uL (ref 0.1–1.0)
Monocytes Relative: 9 %
Neutro Abs: 4.5 10*3/uL (ref 1.7–7.7)
Neutrophils Relative %: 68 %
Platelets: 380 10*3/uL (ref 150–400)
RBC: 3.42 MIL/uL — ABNORMAL LOW (ref 3.87–5.11)
RDW: 17.5 % — ABNORMAL HIGH (ref 11.5–15.5)
WBC: 6.6 10*3/uL (ref 4.0–10.5)
nRBC: 0 % (ref 0.0–0.2)

## 2022-04-01 LAB — TYPE AND SCREEN
ABO/RH(D): A POS
Antibody Screen: NEGATIVE

## 2022-04-01 NOTE — ED Notes (Signed)
pt reports generalized weakness and some sob with activity- pt says she is supposed to be taking iron but does not

## 2022-04-01 NOTE — ED Provider Notes (Signed)
[facility] EMERGENCY DEPARTMENT Provider Note   CSN: [id number] Arrival date & time: 03/31/22  2238     History  Chief Complaint  Patient presents with   Weakness    [name] is a 51 y.o. female.  Patient is a 51 year old female with past medical history of peptic ulcer disease, iron deficiency anemia, arthritis.  Patient hospitalized in March 2023 for anemia and bleeding ulcer.  She received blood transfusions and was started on iron and acid suppressants.  She presents today with complaints of feeling weak, short of breath, and is having headache.  This is how she felt with her prior admission.  She denies any abdominal pain, fevers, or chills.  She denies any black or bloody stool or vomit.  The history is provided by the patient.       Home Medications Prior to Admission medications   Medication Sig Start Date End Date Taking? Authorizing Provider  HYDROcodone-acetaminophen (NORCO) 7.5-325 MG tablet Take 1 tablet by mouth every 6 (six) hours as needed for moderate pain (Must last 30 days). 03/18/22   Darreld Mclean, MD  albuterol (VENTOLIN HFA) 108 (90 Base) MCG/ACT inhaler Inhale 2 puffs into the lungs every 6 (six) hours as needed for wheezing or shortness of breath. 08/06/21   [provider]  MELATONIN PO Take 1 tablet by mouth daily as needed (sleep).    [provider]  Menthol, Topical Analgesic, (BIOFREEZE ROLL-ON EX) Apply topically.    [provider]  metoprolol tartrate (LOPRESSOR) 25 MG tablet Take 1 tablet (25 mg total) by mouth 2 (two) times daily. 01/18/22   Strader, Lennart Pall, PA-C  pantoprazole (PROTONIX) 40 MG tablet Take 1 tablet (40 mg total) by mouth 2 (two) times daily before a meal. 01/18/22 01/13/23  Strader, Lennart Pall, PA-C      Allergies    Penicillins    Review of Systems   Review of Systems  All other systems reviewed and are negative.   Physical Exam Updated Vital Signs BP (!) 100/59   Pulse 72   Temp  98.1 F (36.7 C) (Oral)   Resp 20   Ht 5\' 7"  (1.702 m)   Wt 64.4 kg   LMP 09/16/2014 Comment: NEG U PREG 1/312/16  SpO2 100%   BMI 22.24 kg/m  Physical Exam Vitals and nursing note reviewed.  Constitutional:      General: She is not in acute distress.    Appearance: She is well-developed. She is not diaphoretic.  HENT:     Head: Normocephalic and atraumatic.  Cardiovascular:     Rate and Rhythm: Normal rate and regular rhythm.     Heart sounds: No murmur heard.    No friction rub. No gallop.  Pulmonary:     Effort: Pulmonary effort is normal. No respiratory distress.     Breath sounds: Normal breath sounds. No wheezing.  Abdominal:     General: Bowel sounds are normal. There is no distension.     Palpations: Abdomen is soft.     Tenderness: There is no abdominal tenderness.  Musculoskeletal:        General: Normal range of motion.     Cervical back: Normal range of motion and neck supple.  Skin:    General: Skin is warm and dry.  Neurological:     General: No focal deficit present.     Mental Status: She is alert and oriented to person, place, and time.     ED Results /  Procedures / Treatments   Labs (all labs ordered are listed, but only abnormal results are displayed) Labs Reviewed  CBG MONITORING, ED - Abnormal; Notable for the following components:      Result Value   Glucose-Capillary 106 (*)    All other components within normal limits  CBC WITH DIFFERENTIAL/PLATELET  COMPREHENSIVE METABOLIC PANEL  TYPE AND SCREEN    EKG None  Radiology No results found.  Procedures Procedures    Medications Ordered in ED Medications - No data to display  ED Course/ Medical Decision Making/ A&P  This patient presents to the ED for concern of weakness and fatigue, this involves an extensive number of treatment options, and is a complaint that carries with it a high risk of complications and morbidity.  The differential diagnosis includes symptomatic anemia, viral  syndrome, hypoglycemia, electrolyte abnormality   Co morbidities that complicate the patient evaluation  History of peptic ulcers   Additional history obtained:  No additional history or external records needed   Lab Tests:  I Ordered, and personally interpreted labs.  The pertinent results include: CBC showing hemoglobin of 9.4 but is otherwise unremarkable.  Metabolic panel unremarkable.   Imaging Studies ordered:  None performed   Cardiac Monitoring: / EKG:  None performed   Consultations Obtained:  No consultations needed   Problem List / ED Course / Critical interventions / Medication management  Patient presenting with weakness and fatigue and concerns that she may be anemic.  Her laboratory studies are showing hemoglobin of 9.4 but are otherwise unremarkable.  She appears well-hydrated and in no distress.  I am uncertain as to the exact etiology of her symptoms, but nothing appears emergent.  I feel as though discharge with outpatient follow-up is appropriate. No medications ordered I have reviewed the patients home medicines and have made adjustments as needed   Social Determinants of Health:  None   Test / Admission - Considered:  No admittable diagnosis found.  Final Clinical Impression(s) / ED Diagnoses Final diagnoses:  None    Rx / DC Orders ED Discharge Orders     None         Geoffery Lyons, MD 04/01/22 [phone]

## 2022-04-03 NOTE — Progress Notes (Signed)
Primary Care Physician:  Pcp, No  Primary GI: Dr. Jena Gauss  Patient Location: Home   Provider Location: [facility] office   Reason for Visit: Hospital follow-up   Persons present on the virtual encounter, with roles: Ermalinda Memos, PA-C (Provider), [name] (patient)   Total time (minutes) spent on medical discussion: 13 minutes  Virtual Visit via video note Due to COVID-19, visit is conducted virtually and was requested by patient.   I connected with [name] on 04/05/22 at  9:00 AM EDT by video and verified that I am speaking with the correct person using two identifiers.   I discussed the limitations, risks, security and privacy concerns of performing an evaluation and management service by video and the availability of in person appointments. I also discussed with the patient that there may be a patient responsible charge related to this service. The patient expressed understanding and agreed to proceed.  Chief Complaint  Patient presents with   Follow-up    Diarrhea for a month.Having a couple watery stools a day.  Having problems eating and losing weight. Feels weak all the time. Concerned with hemoglobin.       History of Present Illness: 51 y.o. year old female with medical history of anemia, arthritis, chronic back pain, presenting today for hospital follow-up of upper GI bleed.  She was hospitalized in March 2023 after presenting with chest pain and palpitations, found to have A-fib with RVR.  She was cardioverted and started on Eliquis.  Her hemoglobin was 7.4 on admission and dropped to 5.5 after starting Eliquis.  She denied BRBPR, melena.  Admitted to postprandial nausea at x3-4 weeks, but no other significant GI symptoms.  Reported weight loss of about 30 pounds over 6 months.  She was taking 2 Aleve per day x2-3 years as well as diclofenac twice daily.  She received 2 units PRBCs.  Underwent EGD 11/29/2021 revealing normal esophagus, extensive gastric  ulceration extending into the duodenal bulb with secondary pyloric stenosis and retained gastric contents.  Pyloric channel dilated nicely with scope passage only.  Distal bulb and second portion of the duodenum appeared normal.  Gastric mucosa was biopsied and negative for H. pylori, malignancy, dysplasia.  Due to retained food contents, entire gastric mucosa was not visualized. Recommended repeating an EGD in 12 weeks.  She also need first-ever screening colonoscopy.  Hemoglobin on day of discharge (3/31) was 8.6.  Notably, she was found to be iron deficient during admission with ferritin 8, iron 13, saturation 5%.  Hemoglobin 9.0 on 01/18/2022. Hemoglobin 9.4 on 03/31/2022.  Today:  No brbpr or melena.  She has no longer taking Eliquis.  States this is only for 1 month.  Taking pantoprazole BID and iron.  Having trouble eating. Down about 7-10 more lbs since hospitalization. Feels nauseated after eating. No vomiting. Getting full quickly. Doesn't want to eating.  No heartburn or dysphagia. Softer/liquid items are better for her.  Unable to tolerate solid foods.  Feels weak and has no energy.   For the last month, she has had diarrhea. 1-2 times daily with taking Imodium. Without imodium, she has 3-4 Bms daily.  Stools are like water. Has woken to have a BM. Thinks she was on antibiotics in December/January, for pneumonia, but none since. No well water. No sick contacts. Prior to diarrhea, she would have 1 normal BM daily. No food triggers.   No Fhx of colon cancer or IBD.   NSAIDs:  None.  Past Medical History:  Diagnosis Date   Anemia    Arthritis    Chronic back pain    PAF (paroxysmal atrial fibrillation) (HCC)    a. s/p DCCV in 10/2021     Past Surgical History:  Procedure Laterality Date   BIOPSY  11/29/2021   Procedure: BIOPSY;  Surgeon: Corbin Ade, MD;  [facility];  Service: Endoscopy;;   CHOLECYSTECTOMY     ESOPHAGOGASTRODUODENOSCOPY (EGD) WITH PROPOFOL  N/A 11/29/2021   Surgeon: Corbin Ade, MD; extensive gastric ulceration extending into the duodenal bulb with secondary pyloric stenosis and retained gastric contents.  Pyloric channel dilated nicely with scope passage only. Gastric mucosa was biopsied and negative for H. pylori, malignancy, dysplasia.   REPLACEMENT TOTAL KNEE BILATERAL     TUBAL LIGATION       Current Meds  Medication Sig   albuterol (VENTOLIN HFA) 108 (90 Base) MCG/ACT inhaler Inhale 2 puffs into the lungs every 6 (six) hours as needed for wheezing or shortness of breath.   ferrous sulfate 324 MG TBEC Take 324 mg by mouth.   HYDROcodone-acetaminophen (NORCO) 7.5-325 MG tablet Take 1 tablet by mouth every 6 (six) hours as needed for moderate pain (Must last 30 days).   MELATONIN PO Take 1 tablet by mouth daily as needed (sleep).   Menthol, Topical Analgesic, (BIOFREEZE ROLL-ON EX) Apply topically.   metoprolol tartrate (LOPRESSOR) 25 MG tablet Take 1 tablet (25 mg total) by mouth 2 (two) times daily.   ondansetron (ZOFRAN) 4 MG tablet Take 1 tablet (4 mg total) by mouth every 8 (eight) hours as needed for nausea or vomiting.   pantoprazole (PROTONIX) 40 MG tablet Take 1 tablet (40 mg total) by mouth 2 (two) times daily before a meal.     Family History  Problem Relation Age of Onset   Colon cancer Neg Hx    Inflammatory bowel disease Neg Hx     Social History   Socioeconomic History   Marital status: Legally Separated    Spouse name: Not on file   Number of children: Not on file   Years of education: Not on file   Highest education level: Not on file  Occupational History   Not on file  Tobacco Use   Smoking status: Former    Packs/day: 0.50    Years: 20.00    Total pack years: 10.00    Types: Cigarettes   Smokeless tobacco: Never  Vaping Use   Vaping Use: Former  Substance and Sexual Activity   Alcohol use: No   Drug use: No   Sexual activity: Yes    Birth control/protection: None, Surgical   Other Topics Concern   Not on file  Social History Narrative   Not on file   Social Determinants of Health   Financial Resource Strain: Not on file  Food Insecurity: Not on file  Transportation Needs: Not on file  Physical Activity: Not on file  Stress: Not on file  Social Connections: Not on file       Review of Systems: Gen: Denies fever, chills, cold or flulike symptom. CV: Denies chest pain, palpitations. Resp: Denies dyspnea at rest, cough. GI: see HPI Heme: See HPI  Observations/Objective: No distress. Alert and oriented. Pleasant. Well nourished. Normal mood and affect. Unable to perform complete physical exam due to video encounter.    Assessment:  51 year old female with medical history significant for anemia, arthritis, chronic back pain, presenting today for hospital follow-up of upper  GI bleed.  She was hospitalized in March 2023 with atrial fibrillation with RVR.  She was cardioverted and started on Eliquis.  Her hemoglobin declined from 7.4 on day of admission to 5.5 after starting Eliquis.  Also found to have iron deficiency.  Admitted to postprandial nausea and chronic NSAID use.  EGD on 11/29/2021 revealed extensive gastric ulceration extending into the duodenal bulb with secondary pyloric stenosis and retained gastric contents.  Pyloric channel dilated nicely with scope passage only.  Gastric mucosa was biopsied and negative for H. pylori, dysplasia, or malignancy.  Recommended repeat EGD in 12 weeks.  Also with a first-ever screening colonoscopy.   IDA: Then have iron deficiency anemia during hospitalization in March 2023.  Hemoglobin 7.4 on admission, down to 5.5 after starting Eliquis for A fib s/p 2 units PRBCs with hemoglobin of 8.6 on day of discharge.  Suspect IDA is likely secondary to extensive gastric ulceration noted on EGD during admission in the setting of chronic NSAID use.  However, she has never had a colonoscopy.  She has been on pantoprazole 40 mg  twice daily as well as iron daily since her hospitalization. Reports Eliquis was discontinued after 1 month. She has also discontinued all NSAIDs.  Denies overt GI bleeding.  Most recent hemoglobin on 7/30 improved to 9.4. She needs repeat EGD to follow-up on extensive gastric ulceration and also needs first-ever colonoscopy.  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  Biopsies negative for H. pylori or malignancy. Likely secondary to chronic NSAID use which she has discontinued. She is on PPI twice daily since EGD and is currently due for surveillance EGD.   Nausea without vomiting/weight loss: Likely secondary to extensive gastric ulceration with secondary pyloric stenosis.  She has been on PPI twice daily since the time of her EGD, but continues with postprandial nausea and weight loss as she is only able to tolerate soft foods/liquids.  In general, she has a poor appetite and gets full quickly.  She is lost about 40 pounds over the last year, about 10 pounds in the last couple of months.  She is due for surveillance EGD to reevaluate extensive gastric ulceration, and likely needs dilation of the pyloric channel.   Diarrhea: New onset diarrhea x1 month with 3-4 watery bowel movements daily.  Also wakes from sleep to have a bowel movement.  Denies BRBPR, melena, abdominal pain.  Last course of antibiotics was more than 6 months ago.  She was hospitalized in March.  Denies well water or sick contacts.  I have recommended stool studies to rule out infectious etiology, updating thyroid function, screen for celiac disease, and we will also proceed with first-ever colonoscopy which will help to further investigate her diarrhea should laboratory and stool work-up come back negative.    Plan: C. difficile GDH and toxin A/B, GI pathogen panel, Giardia, Cryptosporidium, TSH, IgA, TTG IgA, iron panel. Proceed with upper  endoscopy + colonoscopy with propofol by Dr. Jena Gauss in near future. The risks, benefits, and alternatives have been discussed with the patient in detail. The patient states understanding and desires to proceed. ASA 3 Hold iron x7 days prior to procedure. Continue pantoprazole 40 mg twice daily. Continue iron daily. Continue to avoid NSAIDs. Use Zofran 4 mg every 8 hours as needed for nausea/vomiting. Continue to follow a soft general liquid diet. Recommend drinking at least 2 protein shakes daily such as Ensure, boost, etc. She will let  me know of any worsening symptoms before her procedures. Follow-up after procedures.     I discussed the assessment and treatment plan with the patient. The patient was provided an opportunity to ask questions and all were answered. The patient agreed with the plan and demonstrated an understanding of the instructions.   The patient was advised to call back or seek an in-person evaluation if the symptoms worsen or if the condition fails to improve as anticipated.  I provided 13 minutes of video-face-to-face time during this encounter.  Ermalinda Memos, PA-C [facility]  04/05/2022

## 2022-04-05 ENCOUNTER — Telehealth: Payer: Self-pay | Admitting: Gastroenterology

## 2022-04-05 ENCOUNTER — Encounter: Payer: Self-pay | Admitting: Gastroenterology

## 2022-04-05 ENCOUNTER — Telehealth (INDEPENDENT_AMBULATORY_CARE_PROVIDER_SITE_OTHER): Payer: BC Managed Care – PPO | Admitting: Gastroenterology

## 2022-04-05 ENCOUNTER — Telehealth: Payer: Self-pay | Admitting: *Deleted

## 2022-04-05 VITALS — Ht 67.0 in | Wt 140.0 lb

## 2022-04-05 DIAGNOSIS — R197 Diarrhea, unspecified: Secondary | ICD-10-CM | POA: Insufficient documentation

## 2022-04-05 DIAGNOSIS — R110 Nausea: Secondary | ICD-10-CM

## 2022-04-05 DIAGNOSIS — D509 Iron deficiency anemia, unspecified: Secondary | ICD-10-CM

## 2022-04-05 DIAGNOSIS — K259 Gastric ulcer, unspecified as acute or chronic, without hemorrhage or perforation: Secondary | ICD-10-CM

## 2022-04-05 DIAGNOSIS — R634 Abnormal weight loss: Secondary | ICD-10-CM

## 2022-04-05 MED ORDER — ONDANSETRON HCL 4 MG PO TABS: 4.0000 mg | ORAL_TABLET | Freq: Three times a day (TID) | ORAL | 1 refills | Status: DC | PRN

## 2022-04-05 NOTE — Telephone Encounter (Signed)
[name], you are scheduled for a virtual visit with your provider today.  Just as we do with appointments in the office, we must obtain your consent to participate.  Your consent will be active for this visit and any virtual visit you may have with one of our providers in the next 365 days.  If you have a MyChart account, I can also send a copy of this consent to you electronically.  All virtual visits are billed to your insurance company just like a traditional visit in the office.  As this is a virtual visit, video technology does not allow for your provider to perform a traditional examination.  This may limit your provider's ability to fully assess your condition.  If your provider identifies any concerns that need to be evaluated in person or the need to arrange testing such as labs, EKG, etc, we will make arrangements to do so.  Although advances in technology are sophisticated, we cannot ensure that it will always work on either your end or our end.  If the connection with a video visit is poor, we may have to switch to a telephone visit.  With either a video or telephone visit, we are not always able to ensure that we have a secure connection.   I need to obtain your verbal consent now.   Are you willing to proceed with your visit today? Patient consent to virtual visit.

## 2022-04-05 NOTE — Telephone Encounter (Signed)
LMOVM to call back 

## 2022-04-05 NOTE — Patient Instructions (Signed)
Please have blood work and stool studies completed at [facility].  We will arrange for you to have an upper endoscopy and colonoscopy in the near future with Dr. Jena Gauss.  Continue taking pantoprazole 40 mg twice daily 30 minutes before breakfast and dinner.  Use Zofran 4 mg every 8 hours as needed for nausea and vomiting.  Continue to avoid all NSAID products.  Follow a soft or liquid diet as you tolerate these items better.  To help maintain adequate nutritional intake, try drinking at least 2 protein shakes such as boost, Ensure, etc. daily.  If you have any worsening symptoms before your procedures, please let me know.  We will plan to follow-up with you in the office after your procedures.  It was nice meeting you today!  Ermalinda Memos, PA-C [facility]

## 2022-04-05 NOTE — Addendum Note (Signed)
Addended by: Faythe Dingwall on: 04/05/2022 11:00 AM   Modules accepted: Orders

## 2022-04-05 NOTE — Telephone Encounter (Addendum)
Courtney: Please mail lab orders and stool study orders to patient from today's virtual visit.  Mindy: Please arrange EGD and colonoscopy with propofol with Dr. Jena Gauss. Dx: IDA, gastric ulcer, nausea, weight loss, diarrhea ASA 3 Hold iron x7 days prior to procedure.

## 2022-04-05 NOTE — Telephone Encounter (Signed)
Labs mailed

## 2022-04-08 NOTE — Telephone Encounter (Signed)
LMOVM to call back 

## 2022-04-10 ENCOUNTER — Encounter: Payer: Self-pay | Admitting: Orthopaedic Surgery

## 2022-04-10 ENCOUNTER — Ambulatory Visit (INDEPENDENT_AMBULATORY_CARE_PROVIDER_SITE_OTHER): Payer: BC Managed Care – PPO | Admitting: Orthopaedic Surgery

## 2022-04-10 DIAGNOSIS — M545 Low back pain, unspecified: Secondary | ICD-10-CM

## 2022-04-10 DIAGNOSIS — G8929 Other chronic pain: Secondary | ICD-10-CM | POA: Diagnosis not present

## 2022-04-10 NOTE — Progress Notes (Signed)
Virtual Visit via Telephone Note  I connected with patient on 04/10/22 at  8:10 AM EDT by telephone and verified that I am speaking with the correct person using two identifiers.  Location: Patient: home Provider: office   I discussed the limitations, risks, security and privacy concerns of performing an evaluation and management service by telephone and the availability of in person appointments. I also discussed with the patient that there may be a patient responsible charge related to this service. The patient expressed understanding and agreed to proceed.   History of Present Illness: She has had more pain in the back since she had to stop her diclofenac after her GI bleed.  She was seen in the ER on 04-01-22 for GI problems and is scheduled next week for endoscopy of upper GI.  Her pain in back is localized.  She has tried to do exercises and is taking pain medicine at times.  She has no weakness, no new trauma.   Observations/Objective: Per above.  Assessment and Plan: Encounter Diagnosis  Name Primary?   Chronic midline low back pain without sciatica Yes      Follow Up Instructions: Call next week for pain medicine refill, return in three months.  Can do virtual   I discussed the assessment and treatment plan with the patient. The patient was provided an opportunity to ask questions and all were answered. The patient agreed with the plan and demonstrated an understanding of the instructions.   The patient was advised to call back or seek an in-person evaluation if the symptoms worsen or if the condition fails to improve as anticipated.  I provided 8 minutes of non-face-to-face time during this encounter.   Darreld Mclean, MD

## 2022-04-11 ENCOUNTER — Ambulatory Visit: Payer: BC Managed Care – PPO | Admitting: Orthopaedic Surgery

## 2022-04-14 ENCOUNTER — Telehealth: Payer: Self-pay | Admitting: Orthopaedic Surgery

## 2022-04-15 MED ORDER — HYDROCODONE-ACETAMINOPHEN 7.5-325 MG PO TABS: 1.0000 | ORAL_TABLET | Freq: Four times a day (QID) | ORAL | 0 refills | Status: DC | PRN

## 2022-04-16 ENCOUNTER — Encounter: Payer: Self-pay | Admitting: *Deleted

## 2022-04-16 MED ORDER — PEG 3350-KCL-NA BICARB-NACL 420 G PO SOLR: 4000.0000 mL | Freq: Once | ORAL | 0 refills | Status: AC

## 2022-04-16 NOTE — Addendum Note (Signed)
Addended by: Armstead Peaks on: 04/16/2022 08:06 AM   Modules accepted: Orders

## 2022-04-16 NOTE — Telephone Encounter (Signed)
Spoke with pt. She has been scheduled for 9/27 at 8:30am. Aware will mail instructions/pre-op appt. Rx sent to pharmacy.

## 2022-04-19 ENCOUNTER — Ambulatory Visit: Payer: BC Managed Care – PPO | Admitting: Nurse Practitioner

## 2022-04-19 MED ORDER — HYDROCODONE-ACETAMINOPHEN 7.5-325 MG PO TABS: 1.0000 | ORAL_TABLET | Freq: Four times a day (QID) | ORAL | 0 refills | Status: DC | PRN

## 2022-04-19 NOTE — Telephone Encounter (Signed)
Patient called her pharmacy is out of her pain medication until next month. She called around and [facility] in [place] does have it.  Hydrocodone-Acetaminophen 7.5/325 MG   PATIENT WILL BE USING [facility] IN [place]  I have fixed her pharmacy in the chart.

## 2022-05-15 ENCOUNTER — Ambulatory Visit: Payer: BC Managed Care – PPO | Admitting: Nurse Practitioner

## 2022-05-15 ENCOUNTER — Other Ambulatory Visit: Payer: Self-pay | Admitting: Nurse Practitioner

## 2022-05-15 ENCOUNTER — Telehealth: Payer: Self-pay

## 2022-05-15 ENCOUNTER — Encounter: Payer: Self-pay | Admitting: Nurse Practitioner

## 2022-05-15 VITALS — BP 110/66 | HR 92 | Temp 98.5°F | Ht 67.0 in | Wt 139.0 lb

## 2022-05-15 DIAGNOSIS — R634 Abnormal weight loss: Secondary | ICD-10-CM

## 2022-05-15 DIAGNOSIS — Z136 Encounter for screening for cardiovascular disorders: Secondary | ICD-10-CM | POA: Diagnosis not present

## 2022-05-15 DIAGNOSIS — B3741 Candidal cystitis and urethritis: Secondary | ICD-10-CM

## 2022-05-15 DIAGNOSIS — R5383 Other fatigue: Secondary | ICD-10-CM | POA: Insufficient documentation

## 2022-05-15 DIAGNOSIS — R300 Dysuria: Secondary | ICD-10-CM

## 2022-05-15 LAB — URINALYSIS, ROUTINE W REFLEX MICROSCOPIC
Bilirubin, UA: NEGATIVE
Glucose, UA: NEGATIVE
Leukocytes,UA: NEGATIVE
Nitrite, UA: NEGATIVE
Specific Gravity, UA: 1.025 (ref 1.005–1.030)
Urobilinogen, Ur: 0.2 mg/dL (ref 0.2–1.0)
pH, UA: 5.5 (ref 5.0–7.5)

## 2022-05-15 LAB — MICROSCOPIC EXAMINATION
Renal Epithel, UA: NONE SEEN /hpf
WBC, UA: NONE SEEN /hpf (ref 0–5)

## 2022-05-15 MED ORDER — FLUCONAZOLE 150 MG PO TABS: 150.0000 mg | ORAL_TABLET | Freq: Once | ORAL | 0 refills | Status: AC

## 2022-05-15 MED ORDER — NITROFURANTOIN MONOHYD MACRO 100 MG PO CAPS: 100.0000 mg | ORAL_CAPSULE | Freq: Two times a day (BID) | ORAL | 0 refills | Status: DC

## 2022-05-15 NOTE — Assessment & Plan Note (Signed)
Patient is new establishing care with concerns of fatigue.  History of iron deficiency and blood transfusion.  Patient is also experiencing unexpected weight loss of a total of 55 pounds in the past 10 months.  Labs completed CBC, CMP, lipid panel, ESR, HIV, TSH, urinalysis, and chest x-ray.

## 2022-05-15 NOTE — Progress Notes (Signed)
New Patient Note  RE: [name] MRN: [id number] DOB: 11-15-1970 Date of Office Visit: 05/15/2022  Chief Complaint: Establish Care and Fatigue  History of Present Illness: Fatigue  She reports recurrent fatigue which she describes as a lack of energy, feeling exhausted, feeling sleepy, and feeling weak. It began several months ago and occurs all the time and every day. It is described as severe and worsening. She has not started new medications around the time the fatigue started.   Associated symptoms: Yes arthralgias No bleeding  No melena No chest discomfort  No heart palpitations No heart racing   No dyspnea No feeling depressed  No feeling anxious or under stress No fevers  Yes loss of appetite No nausea  No vomiting No sleeping problems    Wt Readings from Last 3 Encounters:  05/15/22 139 lb (63 kg)  04/05/22 140 lb (63.5 kg)  03/31/22 142 lb (64.4 kg)    Lab Results  Component Value Date   WBC 6.6 03/31/2022   HGB 9.4 (L) 03/31/2022   HCT 31.1 (L) 03/31/2022   MCV 90.9 03/31/2022   PLT 380 03/31/2022   Lab Results  Component Value Date   TSH 2.459 11/29/2021   Lab Results  Component Value Date   NA 138 03/31/2022   K 3.7 03/31/2022   CO2 22 03/31/2022   BUN 20 03/31/2022   CREATININE 0.81 03/31/2022   CALCIUM 8.8 (L) 03/31/2022   GLUCOSE 101 (H) 03/31/2022     ---------------------------------------------------------------------------------------------------   Assessment and Plan: [name] is a 51 y.o. female with: Fatigue Patient is new establishing care with concerns of fatigue.  History of iron deficiency and blood transfusion.  Patient is also experiencing unexpected weight loss of a total of 55 pounds in the past 10 months.  Labs completed CBC, CMP, lipid panel, ESR, HIV, TSH, urinalysis, and chest x-ray.  Recent unexplained weight loss Patient recently seen by GI, today I will repeat CBC and CMP, HIV testing, chest x-ray.  Results  pending.  Return in about 6 months (around 11/13/2022) for chronic disease managemet.   Diagnostics:   Past Medical History: Patient Active Problem List   Diagnosis Date Noted   Fatigue 05/15/2022   Gastric ulcer 04/05/2022   Diarrhea 04/05/2022   Severe anemia 11/30/2021   Arthritis 11/28/2021   Severe iron deficiency anemia 11/28/2021   Hypoalbuminemia 11/28/2021   Hypomagnesemia 11/28/2021   Iron deficiency anemia    NSAID long-term use    Recent unexplained weight loss    Nausea without vomiting    Paroxysmal atrial fibrillation with RVR (HCC) 11/27/2021   Midline low back pain without sciatica 11/02/2015   Past Medical History:  Diagnosis Date   Anemia    Arthritis    Chronic back pain    PAF (paroxysmal atrial fibrillation) ([place])    a. s/p DCCV in 10/2021   Past Surgical History: Past Surgical History:  Procedure Laterality Date   BIOPSY  11/29/2021   Procedure: BIOPSY;  Surgeon: Daneil Dolin, MD;  [facility];  Service: Endoscopy;;   CHOLECYSTECTOMY     ESOPHAGOGASTRODUODENOSCOPY (EGD) WITH PROPOFOL N/A 11/29/2021   Surgeon: Daneil Dolin, MD; extensive gastric ulceration extending into the duodenal bulb with secondary pyloric stenosis and retained gastric contents.  Pyloric channel dilated nicely with scope passage only. Gastric mucosa was biopsied and negative for H. pylori, malignancy, dysplasia.   REPLACEMENT TOTAL KNEE BILATERAL     TUBAL LIGATION  Medication List:  Current Outpatient Medications  Medication Sig Dispense Refill   albuterol (VENTOLIN HFA) 108 (90 Base) MCG/ACT inhaler Inhale 2 puffs into the lungs every 6 (six) hours as needed for wheezing or shortness of breath.     HYDROcodone-acetaminophen (NORCO) 7.5-325 MG tablet Take 1 tablet by mouth every 6 (six) hours as needed for moderate pain (Must last 30 days). 36 tablet 0   MELATONIN PO Take 1 tablet by mouth daily as needed (sleep).     Menthol, Topical Analgesic,  (BIOFREEZE ROLL-ON EX) Apply topically.     metoprolol tartrate (LOPRESSOR) 25 MG tablet Take 1 tablet (25 mg total) by mouth 2 (two) times daily. 60 tablet 11   ondansetron (ZOFRAN) 4 MG tablet Take 1 tablet (4 mg total) by mouth every 8 (eight) hours as needed for nausea or vomiting. 30 tablet 1   pantoprazole (PROTONIX) 40 MG tablet Take 1 tablet (40 mg total) by mouth 2 (two) times daily before a meal. 60 tablet 5   ferrous sulfate 324 MG TBEC Take 324 mg by mouth.     No current facility-administered medications for this visit.   Allergies: Allergies  Allergen Reactions   Penicillins Nausea And Vomiting   Social History: Social History   Socioeconomic History   Marital status: Legally Separated    Spouse name: Not on file   Number of children: Not on file   Years of education: Not on file   Highest education level: Not on file  Occupational History   Not on file  Tobacco Use   Smoking status: Former    Packs/day: 0.50    Years: 20.00    Total pack years: 10.00    Types: Cigarettes    Quit date: 11/29/2021    Years since quitting: 0.4   Smokeless tobacco: Never  Vaping Use   Vaping Use: Former  Substance and Sexual Activity   Alcohol use: No   Drug use: No   Sexual activity: Yes    Birth control/protection: None, Surgical  Other Topics Concern   Not on file  Social History Narrative   Not on file   Social Determinants of Health   Financial Resource Strain: Not on file  Food Insecurity: Not on file  Transportation Needs: Not on file  Physical Activity: Not on file  Stress: Not on file  Social Connections: Not on file       Family History: Family History  Problem Relation Age of Onset   Diabetes Mother    Stroke Father    Colon cancer Neg Hx    Inflammatory bowel disease Neg Hx          Review of Systems  Constitutional:  Positive for appetite change and fatigue.  HENT: Negative.    Respiratory: Negative.  Negative for shortness of breath.    Cardiovascular: Negative.   Gastrointestinal: Negative.  Negative for abdominal distention, abdominal pain, anal bleeding and nausea.  Genitourinary: Negative.   Musculoskeletal:  Positive for myalgias.  Skin: Negative.   All other systems reviewed and are negative.  Objective: BP 110/66   Pulse 92   Temp 98.5 F (36.9 C)   Ht 5' 7"  (1.702 m)   Wt 139 lb (63 kg)   LMP 09/16/2014 Comment: NEG U PREG 1/312/16  SpO2 100%   BMI 21.77 kg/m  Body mass index is 21.77 kg/m. Physical Exam Vitals and nursing note reviewed.  Constitutional:      Appearance: Normal appearance. She is ill-appearing.  HENT:     Head: Normocephalic.     Right Ear: External ear normal.     Left Ear: External ear normal.     Nose: Nose normal.     Mouth/Throat:     Mouth: Mucous membranes are moist.     Pharynx: Oropharynx is clear.  Eyes:     Conjunctiva/sclera: Conjunctivae normal.  Cardiovascular:     Rate and Rhythm: Normal rate.     Pulses: Normal pulses.     Heart sounds: Normal heart sounds.  Abdominal:     General: Bowel sounds are normal.  Skin:    General: Skin is warm and dry.     Findings: No erythema or rash.  Neurological:     General: No focal deficit present.     Mental Status: She is alert and oriented to person, place, and time.    The plan was reviewed with the patient/family, and all questions/concerned were addressed.  It was my pleasure to see [name] today and participate in her care. Please feel free to contact me with any questions or concerns.  Sincerely,  Jac Canavan NP [place]

## 2022-05-15 NOTE — Patient Instructions (Signed)
Fatigue If you have fatigue, you feel tired all the time and have a lack of energy or a lack of motivation. Fatigue may make it difficult to start or complete tasks because of exhaustion. Occasional or mild fatigue is often a normal response to activity or life. However, long-term (chronic) or extreme fatigue may be a symptom of a medical condition such as: Depression. Not having enough red blood cells or hemoglobin in the blood (anemia). A problem with a small gland located in the lower front part of the neck (thyroid disorder). Rheumatologic conditions. These are problems related to the body's defense system (immune system). Infections, especially certain viral infections. Fatigue can also lead to negative health outcomes over time. Follow these instructions at home: Medicines Take over-the-counter and prescription medicines only as told by your health care provider. Take a multivitamin if told by your health care provider. Do not use herbal or dietary supplements unless they are approved by your health care provider. Eating and drinking  Avoid heavy meals in the evening. Eat a well-balanced diet, which includes lean proteins, whole grains, plenty of fruits and vegetables, and low-fat dairy products. Avoid eating or drinking too many products with caffeine in them. Avoid alcohol. Drink enough fluid to keep your urine pale yellow. Activity  Exercise regularly, as told by your health care provider. Use or practice techniques to help you relax, such as yoga, tai chi, meditation, or massage therapy. Lifestyle Change situations that cause you stress. Try to keep your work and personal schedules in balance. Do not use recreational or illegal drugs. General instructions Monitor your fatigue for any changes. Go to bed and get up at the same time every day. Avoid fatigue by pacing yourself during the day and getting enough sleep at night. Maintain a healthy weight. Contact a health care  provider if: Your fatigue does not get better. You have a fever. You suddenly lose or gain weight. You have headaches. You have trouble falling asleep or sleeping through the night. You feel angry, guilty, anxious, or sad. You have swelling in your legs or another part of your body. Get help right away if: You feel confused, feel like you might faint, or faint. Your vision is blurry or you have a severe headache. You have severe pain in your abdomen, your back, or the area between your waist and hips (pelvis). You have chest pain, shortness of breath, or an irregular or fast heartbeat. You are unable to urinate, or you urinate less than normal. You have abnormal bleeding from the rectum, nose, lungs, nipples, or, if you are female, the vagina. You vomit blood. You have thoughts about hurting yourself or others. These symptoms may be an emergency. Get help right away. Call 911. Do not wait to see if the symptoms will go away. Do not drive yourself to the hospital. Get help right away if you feel like you may hurt yourself or others, or have thoughts about taking your own life. Go to your nearest emergency room or: Call 911. Call the [facility] at [phone] or 988. This is open 24 hours a day. Text the Crisis Text Line at 979-823-0385. Summary If you have fatigue, you feel tired all the time and have a lack of energy or a lack of motivation. Fatigue may make it difficult to start or complete tasks because of exhaustion. Long-term (chronic) or extreme fatigue may be a symptom of a medical condition. Exercise regularly, as told by your health care provider.  Change situations that cause you stress. Try to keep your work and personal schedules in balance. This information is not intended to replace advice given to you by your health care provider. Make sure you discuss any questions you have with your health care provider. Document Revised: 06/11/2021 Document  Reviewed: 06/11/2021 Elsevier Patient Education  2023 Elsevier Inc.  

## 2022-05-15 NOTE — Telephone Encounter (Signed)
Hydrocodone-Acetaminophen 7.5/325 MG    PATIENT USES [facility]

## 2022-05-15 NOTE — Assessment & Plan Note (Signed)
Patient recently seen by GI, today I will repeat CBC and CMP, HIV testing, chest x-ray.  Results pending.

## 2022-05-16 LAB — CMP14+EGFR
ALT: 11 IU/L (ref 0–32)
AST: 18 IU/L (ref 0–40)
Albumin/Globulin Ratio: 1.1 — ABNORMAL LOW (ref 1.2–2.2)
Albumin: 3.9 g/dL (ref 3.8–4.9)
Alkaline Phosphatase: 129 IU/L — ABNORMAL HIGH (ref 44–121)
BUN/Creatinine Ratio: 21 (ref 9–23)
BUN: 17 mg/dL (ref 6–24)
Bilirubin Total: 0.2 mg/dL (ref 0.0–1.2)
CO2: 23 mmol/L (ref 20–29)
Calcium: 10 mg/dL (ref 8.7–10.2)
Chloride: 101 mmol/L (ref 96–106)
Creatinine, Ser: 0.8 mg/dL (ref 0.57–1.00)
Globulin, Total: 3.4 g/dL (ref 1.5–4.5)
Glucose: 77 mg/dL (ref 70–99)
Potassium: 3.9 mmol/L (ref 3.5–5.2)
Sodium: 139 mmol/L (ref 134–144)
Total Protein: 7.3 g/dL (ref 6.0–8.5)
eGFR: 89 mL/min/{1.73_m2} (ref >59)

## 2022-05-16 LAB — HEPATIC FUNCTION PANEL: Bilirubin, Direct: <0.1 mg/dL (ref 0.00–0.40)

## 2022-05-16 LAB — LIPID PANEL
Chol/HDL Ratio: 2.8 ratio (ref 0.0–4.4)
Cholesterol, Total: 152 mg/dL (ref 100–199)
HDL: 54 mg/dL (ref >39)
LDL Chol Calc (NIH): 76 mg/dL (ref 0–99)
Triglycerides: 122 mg/dL (ref 0–149)
VLDL Cholesterol Cal: 22 mg/dL (ref 5–40)

## 2022-05-16 LAB — CBC WITH DIFFERENTIAL/PLATELET
Basophils Absolute: 0.1 10*3/uL (ref 0.0–0.2)
Basos: 1 %
EOS (ABSOLUTE): 0.2 10*3/uL (ref 0.0–0.4)
Eos: 2 %
Hematocrit: 35.5 % (ref 34.0–46.6)
Hemoglobin: 10.9 g/dL — ABNORMAL LOW (ref 11.1–15.9)
Immature Grans (Abs): 0 10*3/uL (ref 0.0–0.1)
Immature Granulocytes: 0 %
Lymphocytes Absolute: 2.9 10*3/uL (ref 0.7–3.1)
Lymphs: 34 %
MCH: 27.5 pg (ref 26.6–33.0)
MCHC: 30.7 g/dL — ABNORMAL LOW (ref 31.5–35.7)
MCV: 89 fL (ref 79–97)
Monocytes Absolute: 0.6 10*3/uL (ref 0.1–0.9)
Monocytes: 7 %
Neutrophils Absolute: 4.8 10*3/uL (ref 1.4–7.0)
Neutrophils: 56 %
Platelets: 379 10*3/uL (ref 150–450)
RBC: 3.97 x10E6/uL (ref 3.77–5.28)
RDW: 16.6 % — ABNORMAL HIGH (ref 11.7–15.4)
WBC: 8.7 10*3/uL (ref 3.4–10.8)

## 2022-05-16 LAB — TSH: TSH: 3.5 u[IU]/mL (ref 0.450–4.500)

## 2022-05-16 LAB — SEDIMENTATION RATE: Sed Rate: 32 mm/hr (ref 0–40)

## 2022-05-16 MED ORDER — HYDROCODONE-ACETAMINOPHEN 7.5-325 MG PO TABS: 1.0000 | ORAL_TABLET | Freq: Four times a day (QID) | ORAL | 0 refills | Status: DC | PRN

## 2022-05-23 NOTE — Patient Instructions (Signed)
ABRIA BRONAUGH  05/23/2022     @PREFPERIOPPHARMACY @   Your procedure is scheduled on  05/29/2022.   Report to [facility] at  0700  A.M.   Call this number if you have problems the morning of surgery:  [phone]   Remember:  Follow the diet and prep instructions given to you by the office.     Take these medicines the morning of surgery with A SIP OF WATER            norco (if needed), metoprolol, zofran (If needed), protonix.     Do not wear jewelry, make-up or nail polish.  Do not wear lotions, powders, or perfumes, or deodorant.  Do not shave 48 hours prior to surgery.  Men may shave face and neck.  Do not bring valuables to the hospital.  [facility] is not responsible for any belongings or valuables.  Contacts, dentures or bridgework may not be worn into surgery.  Leave your suitcase in the car.  After surgery it may be brought to your room.  For patients admitted to the hospital, discharge time will be determined by your treatment team.  Patients discharged the day of surgery will not be allowed to drive home and must have someone with them for 24 hours.    Special instructions:   DO NOT smoke tobacco or vape for 24 hours before your procedure.   Please read over the following fact sheets that you were given. Anesthesia Post-op Instructions and Care and Recovery After Surgery      Upper Endoscopy, Adult, Care After After the procedure, it is common to have a sore throat. It is also common to have: Mild stomach pain or discomfort. Bloating. Nausea. Follow these instructions at home: The instructions below may help you care for yourself at home. Your health care provider may give you more instructions. If you have questions, ask your health care provider. If you were given a sedative during the procedure, it can affect you for several hours. Do not drive or operate machinery until your health care provider says that it is safe. If you will be  going home right after the procedure, plan to have a responsible adult: Take you home from the hospital or clinic. You will not be allowed to drive. Care for you for the time you are told. Follow instructions from your health care provider about what you may eat and drink. Return to your normal activities as told by your health care provider. Ask your health care provider what activities are safe for you. Take over-the-counter and prescription medicines only as told by your health care provider. Contact a health care provider if you: Have a sore throat that lasts longer than one day. Have trouble swallowing. Have a fever. Get help right away if you: Vomit blood or your vomit looks like coffee grounds. Have bloody, black, or tarry stools. Have a very bad sore throat or you cannot swallow. Have difficulty breathing or very bad pain in your chest or abdomen. These symptoms may be an emergency. Get help right away. Call 911. Do not wait to see if the symptoms will go away. Do not drive yourself to the hospital. Summary After the procedure, it is common to have a sore throat, mild stomach discomfort, bloating, and nausea. If you were given a sedative during the procedure, it can affect you for several hours. Do not drive until your health care provider says that  it is safe. Follow instructions from your health care provider about what you may eat and drink. Return to your normal activities as told by your health care provider. This information is not intended to replace advice given to you by your health care provider. Make sure you discuss any questions you have with your health care provider. Document Revised: 11/28/2021 Document Reviewed: 11/28/2021 Elsevier Patient Education  [employer]. Colonoscopy, Adult, Care After The following information offers guidance on how to care for yourself after your procedure. Your health care provider may also give you more specific instructions. If  you have problems or questions, contact your health care provider. What can I expect after the procedure? After the procedure, it is common to have: A small amount of blood in your stool for 24 hours after the procedure. Some gas. Mild cramping or bloating of your abdomen. Follow these instructions at home: Eating and drinking  Drink enough fluid to keep your urine pale yellow. Follow instructions from your health care provider about eating or drinking restrictions. Resume your normal diet as told by your health care provider. Avoid heavy or fried foods that are hard to digest. Activity Rest as told by your health care provider. Avoid sitting for a long time without moving. Get up to take short walks every 1-2 hours. This is important to improve blood flow and breathing. Ask for help if you feel weak or unsteady. Return to your normal activities as told by your health care provider. Ask your health care provider what activities are safe for you. Managing cramping and bloating  Try walking around when you have cramps or feel bloated. If directed, apply heat to your abdomen as told by your health care provider. Use the heat source that your health care provider recommends, such as a moist heat pack or a heating pad. Place a towel between your skin and the heat source. Leave the heat on for 20-30 minutes. Remove the heat if your skin turns bright red. This is especially important if you are unable to feel pain, heat, or cold. You have a greater risk of getting burned. General instructions If you were given a sedative during the procedure, it can affect you for several hours. Do not drive or operate machinery until your health care provider says that it is safe. For the first 24 hours after the procedure: Do not sign important documents. Do not drink alcohol. Do your regular daily activities at a slower pace than normal. Eat soft foods that are easy to digest. Take over-the-counter and  prescription medicines only as told by your health care provider. Keep all follow-up visits. This is important. Contact a health care provider if: You have blood in your stool 2-3 days after the procedure. Get help right away if: You have more than a small spotting of blood in your stool. You have large blood clots in your stool. You have swelling of your abdomen. You have nausea or vomiting. You have a fever. You have increasing pain in your abdomen that is not relieved with medicine. These symptoms may be an emergency. Get help right away. Call 911. Do not wait to see if the symptoms will go away. Do not drive yourself to the hospital. Summary After the procedure, it is common to have a small amount of blood in your stool. You may also have mild cramping and bloating of your abdomen. If you were given a sedative during the procedure, it can affect you for several hours.  Do not drive or operate machinery until your health care provider says that it is safe. Get help right away if you have a lot of blood in your stool, nausea or vomiting, a fever, or increased pain in your abdomen. This information is not intended to replace advice given to you by your health care provider. Make sure you discuss any questions you have with your health care provider. Document Revised: 04/11/2021 Document Reviewed: 04/11/2021 Elsevier Patient Education  2023 Elsevier Inc. Monitored Anesthesia Care, Care After This sheet gives you information about how to care for yourself after your procedure. Your health care provider may also give you more specific instructions. If you have problems or questions, contact your health care provider. What can I expect after the procedure? After the procedure, it is common to have: Tiredness. Forgetfulness about what happened after the procedure. Impaired judgment for important decisions. Nausea or vomiting. Some difficulty with balance. Follow these instructions at  home: For the time period you were told by your health care provider:     Rest as needed. Do not participate in activities where you could fall or become injured. Do not drive or use machinery. Do not drink alcohol. Do not take sleeping pills or medicines that cause drowsiness. Do not make important decisions or sign legal documents. Do not take care of children on your own. Eating and drinking Follow the diet that is recommended by your health care provider. Drink enough fluid to keep your urine pale yellow. If you vomit: Drink water, juice, or soup when you can drink without vomiting. Make sure you have little or no nausea before eating solid foods. General instructions Have a responsible adult stay with you for the time you are told. It is important to have someone help care for you until you are awake and alert. Take over-the-counter and prescription medicines only as told by your health care provider. If you have sleep apnea, surgery and certain medicines can increase your risk for breathing problems. Follow instructions from your health care provider about wearing your sleep device: Anytime you are sleeping, including during daytime naps. While taking prescription pain medicines, sleeping medicines, or medicines that make you drowsy. Avoid smoking. Keep all follow-up visits as told by your health care provider. This is important. Contact a health care provider if: You keep feeling nauseous or you keep vomiting. You feel light-headed. You are still sleepy or having trouble with balance after 24 hours. You develop a rash. You have a fever. You have redness or swelling around the IV site. Get help right away if: You have trouble breathing. You have new-onset confusion at home. Summary For several hours after your procedure, you may feel tired. You may also be forgetful and have poor judgment. Have a responsible adult stay with you for the time you are told. It is important to  have someone help care for you until you are awake and alert. Rest as told. Do not drive or operate machinery. Do not drink alcohol or take sleeping pills. Get help right away if you have trouble breathing, or if you suddenly become confused. This information is not intended to replace advice given to you by your health care provider. Make sure you discuss any questions you have with your health care provider. Document Revised: 07/24/2021 Document Reviewed: 07/22/2019 Elsevier Patient Education  2023 [employer].

## 2022-05-27 ENCOUNTER — Telehealth: Payer: Self-pay | Admitting: *Deleted

## 2022-05-27 ENCOUNTER — Ambulatory Visit: Payer: BC Managed Care – PPO | Admitting: Nurse Practitioner

## 2022-05-27 ENCOUNTER — Encounter: Payer: Self-pay | Admitting: Nurse Practitioner

## 2022-05-27 ENCOUNTER — Encounter (HOSPITAL_COMMUNITY): Payer: Self-pay

## 2022-05-27 ENCOUNTER — Encounter (HOSPITAL_COMMUNITY)
Admission: RE | Admit: 2022-05-27 | Discharge: 2022-05-27 | Disposition: A | Discharge status: Home / Self Care | Payer: BC Managed Care – PPO | Source: Ambulatory Visit | Attending: Internal Medicine | Admitting: Internal Medicine

## 2022-05-27 VITALS — BP 90/66 | HR 91 | Temp 98.6°F | Ht 67.0 in | Wt 137.0 lb

## 2022-05-27 DIAGNOSIS — R531 Weakness: Secondary | ICD-10-CM | POA: Diagnosis not present

## 2022-05-27 DIAGNOSIS — E860 Dehydration: Secondary | ICD-10-CM

## 2022-05-27 NOTE — Telephone Encounter (Signed)
-----   Message from Wilmer Floor, RN sent at 05/27/2022 11:35 AM EDT ----- Regarding: No show for PAT [name],  I tried to call Ms Crocker but her voice mailbox was full.  She is for Dr Gala Romney 9/27  Thanks,  Rosalyn Gess RN

## 2022-05-27 NOTE — Patient Instructions (Signed)
Dehydration, Adult Dehydration is condition in which there is not enough water or other fluids in the body. This happens when a person loses more fluids than he or she takes in. Important body parts cannot work right without the right amount of fluids. Any loss of fluids from the body can cause dehydration. Dehydration can be mild, worse, or very bad. It should be treated right away to keep it from getting very bad. What are the causes? This condition may be caused by: Conditions that cause loss of water or other fluids, such as: Watery poop (diarrhea). Vomiting. Sweating a lot. Peeing (urinating) a lot. Not drinking enough fluids, especially when you: Are ill. Are doing things that take a lot of energy to do. Other illnesses and conditions, such as fever or infection. Certain medicines, such as medicines that take extra fluid out of the body (diuretics). Lack of safe drinking water. Not being able to get enough water and food. What increases the risk? The following factors may make you more likely to develop this condition: Having a long-term (chronic) illness that has not been treated the right way, such as: Diabetes. Heart disease. Kidney disease. Being 65 years of age or older. Having a disability. Living in a place that is high above the ground or sea (high in altitude). The thinner, dried air causes more fluid loss. Doing exercises that put stress on your body for a long time. What are the signs or symptoms? Symptoms of dehydration depend on how bad it is. Mild or worse dehydration Thirst. Dry lips or dry mouth. Feeling dizzy or light-headed, especially when you stand up from sitting. Muscle cramps. Your body making: Dark pee (urine). Pee may be the color of tea. Less pee than normal. Less tears than normal. Headache. Very bad dehydration Changes in skin. Skin may: Be cold to the touch (clammy). Be blotchy or pale. Not go back to normal right after you lightly pinch  it and let it go. Little or no tears, pee, or sweat. Changes in vital signs, such as: Fast breathing. Low blood pressure. Weak pulse. Pulse that is more than 100 beats a minute when you are sitting still. Other changes, such as: Feeling very thirsty. Eyes that look hollow (sunken). Cold hands and feet. Being mixed up (confused). Being very tired (lethargic) or having trouble waking from sleep. Short-term weight loss. Loss of consciousness. How is this treated? Treatment for this condition depends on how bad it is. Treatment should start right away. Do not wait until your condition gets very bad. Very bad dehydration is an emergency. You will need to go to a hospital. Mild or worse dehydration can be treated at home. You may be asked to: Drink more fluids. Drink an oral rehydration solution (ORS). This drink helps get the right amounts of fluids and salts and minerals in the blood (electrolytes). Very bad dehydration can be treated: With fluids through an IV tube. By getting normal levels of salts and minerals in your blood. This is often done by giving salts and minerals through a tube. The tube is passed through your nose and into your stomach. By treating the root cause. Follow these instructions at home: Oral rehydration solution If told by your doctor, drink an ORS: Make an ORS. Use instructions on the package. Start by drinking small amounts, about  cup (120 mL) every 5-10 minutes. Slowly drink more until you have had the amount that your doctor said to have. Eating and drinking          Drink enough clear fluid to keep your pee pale yellow. If you were told to drink an ORS, finish the ORS first. Then, start slowly drinking other clear fluids. Drink fluids such as: Water. Do not drink only water. Doing that can make the salt (sodium) level in your body get too low. Water from ice chips you suck on. Fruit juice that you have added water to (diluted). Low-calorie sports  drinks. Eat foods that have the right amounts of salts and minerals, such as: Bananas. Oranges. Potatoes. Tomatoes. Spinach. Do not drink alcohol. Avoid: Drinks that have a lot of sugar. These include: High-calorie sports drinks. Fruit juice that you did not add water to. Soda. Caffeine. Foods that are greasy or have a lot of fat or sugar. General instructions Take over-the-counter and prescription medicines only as told by your doctor. Do not take salt tablets. Doing that can make the salt level in your body get too high. Return to your normal activities as told by your doctor. Ask your doctor what activities are safe for you. Keep all follow-up visits as told by your doctor. This is important. Contact a doctor if: You have pain in your belly (abdomen) and the pain: Gets worse. Stays in one place. You have a rash. You have a stiff neck. You get angry or annoyed (irritable) more easily than normal. You are more tired or have a harder time waking than normal. You feel: Weak or dizzy. Very thirsty. Get help right away if you have: Any symptoms of very bad dehydration. Symptoms of vomiting, such as: You cannot eat or drink without vomiting. Your vomiting gets worse or does not go away. Your vomit has blood or green stuff in it. Symptoms that get worse with treatment. A fever. A very bad headache. Problems with peeing or pooping (having a bowel movement), such as: Watery poop that gets worse or does not go away. Blood in your poop (stool). This may cause poop to look black and tarry. Not peeing in 6-8 hours. Peeing only a small amount of very dark pee in 6-8 hours. Trouble breathing. These symptoms may be an emergency. Do not wait to see if the symptoms will go away. Get medical help right away. Call your local emergency services (911 in the [place] Do not drive yourself to the hospital. Summary Dehydration is a condition in which there is not enough water or other fluids  in the body. This happens when a person loses more fluids than he or she takes in. Treatment for this condition depends on how bad it is. Treatment should be started right away. Do not wait until your condition gets very bad. Drink enough clear fluid to keep your pee pale yellow. If you were told to drink an oral rehydration solution (ORS), finish the ORS first. Then, start slowly drinking other clear fluids. Take over-the-counter and prescription medicines only as told by your doctor. Get help right away if you have any symptoms of very bad dehydration. This information is not intended to replace advice given to you by your health care provider. Make sure you discuss any questions you have with your health care provider. Document Revised: 12/26/2021 Document Reviewed: 04/01/2019 Elsevier Patient Education  2023 Elsevier Inc.  

## 2022-05-27 NOTE — Progress Notes (Signed)
Acute Office Visit  Subjective:     Patient ID: [name], female    DOB: 03-May-1971, 51 y.o.   MRN: [id number]  Chief Complaint  Patient presents with   Fatigue   Headache   Emesis   Diarrhea    Headache  This is a new problem. The current episode started yesterday. The problem has been unchanged. The pain is located in the Bilateral region. The pain does not radiate. The pain quality is not similar to prior headaches. The quality of the pain is described as aching. The pain is moderate. Associated symptoms include anorexia, insomnia, muscle aches, nausea and weakness. Pertinent negatives include no abdominal pain, abnormal behavior, back pain, blurred vision, dizziness, fever, hearing loss, numbness, sinus pressure, tingling, tinnitus, visual change or vomiting. She has tried nothing for the symptoms.  Extremity Weakness  Pain location: Generalized weakness. This is a new problem. The current episode started in the past 7 days. There has been no history of extremity trauma. The problem occurs constantly. The quality of the pain is described as aching. The pain is moderate. Pertinent negatives include no fever, joint locking, joint swelling, limited range of motion, numbness, stiffness or tingling. She has tried nothing for the symptoms.   Patient is in today for dehydration.  Patient is not drinking, or eating.  She reports fatigue, dizziness, and weakness.  Denies nausea, vomiting, and fever. Symptoms present in the past few days.  Review of Systems  Constitutional:  Positive for malaise/fatigue. Negative for fever.  HENT: Negative.  Negative for hearing loss, sinus pressure and tinnitus.   Eyes:  Negative for blurred vision.  Cardiovascular: Negative.   Gastrointestinal:  Positive for anorexia and nausea. Negative for abdominal pain and vomiting.  Genitourinary: Negative.   Musculoskeletal:  Positive for extremity weakness. Negative for back pain and stiffness.  Skin:  Negative.   Neurological:  Positive for weakness and headaches. Negative for dizziness, tingling and numbness.  Psychiatric/Behavioral:  The patient has insomnia.   All other systems reviewed and are negative.       Objective:    BP 90/66   Pulse 91   Temp 98.6 F (37 C)   Ht 5\' 7"  (1.702 m)   Wt 137 lb (62.1 kg)   LMP 09/16/2014 Comment: NEG U PREG 1/312/16  SpO2 96%   BMI 21.46 kg/m  BP Readings from Last 3 Encounters:  05/27/22 90/66  05/15/22 110/66  04/01/22 100/62   Wt Readings from Last 3 Encounters:  05/27/22 137 lb (62.1 kg)  05/15/22 139 lb (63 kg)  04/05/22 140 lb (63.5 kg)      Physical Exam Vitals and nursing note reviewed.  Constitutional:      Appearance: She is well-developed. She is ill-appearing.  HENT:     Head: Normocephalic.     Right Ear: External ear normal.     Left Ear: External ear normal.     Nose: Nose normal.     Mouth/Throat:     Mouth: Mucous membranes are dry.     Pharynx: Oropharynx is clear.  Eyes:     Conjunctiva/sclera: Conjunctivae normal.  Cardiovascular:     Rate and Rhythm: Normal rate and regular rhythm.     Heart sounds: Normal heart sounds.  Pulmonary:     Effort: Pulmonary effort is normal.     Breath sounds: Normal breath sounds.  Abdominal:     General: Bowel sounds are normal.     Palpations: Abdomen is soft.  Skin:    General: Skin is dry.     Findings: No rash.  Neurological:     General: No focal deficit present.     Mental Status: She is alert and oriented to person, place, and time.     No results found for any visits on 05/27/22.      Assessment & Plan:  -Patient presents with generalized weakness, loss of appetite, and headache. -Completed B12 results pending. -Advised patient to increase hydration, Gatorade to replenish electrolytes and water.   -Incorporate fruits and green leafy vegetables in diet. -2 weeks ago patient was started on FE 325 mg tablet by mouth daily for low  hemoglobin. -Tylenol for headache -Follow-up with unresolved symptoms. -Completed COVID-19 swab results pending.  Problem List Items Addressed This Visit   None Visit Diagnoses     Weakness    -  Primary   Relevant Orders   COVID-19, Flu A+B and RSV   Vitamin B12   Dehydration           No orders of the defined types were placed in this encounter.   Return if symptoms worsen or fail to improve.  Daryll Drown, NP

## 2022-05-27 NOTE — Telephone Encounter (Signed)
Claled pt, no answer and not able to leave VM

## 2022-05-27 NOTE — Telephone Encounter (Signed)
Called pt again, no answer.

## 2022-05-28 LAB — COVID-19, FLU A+B AND RSV
Influenza A, NAA: NOT DETECTED
Influenza B, NAA: NOT DETECTED
RSV, NAA: NOT DETECTED
SARS-CoV-2, NAA: NOT DETECTED

## 2022-05-28 LAB — VITAMIN B12: Vitamin B-12: 1997 pg/mL — ABNORMAL HIGH (ref 232–1245)

## 2022-05-28 NOTE — Telephone Encounter (Signed)
Called pt, no answer and not able to leave VM 

## 2022-05-29 ENCOUNTER — Ambulatory Visit (HOSPITAL_COMMUNITY)
Admission: RE | Admit: 2022-05-29 | Payer: BC Managed Care – PPO | Source: Ambulatory Visit | Admitting: Internal Medicine

## 2022-05-29 ENCOUNTER — Encounter (HOSPITAL_COMMUNITY): Admission: RE | Payer: Self-pay | Source: Ambulatory Visit

## 2022-05-29 ENCOUNTER — Encounter (HOSPITAL_COMMUNITY): Payer: Self-pay | Admitting: Certified Registered Nurse Anesthetist

## 2022-05-29 SURGERY — COLONOSCOPY WITH PROPOFOL
Anesthesia: Monitor Anesthesia Care

## 2022-06-11 ENCOUNTER — Other Ambulatory Visit: Payer: Self-pay | Admitting: Orthopaedic Surgery

## 2022-06-12 MED ORDER — HYDROCODONE-ACETAMINOPHEN 7.5-325 MG PO TABS: 1.0000 | ORAL_TABLET | Freq: Four times a day (QID) | ORAL | 0 refills | Status: DC | PRN

## 2022-06-20 ENCOUNTER — Telehealth: Payer: Self-pay | Admitting: Nurse Practitioner

## 2022-06-20 NOTE — Telephone Encounter (Addendum)
[name], front office supervisor got information from pt for [facility]. PCP put pt [facility] 05/26/22-05/30/22 for dehydration & weakness. Form filled out and placed on providers desk

## 2022-06-27 NOTE — Telephone Encounter (Signed)
Closing encounter, forms were faxed on 06/21/22

## 2022-07-10 ENCOUNTER — Telehealth: Payer: Self-pay | Admitting: Orthopedic Surgery

## 2022-07-10 MED ORDER — HYDROCODONE-ACETAMINOPHEN 7.5-325 MG PO TABS: 1.0000 | ORAL_TABLET | Freq: Four times a day (QID) | ORAL | 0 refills | Status: DC | PRN

## 2022-07-11 ENCOUNTER — Encounter: Payer: Self-pay | Admitting: Orthopaedic Surgery

## 2022-07-11 ENCOUNTER — Ambulatory Visit (INDEPENDENT_AMBULATORY_CARE_PROVIDER_SITE_OTHER): Payer: BC Managed Care – PPO | Admitting: Orthopaedic Surgery

## 2022-07-11 DIAGNOSIS — G8929 Other chronic pain: Secondary | ICD-10-CM

## 2022-07-11 DIAGNOSIS — M545 Low back pain, unspecified: Secondary | ICD-10-CM

## 2022-07-11 NOTE — Progress Notes (Signed)
Virtual Visit via Telephone Note  I connected with [name] on 07/11/22 at  8:00 AM EST by telephone and verified that I am speaking with the correct person using two identifiers.  Location: Patient: hme Provider: office   I discussed the limitations, risks, security and privacy concerns of performing an evaluation and management service by telephone and the availability of in person appointments. I also discussed with the patient that there may be a patient responsible charge related to this service. The patient expressed understanding and agreed to proceed.   History of Present Illness: She has had more back pain as she had to stop the NSAIDs.  She had to postpone her endoscopy and it is rescheduled.  Her back pain is worse in the mornings and on rainy days.  She has no new trauma, no paresthesias, no weakness.  She is doing her exercises.   Observations/Objective: Per above.  Assessment and Plan: Encounter Diagnosis  Name Primary?   Chronic midline low back pain without sciatica Yes      Follow Up Instructions: 3 months   I discussed the assessment and treatment plan with the patient. The patient was provided an opportunity to ask questions and all were answered. The patient agreed with the plan and demonstrated an understanding of the instructions.   The patient was advised to call back or seek an in-person evaluation if the symptoms worsen or if the condition fails to improve as anticipated.  I provided 8 minutes of non-face-to-face time during this encounter.   Darreld Mclean, MD

## 2022-07-23 ENCOUNTER — Ambulatory Visit: Payer: BC Managed Care – PPO | Admitting: Cardiology

## 2022-07-23 NOTE — Progress Notes (Deleted)
Clinical Summary [name] is a 51 y.o.female  Afib - new diagnosis during 10/2021 admission.  - hypotensive in ER, was urgently cardioverted. BP's not much improved after cardioversion, appears hypotension may have been more driven by hypovolemia and severe anemia  - CHADS2Vasc score is 1, complete 1 month of anticoag after DCCV then stopped   2.History of GI bleed - Her Hgb declined to 5.5 during her recent admission and she required transfusions and EGD showed extensive gastric ulceration felt to be secondary to NSAID use. She is no longer on NSAIDS and has been taking Protonix 40mg  BID.   Past Medical History:  Diagnosis Date   Anemia    Arthritis    Chronic back pain    PAF (paroxysmal atrial fibrillation) ([place])    a. s/p DCCV in 10/2021     Allergies  Allergen Reactions   Penicillins Nausea And Vomiting     Current Outpatient Medications  Medication Sig Dispense Refill   HYDROcodone-acetaminophen (NORCO) 7.5-325 MG tablet Take 1 tablet by mouth every 6 (six) hours as needed for moderate pain (Must last 30 days). 36 tablet 0   albuterol (VENTOLIN HFA) 108 (90 Base) MCG/ACT inhaler Inhale 2 puffs into the lungs every 6 (six) hours as needed for wheezing or shortness of breath.     ferrous sulfate 324 MG TBEC Take 324 mg by mouth.     MELATONIN PO Take 1 tablet by mouth daily as needed (sleep).     Menthol, Topical Analgesic, (BIOFREEZE ROLL-ON EX) Apply topically.     metoprolol tartrate (LOPRESSOR) 25 MG tablet Take 1 tablet (25 mg total) by mouth 2 (two) times daily. 60 tablet 11   nitrofurantoin, macrocrystal-monohydrate, (MACROBID) 100 MG capsule Take 1 capsule (100 mg total) by mouth 2 (two) times daily. 1 po BId 14 capsule 0   ondansetron (ZOFRAN) 4 MG tablet Take 1 tablet (4 mg total) by mouth every 8 (eight) hours as needed for nausea or vomiting. 30 tablet 1   pantoprazole (PROTONIX) 40 MG tablet Take 1 tablet (40 mg total) by mouth 2 (two) times daily  before a meal. 60 tablet 5   No current facility-administered medications for this visit.     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  Pyloric channel dilated nicely with scope passage only. Gastric mucosa was biopsied and negative for H. pylori, malignancy, dysplasia.   REPLACEMENT TOTAL KNEE BILATERAL     TUBAL LIGATION       Allergies  Allergen Reactions   Penicillins Nausea And Vomiting      Family History  Problem Relation Age of Onset   Diabetes Mother    Stroke Father    Colon cancer Neg Hx    Inflammatory bowel disease Neg Hx      Social History [name] reports that she quit smoking about 7 months ago. Her smoking use included cigarettes. She has a 10.00 pack-year smoking history. She has never used smokeless tobacco. [name] reports no history of alcohol use.   Review of Systems CONSTITUTIONAL: No weight loss, fever, chills, weakness or fatigue.  HEENT: Eyes: No visual loss,  blurred vision, double vision or yellow sclerae.No hearing loss, sneezing, congestion, runny nose or sore throat.  SKIN: No rash or itching.  CARDIOVASCULAR:  RESPIRATORY: No shortness of breath, cough or sputum.  GASTROINTESTINAL: No anorexia, nausea, vomiting or diarrhea. No abdominal pain or blood.  GENITOURINARY: No burning on urination, no polyuria NEUROLOGICAL: No headache, dizziness, syncope, paralysis, ataxia, numbness or tingling in the extremities. No change in bowel or bladder control.  MUSCULOSKELETAL: No muscle, back pain, joint pain or stiffness.  LYMPHATICS: No enlarged nodes. No history of splenectomy.   PSYCHIATRIC: No history of depression or anxiety.  ENDOCRINOLOGIC: No reports of sweating, cold or heat intolerance. No polyuria or polydipsia.  Marland Kitchen   Physical Examination There were no vitals filed for this visit. There were no vitals filed for this visit.  Gen: resting comfortably, no acute distress HEENT: no scleral icterus, pupils equal round and reactive, no palptable cervical adenopathy,  CV Resp: Clear to auscultation bilaterally GI: abdomen is soft, non-tender, non-distended, normal bowel sounds, no hepatosplenomegaly MSK: extremities are warm, no edema.  Skin: warm, no rash Neuro:  no focal deficits Psych: appropriate affect   Diagnostic Studies     Assessment and Plan        Antoine Poche, M.D., F.A.C.C.

## 2022-08-10 ENCOUNTER — Other Ambulatory Visit: Payer: Self-pay | Admitting: Orthopaedic Surgery

## 2022-08-12 ENCOUNTER — Telehealth: Payer: Self-pay | Admitting: Orthopaedic Surgery

## 2022-08-12 MED ORDER — HYDROCODONE-ACETAMINOPHEN 7.5-325 MG PO TABS: 1.0000 | ORAL_TABLET | Freq: Four times a day (QID) | ORAL | 0 refills | Status: DC | PRN

## 2022-08-13 ENCOUNTER — Other Ambulatory Visit: Payer: Self-pay | Admitting: Student

## 2022-09-09 ENCOUNTER — Other Ambulatory Visit: Payer: Self-pay

## 2022-09-09 ENCOUNTER — Telehealth: Payer: Self-pay | Admitting: Orthopaedic Surgery

## 2022-09-09 MED ORDER — HYDROCODONE-ACETAMINOPHEN 7.5-325 MG PO TABS: 1.0000 | ORAL_TABLET | Freq: Four times a day (QID) | ORAL | 0 refills | Status: DC | PRN

## 2022-09-09 NOTE — Telephone Encounter (Signed)
GI provider is to refill Protonix per provider.

## 2022-09-09 NOTE — Telephone Encounter (Signed)
From: [name] To: Office of Erma Heritage, [place] Sent: 09/08/2022 12:25 PM EST Subject: Medication Renewal Request  Refills have been requested for the following medications:   pantoprazole (PROTONIX) 40 MG tablet Marye Round M Strader]  Preferred pharmacy: [facility], [place] Delivery method: [employer]

## 2022-09-10 ENCOUNTER — Other Ambulatory Visit: Payer: Self-pay | Admitting: Student

## 2022-09-17 ENCOUNTER — Encounter: Payer: Self-pay | Admitting: *Deleted

## 2022-09-28 ENCOUNTER — Telehealth: Payer: BC Managed Care – PPO | Admitting: Physician Assistant

## 2022-09-28 DIAGNOSIS — U071 COVID-19: Secondary | ICD-10-CM

## 2022-09-28 MED ORDER — NIRMATRELVIR/RITONAVIR (PAXLOVID)TABLET: 3.0000 | ORAL_TABLET | Freq: Two times a day (BID) | ORAL | 0 refills | Status: AC

## 2022-09-28 MED ORDER — BENZONATATE 100 MG PO CAPS: ORAL_CAPSULE | ORAL | 0 refills | Status: DC

## 2022-09-28 NOTE — Patient Instructions (Signed)
[name], thank you for joining Tami Rad, PA-C for today's virtual visit.  While this provider is not your primary care provider (PCP), if your PCP is located in our provider database this encounter information will be shared with them immediately following your visit.   [name] account gives you access to today's visit and all your visits, tests, and labs performed at [facility] " click here if you don't have a [facility] account or go to [email or web address]  Consent: (Patient) [name] provided verbal consent for this virtual visit at the beginning of the encounter.  Current Medications:  Current Outpatient Medications:    benzonatate (TESSALON) 100 MG capsule, Take 1-2 caps PO TID PRN, Disp: 20 capsule, Rfl: 0   HYDROcodone-acetaminophen (NORCO) 7.5-325 MG tablet, Take 1 tablet by mouth every 6 (six) hours as needed for moderate pain (Must last 30 days)., Disp: 36 tablet, Rfl: 0   nirmatrelvir/ritonavir (PAXLOVID) 20 x 150 MG & 10 x 100MG  TABS, Take 3 tablets by mouth 2 (two) times daily for 5 days. (Take nirmatrelvir 150 mg two tablets twice daily for 5 days and ritonavir 100 mg one tablet twice daily for 5 days) Patient GFR is 89, Disp: 30 tablet, Rfl: 0   albuterol (VENTOLIN HFA) 108 (90 Base) MCG/ACT inhaler, Inhale 2 puffs into the lungs every 6 (six) hours as needed for wheezing or shortness of breath., Disp: , Rfl:    ferrous sulfate 324 MG TBEC, Take 324 mg by mouth., Disp: , Rfl:    MELATONIN PO, Take 1 tablet by mouth daily as needed (sleep)., Disp: , Rfl:    Menthol, Topical Analgesic, (BIOFREEZE ROLL-ON EX), Apply topically., Disp: , Rfl:    metoprolol tartrate (LOPRESSOR) 25 MG tablet, Take 1 tablet (25 mg total) by mouth 2 (two) times daily., Disp: 60 tablet, Rfl: 11   nitrofurantoin, macrocrystal-monohydrate, (MACROBID) 100 MG capsule, Take 1 capsule (100 mg total) by mouth 2 (two) times daily. 1 po BId, Disp: 14  capsule, Rfl: 0   ondansetron (ZOFRAN) 4 MG tablet, Take 1 tablet (4 mg total) by mouth every 8 (eight) hours as needed for nausea or vomiting., Disp: 30 tablet, Rfl: 1   pantoprazole (PROTONIX) 40 MG tablet, TAKE ONE TABLET TWICE DAILY BEFORE MEALS, Disp: 15 tablet, Rfl: 0   Medications ordered in this encounter:  Meds ordered this encounter  Medications   nirmatrelvir/ritonavir (PAXLOVID) 20 x 150 MG & 10 x 100MG  TABS    Sig: Take 3 tablets by mouth 2 (two) times daily for 5 days. (Take nirmatrelvir 150 mg two tablets twice daily for 5 days and ritonavir 100 mg one tablet twice daily for 5 days) Patient GFR is 89    Dispense:  30 tablet    Refill:  0    Order Specific Question:   Supervising Provider    Answer:   Chase Picket [[id number]]   benzonatate (TESSALON) 100 MG capsule    Sig: Take 1-2 caps PO TID PRN    Dispense:  20 capsule    Refill:  0    Order Specific Question:   Supervising Provider    Answer:   Chase Picket [id number]     *If you need refills on other medications prior to your next appointment, please contact your pharmacy*  Follow-Up: Call back or seek an in-person evaluation if the symptoms worsen or if the condition fails to improve as anticipated.  [place] [phone])  [phone]  Other Instructions Infection Prevention in the Home If you have an infection, may have been exposed to an infection, or are taking care of someone who has an infection, it is important to know how to keep the infection from spreading. Follow your health care provider's instructions and use these guidelines to help stop the spread of infection. How infections are spread In order for an infection to spread, the following must be present: A germ. This may be a virus, bacteria, fungus, or parasite. A place for the germ to live. This may be: On or in a person, animal, plant, or food. In soil or water. On surfaces, such as a door handle. A person or animal who can develop  a disease if the germ enters the body (host). The host does not have resistance to the germ. A way for the germ to enter the host. This may occur by: Direct contact with an infected person or animal. This can happen through shaking hands or hugging. Some germs can also travel through the air and spread to others. This can happen when an infected person coughs or sneezes on or near other people. Indirect contact. This occurs when the germ enters the host through contact with an infected object. Examples include: Eating or drinking food or water that is contaminated with the germ. Touching a contaminated surface with your hands, and then touching your face, eyes, nose, or mouth. Supplies needed: Soap. Alcohol-based hand sanitizer. Standard cleaning products. Disinfectants, such as bleach. Reusable cleaning cloths, sponges, or paper towels. Disposable or reusable utility gloves. How to prevent infection from spreading There are several things that you can do to help prevent infection from spreading. Take these general actions Everyone should take the following actions to prevent the spread of infection: Wash your hands often with soap and water for at least 20 seconds. If soap and water are not available, use alcohol-based hand sanitizer. Avoid touching your face, mouth, nose, or eyes. Cough or sneeze into a tissue, sleeve, or elbow instead of into your hand or into the air. If you cough or sneeze into a tissue, throw it away immediately and wash your hands.  Keep your bathroom clean Provide soap. Change towels and washcloths frequently. Change toothbrushes often and store them separately in a clean, dry place. Clean and disinfect all surfaces, including the toilet, floor, tub, shower, and sink. Do not share personal items, such as razors, toothbrushes, deodorant, combs, brushes, towels, and washcloths. Maintain hygiene in the [facility] your hands before and after preparing food and  before you eat. Clean the inside of your refrigerator each week. Keep your refrigerator set at 28F (4C) or less, and set your freezer at 46F (-18C) or less. Keep work surfaces clean. Disinfect them regularly. Wash your dishes in hot, soapy water. Air-dry your dishes or use a dishwasher. Do not share dishes or eating utensils. Handle food safely Store food carefully. Refrigerate leftovers promptly in covered containers. Throw out stale or spoiled food. Thaw foods in the refrigerator or microwave, not at room temperature. Serve foods at the proper temperature. Do not eat raw meat. Make sure it is cooked to the appropriate temperature. Cook eggs until they are firm. Wash fruits and vegetables under running water. Use separate cutting boards, plates, and utensils for raw foods and cooked foods. Use a clean spoon each time you sample food while cooking. Do laundry the right way Wear gloves if laundry is visibly soiled. Do not shake soiled laundry.  Doing that may send germs into the air. Wash laundry in hot water. If you cannot wash the laundry right away, place it in a plastic bag and wash it as soon as possible. Be careful around animals and pets Wash your hands before and after touching animals. If you have a pet, ensure that your pet stays clean. Do not let people with weak immune systems touch bird droppings, fish tank water, or a litter box. If you have a pet cage or litter box, be sure to clean it every day. If you are sick, stay away from animals and have someone else care for them if possible. How to clean and disinfect objects and surfaces Precautions Some disinfectants work for certain germs and not others. Read the manufacturer's instructions or read online resources to determine if the product you are using will work for the germ you are trying to remove. If you choose to use bleach, use it safely. Never mix it with other cleaning products, especially those that contain ammonia.  This mixture can create a dangerous gas that may be deadly. Keep proper movement of fresh air in your home (ventilation). Pour used mop water down the utility sink or toilet. Do not pour this water down the kitchen sink. Objects and surfaces  If surfaces are visibly soiled, clean them first with soap and water before disinfecting. Disinfect surfaces that are frequently touched every day. This may include: Counters. Tables. Doorknobs. Sinks and faucets. Electronics, such as: Engineer, technical sales. Remote controls. Keyboards. Computers and tablets. Cleaning supplies Some cleaning supplies can breed germs. Take good care of them to prevent germs from spreading. To do this: Soak toilet brushes, mops, and sponges in bleach and water for 5 minutes after each use, or according to manufacturer's instructions. Wash reusable cleaning cloths and sanitize sponges after each use. Throw away disposable gloves after one use. Replace reusable utility gloves if they are cracked or torn or if they start to peel. Additional actions if you are sick If you live with other people:  Avoid close contact with those around you. Stay at least 3 ft (1 m) away from others, if possible. Use a separate bathroom, if possible. If possible, sleep in a separate bedroom or in a separate bed to prevent infecting other household members. Change bedroom linens each week or whenever they are soiled. Have everyone in the household wash hands often with soap and water for at least 20 seconds. If soap and water are not available, use alcohol-based hand sanitizer. In general: Stay home except to get medical care. Call ahead before visiting your health care provider. Ask others to get groceries and household supplies and to refill prescriptions for you. Avoid public areas. Try not to take public transportation. If you can, wear a mask if you need to go out of the house, or if you are in close contact with someone who is not sick. Avoid  visitors until you have completely recovered, or until you have no signs and symptoms of infection. Avoid preparing food or providing care for others. If you must prepare food or provide care for others, wear a mask and wash your hands before and after doing these things. Where to find more information Centers for Disease Control and Prevention: TonerPromos.no Summary It is important to know how to keep infection from spreading. Make sure everyone in your household washes their hands often with soap and water. Disinfect surfaces that are frequently touched every day. If you are sick, stay home except  to get medical care. This information is not intended to replace advice given to you by your health care provider. Make sure you discuss any questions you have with your health care provider. Document Revised: 10/08/2021 Document Reviewed: 10/08/2021 Elsevier Patient Education  2023 Elsevier Inc.    If you have been instructed to have an in-person evaluation today at a local Urgent Care facility, please use the link below. It will take you to a list of all of our available [facility], including address, phone number and hours of operation. Please do not delay care.  [facility]  If you or a family member do not have a primary care provider, use the link below to schedule a visit and establish care. When you choose a [facility] primary care physician or advanced practice provider, you gain a long-term partner in health. Find a Primary Care Provider  Learn more about [facility]'s in-office and virtual care options: [facility] - Get Care Now

## 2022-09-28 NOTE — Progress Notes (Signed)
Virtual Visit Consent   [name], you are scheduled for a virtual visit with a [facility] provider today. Just as with appointments in the office, your consent must be obtained to participate. Your consent will be active for this visit and any virtual visit you may have with one of our providers in the next 365 days. If you have a MyChart account, a copy of this consent can be sent to you electronically.  As this is a virtual visit, video technology does not allow for your provider to perform a traditional examination. This may limit your provider's ability to fully assess your condition. If your provider identifies any concerns that need to be evaluated in person or the need to arrange testing (such as labs, EKG, etc.), we will make arrangements to do so. Although advances in technology are sophisticated, we cannot ensure that it will always work on either your end or our end. If the connection with a video visit is poor, the visit may have to be switched to a telephone visit. With either a video or telephone visit, we are not always able to ensure that we have a secure connection.  By engaging in this virtual visit, you consent to the provision of healthcare and authorize for your insurance to be billed (if applicable) for the services provided during this visit. Depending on your insurance coverage, you may receive a charge related to this service.  I need to obtain your verbal consent now. Are you willing to proceed with your visit today? [name] has provided verbal consent on 09/28/2022 for a virtual visit (video or telephone). Tami Rad, PA-C  Date: 09/28/2022 10:13 AM  Virtual Visit via Video Note   I, [name], connected with  [name]  ([id number], [birth year]) on 09/28/22 at 10:00 AM EST by a video-enabled telemedicine application and verified that I am speaking with the correct person using two identifiers.  Location: Patient: Virtual Visit Location  Patient: Home Provider: Virtual Visit Location Provider: Home Office   I discussed the limitations of evaluation and management by telemedicine and the availability of in person appointments. The patient expressed understanding and agreed to proceed.    History of Present Illness: [name] is a 52 y.o. who identifies as a female who was assigned female at birth, and is being seen today for positive home COVID test.  States that she started having headache, dry cough, sore throat,  diarrhea, body aches last night.  States that she took a home COVID test which was positive.  States that she has not had anything to eat or drink yet today, has been experiencing a small amount of nausea.  States that she has been using Tylenol with a small amount of relief.  States that she works at [employer] and is exposed to many sick contacts.   Review of Systems  Constitutional:  Negative for chills and fever.  HENT:  Positive for congestion and sore throat.   Eyes: Negative.   Respiratory:  Positive for shortness of breath.   Cardiovascular:  Negative for chest pain.  Gastrointestinal:  Positive for nausea. Negative for vomiting.  Genitourinary: Negative.   Musculoskeletal:  Positive for myalgias.  Skin: Negative.   Neurological:  Positive for headaches.  Endo/Heme/Allergies: Negative.   Psychiatric/Behavioral: Negative.       Problems:  Patient Active Problem List   Diagnosis Date Noted   Fatigue 05/15/2022   Gastric ulcer 04/05/2022   Diarrhea 04/05/2022  Severe anemia 11/30/2021   Arthritis 11/28/2021   Severe iron deficiency anemia 11/28/2021   Hypoalbuminemia 11/28/2021   Hypomagnesemia 11/28/2021   Iron deficiency anemia    NSAID long-term use    Recent unexplained weight loss    Nausea without vomiting    Paroxysmal atrial fibrillation with RVR (HCC) 11/27/2021   Midline low back pain without sciatica 11/02/2015    Allergies:  Allergies  Allergen Reactions   Penicillins  Nausea And Vomiting   Medications:  Current Outpatient Medications:    benzonatate (TESSALON) 100 MG capsule, Take 1-2 caps PO TID PRN, Disp: 20 capsule, Rfl: 0   HYDROcodone-acetaminophen (NORCO) 7.5-325 MG tablet, Take 1 tablet by mouth every 6 (six) hours as needed for moderate pain (Must last 30 days)., Disp: 36 tablet, Rfl: 0   nirmatrelvir/ritonavir (PAXLOVID) 20 x 150 MG & 10 x 100MG  TABS, Take 3 tablets by mouth 2 (two) times daily for 5 days. (Take nirmatrelvir 150 mg two tablets twice daily for 5 days and ritonavir 100 mg one tablet twice daily for 5 days) Patient GFR is 89, Disp: 30 tablet, Rfl: 0   albuterol (VENTOLIN HFA) 108 (90 Base) MCG/ACT inhaler, Inhale 2 puffs into the lungs every 6 (six) hours as needed for wheezing or shortness of breath., Disp: , Rfl:    ferrous sulfate 324 MG TBEC, Take 324 mg by mouth., Disp: , Rfl:    MELATONIN PO, Take 1 tablet by mouth daily as needed (sleep)., Disp: , Rfl:    Menthol, Topical Analgesic, (BIOFREEZE ROLL-ON EX), Apply topically., Disp: , Rfl:    metoprolol tartrate (LOPRESSOR) 25 MG tablet, Take 1 tablet (25 mg total) by mouth 2 (two) times daily., Disp: 60 tablet, Rfl: 11   nitrofurantoin, macrocrystal-monohydrate, (MACROBID) 100 MG capsule, Take 1 capsule (100 mg total) by mouth 2 (two) times daily. 1 po BId, Disp: 14 capsule, Rfl: 0   ondansetron (ZOFRAN) 4 MG tablet, Take 1 tablet (4 mg total) by mouth every 8 (eight) hours as needed for nausea or vomiting., Disp: 30 tablet, Rfl: 1   pantoprazole (PROTONIX) 40 MG tablet, TAKE ONE TABLET TWICE DAILY BEFORE MEALS, Disp: 15 tablet, Rfl: 0  Observations/Objective: Patient is well-developed, well-nourished in no acute distress.  Resting comfortably  at home.  Head is normocephalic, atraumatic.  No labored breathing.  Speech is clear and coherent with logical content.  Patient is alert and oriented at baseline.    Assessment and Plan: 1. COVID - nirmatrelvir/ritonavir (PAXLOVID) 20  x 150 MG & 10 x 100MG  TABS; Take 3 tablets by mouth 2 (two) times daily for 5 days. (Take nirmatrelvir 150 mg two tablets twice daily for 5 days and ritonavir 100 mg one tablet twice daily for 5 days) Patient GFR is 89  Dispense: 30 tablet; Refill: 0 - benzonatate (TESSALON) 100 MG capsule; Take 1-2 caps PO TID PRN  Dispense: 20 capsule; Refill: 0  Trial Paxlovid, Tessalon Perles.  Patient education given on supportive care, red flags given for prompt reevaluation.  Follow Up Instructions: I discussed the assessment and treatment plan with the patient. The patient was provided an opportunity to ask questions and all were answered. The patient agreed with the plan and demonstrated an understanding of the instructions.  A copy of instructions were sent to the patient via MyChart unless otherwise noted below.     The patient was advised to call back or seek an in-person evaluation if the symptoms worsen or if the condition fails to improve  as anticipated.  Time:  I spent 15 minutes with the patient via telehealth technology discussing the above problems/concerns.    Loraine Grip Mayers, PA-C

## 2022-10-07 ENCOUNTER — Telehealth: Payer: Self-pay | Admitting: Orthopaedic Surgery

## 2022-10-07 MED ORDER — HYDROCODONE-ACETAMINOPHEN 7.5-325 MG PO TABS: 1.0000 | ORAL_TABLET | Freq: Four times a day (QID) | ORAL | 0 refills | Status: DC | PRN

## 2022-10-10 ENCOUNTER — Telehealth: Payer: BC Managed Care – PPO | Admitting: Orthopaedic Surgery

## 2022-10-16 ENCOUNTER — Encounter: Payer: Self-pay | Admitting: Orthopaedic Surgery

## 2022-10-16 ENCOUNTER — Ambulatory Visit (INDEPENDENT_AMBULATORY_CARE_PROVIDER_SITE_OTHER): Payer: BC Managed Care – PPO | Admitting: Orthopaedic Surgery

## 2022-10-16 DIAGNOSIS — M545 Low back pain, unspecified: Secondary | ICD-10-CM | POA: Diagnosis not present

## 2022-10-16 DIAGNOSIS — G8929 Other chronic pain: Secondary | ICD-10-CM

## 2022-10-16 NOTE — Progress Notes (Signed)
Virtual Visit via Telephone Note  I connected with [name] on 10/16/22 at  8:20 AM EST by telephone and verified that I am speaking with the correct person using two identifiers.  Location: Patient: home Provider: office   I discussed the limitations, risks, security and privacy concerns of performing an evaluation and management service by telephone and the availability of in person appointments. I also discussed with the patient that there may be a patient responsible charge related to this service. The patient expressed understanding and agreed to proceed.   History of Present Illness: She has good and bad days with her back. She has no weakness or new trauma. 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  She still has not had endoscopy but the GI problems are improving.  She cannot take NSAIDs.   Observations/Objective: Per above.  Assessment and Plan: Encounter Diagnosis  Name Primary?   Chronic midline low back pain without sciatica Yes     Follow Up Instructions: Three months.  Continue her exercises.  Call if any problem.   I discussed the assessment and treatment plan with the patient. The patient was provided an opportunity to ask questions and all were answered. The patient agreed with the plan and demonstrated an understanding of the instructions.   The patient was advised to call back or seek an in-person evaluation if the symptoms worsen or if the condition fails to improve as anticipated.  I provided 8 minutes of non-face-to-face time during this encounter.   Sanjuana Kava, MD

## 2022-10-31 ENCOUNTER — Encounter: Payer: Self-pay | Admitting: Radiology

## 2022-11-04 ENCOUNTER — Telehealth: Payer: Self-pay | Admitting: Orthopaedic Surgery

## 2022-11-05 MED ORDER — HYDROCODONE-ACETAMINOPHEN 7.5-325 MG PO TABS: 1.0000 | ORAL_TABLET | Freq: Four times a day (QID) | ORAL | 0 refills | Status: DC | PRN

## 2022-11-08 ENCOUNTER — Encounter: Payer: Self-pay | Admitting: Family Medicine

## 2022-11-08 ENCOUNTER — Ambulatory Visit: Payer: BC Managed Care – PPO | Admitting: Family Medicine

## 2022-11-08 VITALS — BP 114/78 | HR 62 | Temp 98.5°F | Ht 67.0 in | Wt 165.0 lb

## 2022-11-08 DIAGNOSIS — Z1331 Encounter for screening for depression: Secondary | ICD-10-CM

## 2022-11-08 DIAGNOSIS — R5383 Other fatigue: Secondary | ICD-10-CM | POA: Diagnosis not present

## 2022-11-08 DIAGNOSIS — D649 Anemia, unspecified: Secondary | ICD-10-CM | POA: Diagnosis not present

## 2022-11-08 DIAGNOSIS — M7989 Other specified soft tissue disorders: Secondary | ICD-10-CM

## 2022-11-08 DIAGNOSIS — R202 Paresthesia of skin: Secondary | ICD-10-CM

## 2022-11-08 DIAGNOSIS — R809 Proteinuria, unspecified: Secondary | ICD-10-CM

## 2022-11-08 DIAGNOSIS — R200 Anesthesia of skin: Secondary | ICD-10-CM

## 2022-11-08 LAB — LIPID PANEL

## 2022-11-08 LAB — MICROSCOPIC EXAMINATION: Renal Epithel, UA: NONE SEEN /hpf

## 2022-11-08 LAB — URINALYSIS, ROUTINE W REFLEX MICROSCOPIC
Bilirubin, UA: NEGATIVE
Glucose, UA: NEGATIVE
Ketones, UA: NEGATIVE
Leukocytes,UA: NEGATIVE
Nitrite, UA: NEGATIVE
Specific Gravity, UA: 1.02 (ref 1.005–1.030)
Urobilinogen, Ur: 0.2 mg/dL (ref 0.2–1.0)
pH, UA: 6 (ref 5.0–7.5)

## 2022-11-08 NOTE — Progress Notes (Addendum)
Acute Office Visit  Subjective:  Patient ID: [name], female    DOB: 12-19-1970, 52 y.o.   MRN: [id number]  Chief Complaint  Patient presents with   Medical Management of Chronic Issues    HPI Patient is in today for continued swelling of Hands with numbness and pain. Pt states that symptoms are associated with "bumps" on her hands that are itchy. There is associated numbness and tingling in her Right hand from her thumb- middle finger. Symptoms are not all the time. Happens every other week and lasts for about a week. Pain is worsening. Endorses a shooting pain through her hand with certain movements. Symptoms occur bilaterally but are worse in the right hand. in addition has bilateral lower leg edema. Has tried arthritis cream on her hands and it helped minimally. Tries to keep her legs elevated to reduce swelling.  Denies changes in urination, bowel movements. Denies saddle anesthesia. Continues to have fatigue. Denies fever .  Depression Screen positive on PHQ   Review of Systems  All other systems reviewed and are negative.  Objective:  BP 114/78   Pulse 62   Temp 98.5 F (36.9 C)   Ht '5\' 7"'$  (1.702 m)   Wt 165 lb (74.8 kg)   LMP 09/16/2014 Comment: NEG U PREG 1/312/16  SpO2 99%   BMI 25.84 kg/m    Physical Exam Constitutional:      General: She is not in acute distress.    Appearance: Normal appearance. She is not ill-appearing, toxic-appearing or diaphoretic.  Cardiovascular:     Rate and Rhythm: Normal rate.     Pulses: Normal pulses.     Heart sounds: Normal heart sounds. No murmur heard.    No gallop.  Pulmonary:     Effort: Pulmonary effort is normal. No respiratory distress.     Breath sounds: Normal breath sounds. No stridor. No wheezing, rhonchi or rales.  Skin:    General: Skin is warm.     Capillary Refill: Capillary refill takes less than 2 seconds.     Comments: Small single macular erythematous dry lesion on second finger of right hand    Neurological:     General: No focal deficit present.     Mental Status: She is alert and oriented to person, place, and time. Mental status is at baseline.     Motor: No weakness.     Comments: Decreased sensation to sharp on bilateral hands   Psychiatric:        Mood and Affect: Mood normal.        Behavior: Behavior normal.        Thought Content: Thought content normal.        Judgment: Judgment normal.       11/08/2022    9:44 AM 05/27/2022    8:06 AM  Depression screen PHQ 2/9  Decreased Interest 0 0  Down, Depressed, Hopeless 0 0  PHQ - 2 Score 0 0  Altered sleeping 3   Tired, decreased energy 3   Change in appetite 1   Feeling bad or failure about yourself  0   Trouble concentrating 0   Moving slowly or fidgety/restless 0   Suicidal thoughts 0   PHQ-9 Score 7   Difficult doing work/chores Somewhat difficult        11/08/2022    9:44 AM  GAD 7 : Generalized Anxiety Score  Nervous, Anxious, on Edge 0  Control/stop worrying 0  Worry too much - different  things 0  Trouble relaxing 0  Restless 0  Easily annoyed or irritable 0  Afraid - awful might happen 0  Total GAD 7 Score 0  Anxiety Difficulty Not difficult at all   Assessment & Plan:  1. Numbness and tingling in both hands 2. Swelling of both hands 3. Swelling of lower extremity Labs as below. Will communicate results to patient once available.  Will await for results and consider referral to reheumatology.  - Anemia Profile B - CMP14+EGFR - Lipid panel - VITAMIN D 25 Hydroxy (Vit-D Deficiency, Fractures) - ANA Comprehensive Panel - C-reactive protein - Urinalysis, Routine w reflex microscopic  Addendum: UA positive for protein, which can be indicative of SLE per Up to Date. Will await remaining labs.   4. Fatigue, unspecified type Labs as below. Will communicate results to patient once available. Will consider thyroid disease as a cause of symptoms.  - TSH - T4, Free  5. Severe anemia Pt has  history of severe anemia with hospitalization. Will recheck anemia panel as below.  - Anemia Profile B  6. Encounter for screening for depression Pt screened positive for depression today with PHQ-9. Pt declined initiating treatment at this time. Safety contract established today with patient in clinic. Denies intent to harm herself or others. Pt to notify provider if they would like to initiate treatment.   The above assessment and management plan was discussed with the patient. The patient verbalized understanding of and has agreed to the management plan using shared-decision making. Patient is aware to call the clinic if they develop any new symptoms or if symptoms fail to improve or worsen. Patient is aware when to return to the clinic for a follow-up visit. Patient educated on when it is appropriate to go to the emergency department.   Follow up 4 weeks for lab review and [place], DNP-FNP [facility] [address] [place], [place] [address] [phone]

## 2022-11-10 LAB — ANEMIA PROFILE B
Basophils Absolute: 0.1 10*3/uL (ref 0.0–0.2)
Basos: 1 %
EOS (ABSOLUTE): 0.2 10*3/uL (ref 0.0–0.4)
Eos: 2 %
Ferritin: 20 ng/mL (ref 15–150)
Folate: 5.1 ng/mL (ref >3.0)
Hematocrit: 31.8 % — ABNORMAL LOW (ref 34.0–46.6)
Hemoglobin: 9.5 g/dL — ABNORMAL LOW (ref 11.1–15.9)
Immature Grans (Abs): 0 10*3/uL (ref 0.0–0.1)
Immature Granulocytes: 0 %
Iron Saturation: 12 % — ABNORMAL LOW (ref 15–55)
Iron: 41 ug/dL (ref 27–159)
Lymphocytes Absolute: 2.2 10*3/uL (ref 0.7–3.1)
Lymphs: 29 %
MCH: 25.6 pg — ABNORMAL LOW (ref 26.6–33.0)
MCHC: 29.9 g/dL — ABNORMAL LOW (ref 31.5–35.7)
MCV: 86 fL (ref 79–97)
Monocytes Absolute: 0.4 10*3/uL (ref 0.1–0.9)
Monocytes: 5 %
Neutrophils Absolute: 4.6 10*3/uL (ref 1.4–7.0)
Neutrophils: 63 %
Platelets: 361 10*3/uL (ref 150–450)
RBC: 3.71 x10E6/uL — ABNORMAL LOW (ref 3.77–5.28)
RDW: 14.7 % (ref 11.7–15.4)
Retic Ct Pct: 1.1 % (ref 0.6–2.6)
Total Iron Binding Capacity: 351 ug/dL (ref 250–450)
UIBC: 310 ug/dL (ref 131–425)
Vitamin B-12: 601 pg/mL (ref 232–1245)
WBC: 7.4 10*3/uL (ref 3.4–10.8)

## 2022-11-10 LAB — ANA COMPREHENSIVE PANEL
Anti JO-1: <0.2 AI (ref 0.0–0.9)
Centromere Ab Screen: <0.2 AI (ref 0.0–0.9)
Chromatin Ab SerPl-aCnc: <0.2 AI (ref 0.0–0.9)
ENA RNP Ab: 0.4 AI (ref 0.0–0.9)
ENA SM Ab Ser-aCnc: <0.2 AI (ref 0.0–0.9)
ENA SSA (RO) Ab: <0.2 AI (ref 0.0–0.9)
ENA SSB (LA) Ab: <0.2 AI (ref 0.0–0.9)
Scleroderma (Scl-70) (ENA) Antibody, IgG: <0.2 AI (ref 0.0–0.9)
dsDNA Ab: <1 IU/mL (ref 0–9)

## 2022-11-10 LAB — CMP14+EGFR
ALT: 14 IU/L (ref 0–32)
AST: 16 IU/L (ref 0–40)
Albumin/Globulin Ratio: 1.4 (ref 1.2–2.2)
Albumin: 4.3 g/dL (ref 3.8–4.9)
Alkaline Phosphatase: 109 IU/L (ref 44–121)
BUN/Creatinine Ratio: 23 (ref 9–23)
BUN: 19 mg/dL (ref 6–24)
Bilirubin Total: 0.3 mg/dL (ref 0.0–1.2)
CO2: 21 mmol/L (ref 20–29)
Calcium: 9.7 mg/dL (ref 8.7–10.2)
Chloride: 106 mmol/L (ref 96–106)
Creatinine, Ser: 0.83 mg/dL (ref 0.57–1.00)
Globulin, Total: 3 g/dL (ref 1.5–4.5)
Glucose: 77 mg/dL (ref 70–99)
Potassium: 4.9 mmol/L (ref 3.5–5.2)
Sodium: 140 mmol/L (ref 134–144)
Total Protein: 7.3 g/dL (ref 6.0–8.5)
eGFR: 84 mL/min/{1.73_m2} (ref >59)

## 2022-11-10 LAB — LIPID PANEL
Chol/HDL Ratio: 3.1 ratio (ref 0.0–4.4)
Cholesterol, Total: 168 mg/dL (ref 100–199)
HDL: 55 mg/dL (ref >39)
LDL Chol Calc (NIH): 99 mg/dL (ref 0–99)
Triglycerides: 76 mg/dL (ref 0–149)
VLDL Cholesterol Cal: 14 mg/dL (ref 5–40)

## 2022-11-10 LAB — TSH: TSH: 2.58 u[IU]/mL (ref 0.450–4.500)

## 2022-11-10 LAB — VITAMIN D 25 HYDROXY (VIT D DEFICIENCY, FRACTURES): Vit D, 25-Hydroxy: 43.3 ng/mL (ref 30.0–100.0)

## 2022-11-10 LAB — C-REACTIVE PROTEIN: CRP: 1 mg/L (ref 0–10)

## 2022-11-10 LAB — T4, FREE: Free T4: 1.1 ng/dL (ref 0.82–1.77)

## 2022-11-13 ENCOUNTER — Ambulatory Visit: Payer: BC Managed Care – PPO | Admitting: Family Medicine

## 2022-11-13 ENCOUNTER — Ambulatory Visit: Payer: BC Managed Care – PPO | Admitting: Nurse Practitioner

## 2022-11-13 NOTE — Addendum Note (Signed)
Addended by: Donzetta Kohut on: 11/13/2022 10:18 AM   Modules accepted: Orders

## 2022-11-15 ENCOUNTER — Other Ambulatory Visit: Payer: BC Managed Care – PPO

## 2022-11-15 DIAGNOSIS — R809 Proteinuria, unspecified: Secondary | ICD-10-CM | POA: Diagnosis not present

## 2022-11-15 LAB — URINALYSIS, ROUTINE W REFLEX MICROSCOPIC
Bilirubin, UA: NEGATIVE
Glucose, UA: NEGATIVE
Ketones, UA: NEGATIVE
Leukocytes,UA: NEGATIVE
Nitrite, UA: NEGATIVE
Specific Gravity, UA: 1.02 (ref 1.005–1.030)
Urobilinogen, Ur: 0.2 mg/dL (ref 0.2–1.0)
pH, UA: 6 (ref 5.0–7.5)

## 2022-11-15 LAB — MICROSCOPIC EXAMINATION
Bacteria, UA: NONE SEEN
Renal Epithel, UA: NONE SEEN /hpf
WBC, UA: NONE SEEN /hpf (ref 0–5)

## 2022-11-17 LAB — URINE CULTURE

## 2022-11-18 ENCOUNTER — Other Ambulatory Visit: Payer: BC Managed Care – PPO

## 2022-11-18 DIAGNOSIS — R809 Proteinuria, unspecified: Secondary | ICD-10-CM | POA: Diagnosis not present

## 2022-11-19 LAB — PROTEIN, URINE, 24 HOUR
Protein, 24H Urine: 1100 mg/24 hr — ABNORMAL HIGH (ref 30–150)
Protein, Ur: 44 mg/dL

## 2022-12-03 ENCOUNTER — Telehealth: Payer: Self-pay | Admitting: Orthopaedic Surgery

## 2022-12-04 ENCOUNTER — Ambulatory Visit: Payer: BC Managed Care – PPO | Admitting: Gastroenterology

## 2022-12-04 MED ORDER — HYDROCODONE-ACETAMINOPHEN 7.5-325 MG PO TABS: 1.0000 | ORAL_TABLET | Freq: Four times a day (QID) | ORAL | 0 refills | Status: DC | PRN

## 2022-12-13 ENCOUNTER — Ambulatory Visit (INDEPENDENT_AMBULATORY_CARE_PROVIDER_SITE_OTHER): Payer: BC Managed Care – PPO | Admitting: Family Medicine

## 2022-12-13 ENCOUNTER — Encounter: Payer: Self-pay | Admitting: Family Medicine

## 2022-12-13 VITALS — BP 100/65 | Temp 98.5°F | Ht 67.0 in | Wt 169.0 lb

## 2022-12-13 DIAGNOSIS — Z0001 Encounter for general adult medical examination with abnormal findings: Secondary | ICD-10-CM

## 2022-12-13 DIAGNOSIS — R21 Rash and other nonspecific skin eruption: Secondary | ICD-10-CM | POA: Diagnosis not present

## 2022-12-13 DIAGNOSIS — M2550 Pain in unspecified joint: Secondary | ICD-10-CM

## 2022-12-13 DIAGNOSIS — Z0000 Encounter for general adult medical examination without abnormal findings: Secondary | ICD-10-CM

## 2022-12-13 DIAGNOSIS — D508 Other iron deficiency anemias: Secondary | ICD-10-CM | POA: Diagnosis not present

## 2022-12-13 NOTE — Progress Notes (Signed)
[name] is a 52 y.o. female presents to office today for annual physical exam examination.    Concerns today include: 1. Started taking iron supplement. Taking one daily. Some constipation.  Occupation: Statistician, Orthoptist  Marital status: none, dog Wyvonnia Dusky  Diet: subs, salads, cut out sodas, drinking flavored water packets.  Exercise: none, standing for work all day  Substance use: none Last eye exam: [facility], due  Last dental exam: none declines resources  Last colonoscopy: has appt with GI next Friday  Last mammogram: decline  Last pap smear: declines for now  Contraceptive: postmenopausal  Refills needed today: none  Other specialists seen: orthopedist in [place], referral for nephrology placed, has not heard back yet.  Fasting today:  no  Immunizations needed: Flu Vaccine: no  Tdap Vaccine: no  - every 64yrs - (<3 lifetime doses or unknown): all wounds -- look up need for Tetanus IG - (>=3 lifetime doses): clean/minor wound if >61yrs from previous; all other wounds if >69yrs from previous Zoster Vaccine: no (those >50yo, once) Declines  Pneumonia Vaccine: no (those w/ risk factors) - (<26yr) Both: Immunocompromised, cochlear implant, CSF leak, asplenic, sickle cell, Chronic Renal Failure - (<74yr) PPSV-23 only: Heart dz, lung disease, DM, tobacco abuse, alcoholism, cirrhosis/liver disease. - (>20yr): PPSV13 then PPSV23 in 6-12mths;  - (>39yr): repeat PPSV23 once if pt received prior to 52yo and 25yrs have passed   Past Medical History:  Diagnosis Date   Anemia    Arthritis    Chronic back pain    PAF (paroxysmal atrial fibrillation)    a. s/p DCCV in 10/2021   Social History   Socioeconomic History   Marital status: Legally Separated    Spouse name: Not on file   Number of children: Not on file   Years of education: Not on file   Highest education level: Not on file  Occupational History   Not on file  Tobacco Use    Smoking status: Former    Packs/day: 0.50    Years: 20.00    Additional pack years: 0.00    Total pack years: 10.00    Types: Cigarettes    Quit date: 11/29/2021    Years since quitting: 1.0   Smokeless tobacco: Never  Vaping Use   Vaping Use: Former  Substance and Sexual Activity   Alcohol use: No   Drug use: No   Sexual activity: Yes    Birth control/protection: None, Surgical  Other Topics Concern   Not on file  Social History Narrative   Not on file   Social Determinants of Health   Financial Resource Strain: Not on file  Food Insecurity: Not on file  Transportation Needs: Not on file  Physical Activity: Not on file  Stress: Not on file  Social Connections: Not on file  Intimate Partner Violence: Not on file   Past Surgical History:  Procedure Laterality Date   BIOPSY  11/29/2021   Procedure: BIOPSY;  Surgeon: Corbin Ade, MD;  Location: [facility];  Service: Endoscopy;;   CHOLECYSTECTOMY     ESOPHAGOGASTRODUODENOSCOPY (EGD) WITH PROPOFOL N/A 11/29/2021   Surgeon: Corbin Ade, MD; extensive gastric ulceration extending into the duodenal bulb with secondary pyloric stenosis and retained gastric contents.  Pyloric channel dilated nicely with scope passage only. Gastric mucosa was biopsied and negative for H. pylori, malignancy, dysplasia.   REPLACEMENT TOTAL KNEE BILATERAL     TUBAL LIGATION     Family History  Problem Relation Age of Onset   Diabetes Mother    Stroke Father    Colon cancer Neg Hx    Inflammatory bowel disease Neg Hx     Current Outpatient Medications:    albuterol (VENTOLIN HFA) 108 (90 Base) MCG/ACT inhaler, Inhale 2 puffs into the lungs every 6 (six) hours as needed for wheezing or shortness of breath., Disp: , Rfl:    ferrous sulfate 324 MG TBEC, Take 324 mg by mouth., Disp: , Rfl:    HYDROcodone-acetaminophen (NORCO) 7.5-325 MG tablet, Take 1 tablet by mouth every 6 (six) hours as needed for moderate pain (Must last 30 days).,  Disp: 36 tablet, Rfl: 0   MELATONIN PO, Take 1 tablet by mouth daily as needed (sleep)., Disp: , Rfl:    Menthol, Topical Analgesic, (BIOFREEZE ROLL-ON EX), Apply topically., Disp: , Rfl:    metoprolol tartrate (LOPRESSOR) 25 MG tablet, Take 1 tablet (25 mg total) by mouth 2 (two) times daily., Disp: 60 tablet, Rfl: 11   ondansetron (ZOFRAN) 4 MG tablet, Take 1 tablet (4 mg total) by mouth every 8 (eight) hours as needed for nausea or vomiting., Disp: 30 tablet, Rfl: 1   pantoprazole (PROTONIX) 40 MG tablet, TAKE ONE TABLET TWICE DAILY BEFORE MEALS, Disp: 15 tablet, Rfl: 0  Allergies  Allergen Reactions   Penicillins Nausea And Vomiting     ROS: Review of Systems Review of Systems  Constitutional:  Positive for malaise/fatigue.  Skin:  Positive for rash.     Physical exam    12/13/2022    9:05 AM 11/08/2022    9:39 AM 05/27/2022    8:12 AM  Vitals with BMI  Height     Weight 169 lbs 165 lbs 137 lbs  BMI 26.46 25.84 21.45  Systolic 100 114 90  Diastolic 65 78 66  Pulse  62 91    Physical Exam Constitutional:      General: [name] is not in acute distress.    Appearance: Normal appearance. [name] is well-groomed and overweight. [name] is not ill-appearing, toxic-appearing or diaphoretic.  HENT:     Right Ear: Tympanic membrane and ear canal normal. No swelling or tenderness. Tympanic membrane is not injected, scarred, perforated, erythematous, retracted or bulging.     Left Ear: Tympanic membrane and ear canal normal. Tympanic membrane is not injected, scarred, perforated, erythematous, retracted or bulging.     Nose: Nose normal.     Mouth/Throat:     Mouth: Mucous membranes are moist.  Eyes:     General: Lids are normal.     Extraocular Movements: Extraocular movements intact.     Conjunctiva/sclera: Conjunctivae normal.     Pupils: Pupils are equal, round, and reactive to light.  Neck:     Trachea: Trachea normal.  Cardiovascular:     Rate and Rhythm: Normal rate  and regular rhythm.     Pulses: Normal pulses.          Radial pulses are 2+ on the right side and 2+ on the left side.       Posterior tibial pulses are 2+ on the right side and 2+ on the left side.     Heart sounds: Normal heart sounds. No murmur heard.    No gallop.  Pulmonary:     Effort: Pulmonary effort is normal.     Breath sounds: Normal breath sounds. No decreased breath sounds.  Chest:     Comments: Deferred Abdominal:     General:  Abdomen is flat. Bowel sounds are normal. There is no distension. There are no signs of injury.     Palpations: Abdomen is soft.     Tenderness: There is no abdominal tenderness.  Genitourinary:    Comments: Patient declined  Musculoskeletal:        General: Normal range of motion.     Cervical back: Normal range of motion.     Right lower leg: No edema.     Left lower leg: No edema.  Lymphadenopathy:     Cervical: Cervical adenopathy present.  Skin:    General: Skin is warm.     Capillary Refill: Capillary refill takes less than 2 seconds.  Neurological:     General: No focal deficit present.     Mental Status: [name] is alert.  Psychiatric:        Attention and Perception: Attention and perception normal.        Mood and Affect: Mood and affect normal.        Speech: Speech normal.        Behavior: Behavior normal. Behavior is cooperative.        Thought Content: Thought content normal.        Cognition and Memory: Cognition and memory normal.        Judgment: Judgment normal.     Assessment/ Plan: [name] here for annual physical exam.  Routine general medical examination at a health care facility Discussed with patient to continue healthy lifestyle choices, including diet (rich in fruits, vegetables, and lean proteins, and low in salt and simple carbohydrates) and exercise (at least 30 minutes of moderate physical activity daily). Limit beverages high is sugar. Recommended at least 80-100 oz of water daily.   Patient has  follow up with GI next Friday for follow up EGD/Colonoscopy.   Generalized joint pain Rash We have completed multiple labs for rheum/autoimmune etiology of joint pain with rash. Patient found to have proteinuria and has authorized referral to Wolfgang Phoenix, MD with nephrology for follow up. I have contacted [facility] and asked if there are additional labs that he would like in preparation for their visit. Will continue to monitor.   Anemia  Will repeat labs in 2 months to monitor anemia. Continue current regimen.    Patient to follow up in 1 year for annual exam or sooner if needed.  The above assessment and management plan was discussed with the patient. The patient verbalized understanding of and has agreed to the management plan. Patient is aware to call the clinic if symptoms persist or worsen. Patient is aware when to return to the clinic for a follow-up visit. Patient educated on when it is appropriate to go to the emergency department.   Neale Burly, DNP-FNP [facility] [address] [place], [place] [address] [phone]

## 2022-12-13 NOTE — Patient Instructions (Addendum)
[address] [place], [place] [address] [phone]  Health Maintenance, Female Adopting a healthy lifestyle and getting preventive care are important in promoting health and wellness. Ask your health care provider about: The right schedule for you to have regular tests and exams. Things you can do on your own to prevent diseases and keep yourself healthy. What should I know about diet, weight, and exercise? Eat a healthy diet  Eat a diet that includes plenty of vegetables, fruits, low-fat dairy products, and lean protein. Do not eat a lot of foods that are high in solid fats, added sugars, or sodium. Maintain a healthy weight Body mass index (BMI) is used to identify weight problems. It estimates body fat based on height and weight. Your health care provider can help determine your BMI and help you achieve or maintain a healthy weight. Get regular exercise Get regular exercise. This is one of the most important things you can do for your health. Most adults should: Exercise for at least 150 minutes each week. The exercise should increase your heart rate and make you sweat (moderate-intensity exercise). Do strengthening exercises at least twice a week. This is in addition to the moderate-intensity exercise. Spend less time sitting. Even light physical activity can be beneficial. Watch cholesterol and blood lipids Have your blood tested for lipids and cholesterol at 53 years of age, then have this test every 5 years. Have your cholesterol levels checked more often if: Your lipid or cholesterol levels are high. You are older than 53 years of age. You are at high risk for heart disease. What should I know about cancer screening? Depending on your health history and family history, you may need to have cancer screening at various ages. This may include screening for: Breast cancer. Cervical cancer. Colorectal cancer. Skin cancer. Lung cancer. What should I know about heart disease,  diabetes, and high blood pressure? Blood pressure and heart disease High blood pressure causes heart disease and increases the risk of stroke. This is more likely to develop in people who have high blood pressure readings or are overweight. Have your blood pressure checked: Every 3-5 years if you are 53-21 years of age. Every year if you are 53 years old or older. Diabetes Have regular diabetes screenings. This checks your fasting blood sugar level. Have the screening done: Once every three years after age 53 if you are at a normal weight and have a low risk for diabetes. More often and at a younger age if you are overweight or have a high risk for diabetes. What should I know about preventing infection? Hepatitis B If you have a higher risk for hepatitis B, you should be screened for this virus. Talk with your health care provider to find out if you are at risk for hepatitis B infection. Hepatitis C Testing is recommended for: Everyone born from [birth year] through 1966. Anyone with known risk factors for hepatitis C. Sexually transmitted infections (STIs) Get screened for STIs, including gonorrhea and chlamydia, if: You are sexually active and are younger than 53 years of age. You are older than 53 years of age and your health care provider tells you that you are at risk for this type of infection. Your sexual activity has changed since you were last screened, and you are at increased risk for chlamydia or gonorrhea. Ask your health care provider if you are at risk. Ask your health care provider about whether you are at high risk for HIV. Your health care provider may  recommend a prescription medicine to help prevent HIV infection. If you choose to take medicine to prevent HIV, you should first get tested for HIV. You should then be tested every 3 months for as long as you are taking the medicine. Pregnancy If you are about to stop having your period (premenopausal) and you may become pregnant,  seek counseling before you get pregnant. Take 400 to 800 micrograms (mcg) of folic acid every day if you become pregnant. Ask for birth control (contraception) if you want to prevent pregnancy. Osteoporosis and menopause Osteoporosis is a disease in which the bones lose minerals and strength with aging. This can result in bone fractures. If you are 29 years old or older, or if you are at risk for osteoporosis and fractures, ask your health care provider if you should: Be screened for bone loss. Take a calcium or vitamin D supplement to lower your risk of fractures. Be given hormone replacement therapy (HRT) to treat symptoms of menopause. Follow these instructions at home: Alcohol use Do not drink alcohol if: Your health care provider tells you not to drink. You are pregnant, may be pregnant, or are planning to become pregnant. If you drink alcohol: Limit how much you have to: 0-1 drink a day. Know how much alcohol is in your drink. In the [place] one drink equals one 12 oz bottle of beer (355 mL), one 5 oz glass of wine (148 mL), or one 1 oz glass of hard liquor (44 mL). Lifestyle Do not use any products that contain nicotine or tobacco. These products include cigarettes, chewing tobacco, and vaping devices, such as e-cigarettes. If you need help quitting, ask your health care provider. Do not use street drugs. Do not share needles. Ask your health care provider for help if you need support or information about quitting drugs. General instructions Schedule regular health, dental, and eye exams. Stay current with your vaccines. Tell your health care provider if: You often feel depressed. You have ever been abused or do not feel safe at home. Summary Adopting a healthy lifestyle and getting preventive care are important in promoting health and wellness. Follow your health care provider's instructions about healthy diet, exercising, and getting tested or screened for diseases. Follow your  health care provider's instructions on monitoring your cholesterol and blood pressure. This information is not intended to replace advice given to you by your health care provider. Make sure you discuss any questions you have with your health care provider. Document Revised: 01/08/2021 Document Reviewed: 01/08/2021 Elsevier Patient Education  2023 [employer].

## 2022-12-19 NOTE — Progress Notes (Deleted)
GI Office Note    Referring Provider: Arrie Senate* Primary Care Physician:  Arrie Senate, FNP  Primary Gastroenterologist: Roetta Sessions, MD   Chief Complaint   No chief complaint on file.    History of Present Illness   [name] is a 52 y.o. female presenting today for follow up. Last seen 04/2023. 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 Pyloric channel dilated with passage of the scope. No h.pylori. History of daily Aleve and diclofenac in setting of Eliquis. Required 2 units of prbcs.         Medications   Current Outpatient Medications  Medication Sig Dispense Refill   albuterol (VENTOLIN HFA) 108 (90 Base) MCG/ACT inhaler Inhale 2 puffs into the lungs every 6 (six) hours as needed for wheezing or shortness of breath.     ferrous sulfate 324 MG TBEC Take 324 mg by mouth.     HYDROcodone-acetaminophen (NORCO) 7.5-325 MG tablet Take 1 tablet by mouth every 6 (six) hours as needed for moderate pain (Must last 30 days). 36 tablet 0   MELATONIN PO Take 1 tablet by mouth daily as needed (sleep).     Menthol, Topical Analgesic, (BIOFREEZE ROLL-ON EX) Apply topically.     metoprolol tartrate (LOPRESSOR) 25 MG tablet Take 1 tablet (25 mg total) by mouth 2 (two) times daily. 60 tablet 11   ondansetron (ZOFRAN) 4 MG tablet Take 1 tablet (4 mg total) by mouth every 8 (eight) hours as needed for nausea or vomiting. 30 tablet 1   pantoprazole (PROTONIX) 40 MG tablet TAKE ONE TABLET TWICE DAILY BEFORE MEALS 15 tablet 0   No current facility-administered medications for this visit.    Allergies   Allergies as of 12/20/2022 - Review Complete 12/13/2022  Allergen Reaction Noted   Penicillins Nausea And Vomiting 07/31/2011    Past Medical History   Past Medical History:  Diagnosis Date   Anemia    Arthritis    Chronic back pain    PAF (paroxysmal atrial  fibrillation)    a. s/p DCCV in 10/2021    Past Surgical History   Past Surgical History:  Procedure Laterality Date   BIOPSY  11/29/2021   Procedure: BIOPSY;  Surgeon: Corbin Ade, MD;  Location: [facility];  Service: Endoscopy;;   CHOLECYSTECTOMY     ESOPHAGOGASTRODUODENOSCOPY (EGD) WITH PROPOFOL N/A 11/29/2021   Surgeon: Corbin Ade, MD; extensive gastric ulceration extending into the duodenal bulb with secondary pyloric stenosis and retained gastric contents.  Pyloric channel dilated nicely with scope passage only. Gastric mucosa was biopsied and negative for H. pylori, malignancy, dysplasia.   REPLACEMENT TOTAL KNEE BILATERAL     TUBAL LIGATION      Past Family History   Family History  Problem Relation Age of Onset   Diabetes Mother    Stroke Father    Colon cancer Neg Hx    Inflammatory bowel disease Neg Hx     Past Social History   Social History   Socioeconomic History   Marital status: Legally Separated    Spouse name: Not on file   Number of children: Not on file   Years of education: Not on file   Highest education level: Not on file  Occupational History   Not on file  Tobacco Use   Smoking status: Former    Packs/day: 0.50    Years: 20.00  Additional pack years: 0.00    Total pack years: 10.00    Types: Cigarettes    Quit date: 11/29/2021    Years since quitting: 1.0   Smokeless tobacco: Never  Vaping Use   Vaping Use: Former  Substance and Sexual Activity   Alcohol use: No   Drug use: No   Sexual activity: Yes    Birth control/protection: None, Surgical  Other Topics Concern   Not on file  Social History Narrative   Not on file   Social Determinants of Health   Financial Resource Strain: Not on file  Food Insecurity: Not on file  Transportation Needs: Not on file  Physical Activity: Not on file  Stress: Not on file  Social Connections: Not on file  Intimate Partner Violence: Not on file    Review of Systems    General: Negative for anorexia, weight loss, fever, chills, fatigue, weakness. Eyes: Negative for vision changes.  ENT: Negative for hoarseness, difficulty swallowing , nasal congestion. CV: Negative for chest pain, angina, palpitations, dyspnea on exertion, peripheral edema.  Respiratory: Negative for dyspnea at rest, dyspnea on exertion, cough, sputum, wheezing.  GI: See history of present illness. GU:  Negative for dysuria, hematuria, urinary incontinence, urinary frequency, nocturnal urination.  MS: Negative for joint pain, low back pain.  Derm: Negative for rash or itching.  Neuro: Negative for weakness, abnormal sensation, seizure, frequent headaches, memory loss,  confusion.  Psych: Negative for anxiety, depression, suicidal ideation, hallucinations.  Endo: Negative for unusual weight change.  Heme: Negative for bruising or bleeding. Allergy: Negative for rash or hives.  Physical Exam   LMP 09/16/2014 Comment: NEG U PREG 1/312/16   General: Well-nourished, well-developed in no acute distress.  Head: Normocephalic, atraumatic.   Eyes: Conjunctiva pink, no icterus. Mouth: Oropharyngeal mucosa moist and pink , no lesions erythema or exudate. Neck: Supple without thyromegaly, masses, or lymphadenopathy.  Lungs: Clear to auscultation bilaterally.  Heart: Regular rate and rhythm, no murmurs rubs or gallops.  Abdomen: Bowel sounds are normal, nontender, nondistended, no hepatosplenomegaly or masses,  no abdominal bruits or hernia, no rebound or guarding.   Rectal: *** Extremities: No lower extremity edema. No clubbing or deformities.  Neuro: Alert and oriented x 4 , grossly normal neurologically.  Skin: Warm and dry, no rash or jaundice.   Psych: Alert and cooperative, normal mood and affect.  Labs   Lab Results  Component Value Date   CREATININE 0.83 11/08/2022   BUN 19 11/08/2022   NA 140 11/08/2022   K 4.9 11/08/2022   CL 106 11/08/2022   CO2 21 11/08/2022   Lab  Results  Component Value Date   WBC 7.4 11/08/2022   HGB 9.5 (L) 11/08/2022   HCT 31.8 (L) 11/08/2022   MCV 86 11/08/2022   PLT 361 11/08/2022   Lab Results  Component Value Date   IRON 41 11/08/2022   TIBC 351 11/08/2022   FERRITIN 20 11/08/2022   Lab Results  Component Value Date   VITAMINB12 601 11/08/2022   Lab Results  Component Value Date   FOLATE 5.1 11/08/2022   Lab Results  Component Value Date   ALT 14 11/08/2022   AST 16 11/08/2022   ALKPHOS 109 11/08/2022   BILITOT 0.3 11/08/2022   Lab Results  Component Value Date   TSH 2.580 11/08/2022   Lab Results  Component Value Date   CRP 1 11/08/2022   Lab Results  Component Value Date   VITAMINB12 601 11/08/2022  Lab Results  Component Value Date   FOLATE 5.1 11/08/2022     Imaging Studies   No results found.  Assessment       PLAN   ***   Leanna Battles. Melvyn Neth, MHS, PA-C [facility]

## 2022-12-20 ENCOUNTER — Encounter: Payer: Self-pay | Admitting: Gastroenterology

## 2022-12-20 ENCOUNTER — Ambulatory Visit: Payer: BC Managed Care – PPO | Admitting: Gastroenterology

## 2023-01-05 ENCOUNTER — Telehealth: Payer: Self-pay | Admitting: Orthopaedic Surgery

## 2023-01-06 MED ORDER — HYDROCODONE-ACETAMINOPHEN 7.5-325 MG PO TABS: 1.0000 | ORAL_TABLET | Freq: Four times a day (QID) | ORAL | 0 refills | Status: DC | PRN

## 2023-01-15 ENCOUNTER — Encounter: Payer: Self-pay | Admitting: Orthopaedic Surgery

## 2023-01-15 ENCOUNTER — Ambulatory Visit: Payer: BC Managed Care – PPO | Admitting: Orthopaedic Surgery

## 2023-01-15 VITALS — BP 131/65 | HR 62

## 2023-01-15 DIAGNOSIS — M545 Low back pain, unspecified: Secondary | ICD-10-CM | POA: Diagnosis not present

## 2023-01-15 DIAGNOSIS — G8929 Other chronic pain: Secondary | ICD-10-CM | POA: Diagnosis not present

## 2023-01-15 NOTE — Progress Notes (Signed)
My back is the same.  She has good and bad days with her lower back.  Today it is rainy and she has more pain. She has no new trauma, no weakness.  She is trying to do her exercises.  Spine/Pelvis examination:  Inspection:  Overall, sacoiliac joint benign and hips nontender; without crepitus or defects.   Thoracic spine inspection: Alignment normal without kyphosis present   Lumbar spine inspection:  Alignment  with normal lumbar lordosis, without scoliosis apparent.   Thoracic spine palpation:  without tenderness of spinal processes   Lumbar spine palpation: without tenderness of lumbar area; without tightness of lumbar muscles    Range of Motion:   Lumbar flexion, forward flexion is normal without pain or tenderness    Lumbar extension is full without pain or tenderness   Left lateral bend is normal without pain or tenderness   Right lateral bend is normal without pain or tenderness   Straight leg raising is normal  Strength & tone: normal   Stability overall normal stability  Encounter Diagnosis  Name Primary?   Chronic midline low back pain without sciatica Yes   Continue her medicine and exercises.  Return in three months.  Call if any problem.  Precautions discussed.  Electronically Signed Darreld Mclean, MD 5/15/20249:11 AM

## 2023-01-16 ENCOUNTER — Ambulatory Visit: Payer: BC Managed Care – PPO | Admitting: Orthopaedic Surgery

## 2023-02-02 ENCOUNTER — Telehealth: Payer: Self-pay | Admitting: Orthopaedic Surgery

## 2023-02-04 MED ORDER — HYDROCODONE-ACETAMINOPHEN 7.5-325 MG PO TABS: 1.0000 | ORAL_TABLET | Freq: Four times a day (QID) | ORAL | 0 refills | Status: DC | PRN

## 2023-02-05 ENCOUNTER — Other Ambulatory Visit: Payer: Self-pay | Admitting: Student

## 2023-03-02 ENCOUNTER — Telehealth: Payer: Self-pay | Admitting: Orthopaedic Surgery

## 2023-03-03 MED ORDER — HYDROCODONE-ACETAMINOPHEN 7.5-325 MG PO TABS: 1.0000 | ORAL_TABLET | Freq: Four times a day (QID) | ORAL | 0 refills | Status: DC | PRN

## 2023-03-04 ENCOUNTER — Other Ambulatory Visit: Payer: Self-pay | Admitting: Student

## 2023-03-14 ENCOUNTER — Encounter: Payer: Self-pay | Admitting: Family Medicine

## 2023-03-14 ENCOUNTER — Ambulatory Visit: Payer: BC Managed Care – PPO | Admitting: Family Medicine

## 2023-03-14 VITALS — BP 90/57 | HR 58 | Temp 98.3°F | Ht 67.0 in | Wt 171.0 lb

## 2023-03-14 DIAGNOSIS — M25461 Effusion, right knee: Secondary | ICD-10-CM | POA: Diagnosis not present

## 2023-03-14 DIAGNOSIS — R5383 Other fatigue: Secondary | ICD-10-CM

## 2023-03-14 DIAGNOSIS — D649 Anemia, unspecified: Secondary | ICD-10-CM

## 2023-03-14 DIAGNOSIS — M25462 Effusion, left knee: Secondary | ICD-10-CM

## 2023-03-14 DIAGNOSIS — R531 Weakness: Secondary | ICD-10-CM

## 2023-03-14 LAB — CMP14+EGFR
Albumin: 3.8 g/dL (ref 3.8–4.9)
BUN/Creatinine Ratio: 16 (ref 9–23)
BUN: 18 mg/dL (ref 6–24)
Bilirubin Total: 0.3 mg/dL (ref 0.0–1.2)
CO2: 20 mmol/L (ref 20–29)
Chloride: 109 mmol/L — ABNORMAL HIGH (ref 96–106)
Creatinine, Ser: 1.15 mg/dL — ABNORMAL HIGH (ref 0.57–1.00)
Potassium: 4.6 mmol/L (ref 3.5–5.2)
eGFR: 57 mL/min/{1.73_m2} — ABNORMAL LOW (ref >59)

## 2023-03-14 LAB — VITAMIN D 25 HYDROXY (VIT D DEFICIENCY, FRACTURES)

## 2023-03-14 LAB — THYROID PANEL WITH TSH

## 2023-03-14 NOTE — Patient Instructions (Signed)
[address] [place], [place] [address] [phone]

## 2023-03-14 NOTE — Progress Notes (Signed)
Acute Office Visit  Subjective:  Patient ID: [name], female    DOB: 03-03-1971, 52 y.o.   MRN: [id number]  Chief Complaint  Patient presents with   Medical Management of Chronic Issues    Feeling low energy, fatigue Swelling bilateral legs labwork   HPI Patient is in today for fatigue and bilateral lower extremity edema.   States that rash reappeared on her arms at the beginning of the week, but is significantly improving. She put benadryl cream on the rash, through states it was not itching.  States that she is feeling weak and she is concerned that she is anemic again.  Denies presyncope, dizziness. Denies N/V. States that she only drinks water with drink packets. She has cut out soda. She is drinking 3-5 per day. States that she feels dehydrated though because her lips are dry. Has history of anemia and is taking a supplement every other day.   Swelling in her lower legs. Reports that it is difficult for her to walk due to the pain. Reports that she stands all day at work, but she has worked there for a year. Has tried [employer], which helped with the soreness some.   ROS As per HPI  Objective:  BP (!) 90/57   Pulse (!) 58   Temp 98.3 F (36.8 C)   Ht 5\' 7"  (1.702 m)   Wt 171 lb (77.6 kg)   LMP 09/16/2014 Comment: NEG U PREG 1/312/16  SpO2 99%   BMI 26.78 kg/m   Physical Exam Constitutional:      General: She is awake. She is not in acute distress.    Appearance: Normal appearance. She is well-developed, well-groomed and overweight. She is ill-appearing. She is not toxic-appearing or diaphoretic.  Cardiovascular:     Rate and Rhythm: Regular rhythm. Bradycardia present. No extrasystoles are present.    Heart sounds: Normal heart sounds.  Pulmonary:     Effort: Pulmonary effort is normal.     Breath sounds: Normal breath sounds. No stridor, decreased air movement or transmitted upper airway sounds. No decreased breath sounds, wheezing, rhonchi or rales.   Musculoskeletal:     Right knee: Swelling, effusion and crepitus present. No deformity, erythema, ecchymosis, lacerations or bony tenderness. Normal range of motion. No tenderness.     Left knee: Swelling, effusion and crepitus present. No deformity, erythema, ecchymosis, lacerations or bony tenderness. Normal range of motion. No tenderness.  Skin:    General: Skin is warm.     Capillary Refill: Capillary refill takes less than 2 seconds.     Coloration: Skin is pale.     Findings: Erythema and rash present. Rash is macular.     Comments: Small, round, flat, erythematous lesions along left forearm   Neurological:     General: No focal deficit present.     Mental Status: She is alert, oriented to person, place, and time and easily aroused. Mental status is at baseline.     GCS: GCS eye subscore is 4. GCS verbal subscore is 5. GCS motor subscore is 6.     Cranial Nerves: Cranial nerves 2-12 are intact.  Psychiatric:        Attention and Perception: Attention and perception normal.        Mood and Affect: Mood and affect normal.        Speech: Speech normal.        Behavior: Behavior normal. Behavior is cooperative.        Thought  Content: Thought content normal.        Cognition and Memory: Cognition and memory normal.        Judgment: Judgment normal.       03/14/2023    9:27 AM 12/13/2022    9:09 AM 11/08/2022    9:44 AM  Depression screen PHQ 2/9  Decreased Interest 0 0 0  Down, Depressed, Hopeless 0 0 0  PHQ - 2 Score 0 0 0  Altered sleeping 1 3 3   Tired, decreased energy 1 3 3   Change in appetite 0 0 1  Feeling bad or failure about yourself  0 0 0  Trouble concentrating 0 0 0  Moving slowly or fidgety/restless 0 0 0  Suicidal thoughts 0 0 0  PHQ-9 Score 2 6 7   Difficult doing work/chores Not difficult at all Not difficult at all Somewhat difficult      03/14/2023    9:27 AM 12/13/2022    9:10 AM 11/08/2022    9:44 AM  GAD 7 : Generalized Anxiety Score  Nervous, Anxious,  on Edge 0 0 0  Control/stop worrying 0 0 0  Worry too much - different things 0 0 0  Trouble relaxing 0 0 0  Restless 0 0 0  Easily annoyed or irritable 0 0 0  Afraid - awful might happen 0 0 0  Total GAD 7 Score 0 0 0  Anxiety Difficulty Not difficult at all Not difficult at all Not difficult at all   Assessment & Plan:  1. Other fatigue Labs as below. Will communicate results to patient once available.  Will coordinate with Wolfgang Phoenix, MD for follow up.   2. Weakness Labs as below. Will communicate results to patient once available.  - CMP14+EGFR - VITAMIN D 25 Hydroxy (Vit-D Deficiency, Fractures) - Thyroid Panel With TSH  3. Anemia, unspecified type Labs as below. Will communicate results to patient once available.  - Anemia Profile B; Future - Fecal occult blood, imunochemical; Future - Anemia Profile B  4. Bilateral knee swelling Discussed utilizing soft knee brace to assist with pain and swelling. Depending on labs, patient can start NSAIDs. Discussed with patient RICE treatment after working for long periods of time. Discussed with patient following up with orthopedics as she has a history of bilateral knee replacements. She has follow up in one month with ortho.    The above assessment and management plan was discussed with the patient. The patient verbalized understanding of and has agreed to the management plan using shared-decision making. Patient is aware to call the clinic if they develop any new symptoms or if symptoms fail to improve or worsen. Patient is aware when to return to the clinic for a follow-up visit. Patient educated on when it is appropriate to go to the emergency department.   Return in about 2 months (around 05/15/2023) for Chronic Condition Follow up.  Neale Burly, DNP-FNP [facility] [address] [place], [place] [address] [phone]

## 2023-03-15 LAB — THYROID PANEL WITH TSH
T3 Uptake Ratio: 32 % (ref 24–39)
T4, Total: 5.6 ug/dL (ref 4.5–12.0)

## 2023-03-15 LAB — CMP14+EGFR
ALT: 39 IU/L — ABNORMAL HIGH (ref 0–32)
AST: 14 IU/L (ref 0–40)
Alkaline Phosphatase: 161 IU/L — ABNORMAL HIGH (ref 44–121)
Calcium: 9.7 mg/dL (ref 8.7–10.2)
Globulin, Total: 2.8 g/dL (ref 1.5–4.5)
Glucose: 80 mg/dL (ref 70–99)
Sodium: 142 mmol/L (ref 134–144)
Total Protein: 6.6 g/dL (ref 6.0–8.5)

## 2023-03-17 ENCOUNTER — Other Ambulatory Visit: Payer: Self-pay | Admitting: Family

## 2023-03-17 LAB — ANEMIA PROFILE B
Basophils Absolute: 0 10*3/uL (ref 0.0–0.2)
Basos: 1 %
EOS (ABSOLUTE): 0.1 10*3/uL (ref 0.0–0.4)
Eos: 2 %
Ferritin: 23 ng/mL (ref 15–150)
Folate: 2.9 ng/mL — ABNORMAL LOW (ref >3.0)
Hematocrit: 27.8 % — ABNORMAL LOW (ref 34.0–46.6)
Hemoglobin: 8.4 g/dL — CL (ref 11.1–15.9)
Immature Grans (Abs): 0 10*3/uL (ref 0.0–0.1)
Immature Granulocytes: 0 %
Iron Saturation: 6 % — CL (ref 15–55)
Iron: 16 ug/dL — ABNORMAL LOW (ref 27–159)
Lymphocytes Absolute: 1.9 10*3/uL (ref 0.7–3.1)
Lymphs: 27 %
MCH: 26 pg — ABNORMAL LOW (ref 26.6–33.0)
MCHC: 30.2 g/dL — ABNORMAL LOW (ref 31.5–35.7)
MCV: 86 fL (ref 79–97)
Monocytes Absolute: 0.5 10*3/uL (ref 0.1–0.9)
Monocytes: 7 %
Neutrophils Absolute: 4.4 10*3/uL (ref 1.4–7.0)
Neutrophils: 63 %
Platelets: 345 10*3/uL (ref 150–450)
RBC: 3.23 x10E6/uL — ABNORMAL LOW (ref 3.77–5.28)
RDW: 16.1 % — ABNORMAL HIGH (ref 11.7–15.4)
Retic Ct Pct: 1.5 % (ref 0.6–2.6)
Total Iron Binding Capacity: 290 ug/dL (ref 250–450)
UIBC: 274 ug/dL (ref 131–425)
Vitamin B-12: 1011 pg/mL (ref 232–1245)
WBC: 6.9 10*3/uL (ref 3.4–10.8)

## 2023-03-17 NOTE — Progress Notes (Signed)
Critical iron and hgb, but stable. Will let PCP address when back.   Jannifer Rodney, FNP

## 2023-03-18 NOTE — Progress Notes (Signed)
Patient is anemic. Would like her to complete FOBT to ensure that she is not having an acute bleed. She has iron on her med list, can we confirm if she is taking this? Recommend she start over the counter folic acid supplementation daily. Would like to refer patient to hematology for possible iron infusion. Patient also has decrease in renal function. Has referral placed to [facility]. Recommend avoiding NSAIDs, increasing hydration to 80-100 oz of water daily. Will repeat CMP next week.

## 2023-03-18 NOTE — Addendum Note (Signed)
Addended by: Neale Burly on: 03/18/2023 08:17 AM   Modules accepted: Orders

## 2023-03-20 NOTE — Progress Notes (Signed)
Referral placed for hematology. 

## 2023-03-20 NOTE — Addendum Note (Signed)
Addended by: Neale Burly on: 03/20/2023 08:36 AM   Modules accepted: Orders

## 2023-03-21 ENCOUNTER — Other Ambulatory Visit: Payer: BC Managed Care – PPO

## 2023-03-21 DIAGNOSIS — D649 Anemia, unspecified: Secondary | ICD-10-CM

## 2023-03-21 LAB — ANEMIA PROFILE B
Basos: 1 %
EOS (ABSOLUTE): 0.1 10*3/uL (ref 0.0–0.4)
Eos: 2 %
Hematocrit: 29.5 % — ABNORMAL LOW (ref 34.0–46.6)
Hemoglobin: 8.8 g/dL — CL (ref 11.1–15.9)
Immature Granulocytes: 0 %
Lymphs: 33 %
Monocytes Absolute: 0.4 10*3/uL (ref 0.1–0.9)

## 2023-03-21 LAB — CMP14+EGFR
Albumin: 3.7 g/dL — ABNORMAL LOW (ref 3.8–4.9)
Alkaline Phosphatase: 154 IU/L — ABNORMAL HIGH (ref 44–121)
BUN/Creatinine Ratio: 20 (ref 9–23)
Bilirubin Total: 0.3 mg/dL (ref 0.0–1.2)
Creatinine, Ser: 0.95 mg/dL (ref 0.57–1.00)
Globulin, Total: 2.6 g/dL (ref 1.5–4.5)
Glucose: 83 mg/dL (ref 70–99)
eGFR: 72 mL/min/{1.73_m2} (ref >59)

## 2023-03-22 LAB — ANEMIA PROFILE B
Immature Grans (Abs): 0 10*3/uL (ref 0.0–0.1)
Iron Saturation: 10 % — ABNORMAL LOW (ref 15–55)
Lymphocytes Absolute: 1.8 10*3/uL (ref 0.7–3.1)
MCHC: 29.8 g/dL — ABNORMAL LOW (ref 31.5–35.7)
MCV: 87 fL (ref 79–97)
Neutrophils: 57 %
RDW: 16.2 % — ABNORMAL HIGH (ref 11.7–15.4)
WBC: 5.5 10*3/uL (ref 3.4–10.8)

## 2023-03-22 LAB — CMP14+EGFR
ALT: 20 IU/L (ref 0–32)
Calcium: 9.7 mg/dL (ref 8.7–10.2)
Chloride: 110 mmol/L — ABNORMAL HIGH (ref 96–106)

## 2023-03-22 LAB — FECAL OCCULT BLOOD, IMMUNOCHEMICAL: Fecal Occult Bld: NEGATIVE

## 2023-03-24 LAB — CMP14+EGFR
AST: 18 IU/L (ref 0–40)
BUN: 19 mg/dL (ref 6–24)
CO2: 20 mmol/L (ref 20–29)
Potassium: 5 mmol/L (ref 3.5–5.2)
Sodium: 142 mmol/L (ref 134–144)
Total Protein: 6.3 g/dL (ref 6.0–8.5)

## 2023-03-24 LAB — ANEMIA PROFILE B
Basophils Absolute: 0.1 10*3/uL (ref 0.0–0.2)
Ferritin: 16 ng/mL (ref 15–150)
Folate: 7.3 ng/mL (ref >3.0)
Iron: 30 ug/dL (ref 27–159)
MCH: 26 pg — ABNORMAL LOW (ref 26.6–33.0)
Monocytes: 7 %
Neutrophils Absolute: 3.1 10*3/uL (ref 1.4–7.0)
Platelets: 357 10*3/uL (ref 150–450)
RBC: 3.38 x10E6/uL — ABNORMAL LOW (ref 3.77–5.28)
Retic Ct Pct: 1.6 % (ref 0.6–2.6)
UIBC: 263 ug/dL (ref 131–425)
Vitamin B-12: 1228 pg/mL (ref 232–1245)

## 2023-04-04 ENCOUNTER — Telehealth: Payer: Self-pay | Admitting: Orthopaedic Surgery

## 2023-04-07 ENCOUNTER — Other Ambulatory Visit: Payer: Self-pay | Admitting: Orthopaedic Surgery

## 2023-04-07 MED ORDER — HYDROCODONE-ACETAMINOPHEN 7.5-325 MG PO TABS: 1.0000 | ORAL_TABLET | Freq: Four times a day (QID) | ORAL | 0 refills | Status: DC | PRN

## 2023-04-08 ENCOUNTER — Other Ambulatory Visit: Payer: Self-pay | Admitting: Student

## 2023-04-10 NOTE — Progress Notes (Signed)
[facility] [address], [place] [address]   Clinic Day:  04/11/2023  Referring physician: Arrie Senate*  Patient Care Team: Ellamae Sia Aleen Campi, FNP as PCP - General (Family Medicine) Doreatha Massed, MD as Medical Oncologist (Hematology)   ASSESSMENT & PLAN:   Assessment:  1.  Iron deficiency anemia: - 03/21/2023: Stool for occult blood negative, ferritin 16,% 10, Hb-8.8, MCV-87 - Last hospitalized in March 2023 with severe anemia, required 2 units PRBC. - EGD (11/29/2021): Normal esophagus.  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  Distal bulb and second portion of duodenum appeared normal. - LMP 4 to 5 years ago.  Denies BRBPR/melena. - Takes iron tablet every other day since March 2023.  Positive for ice pica.  2.  Social/family history: - Works as a [employer] manager.  Non-smoker. - No family history of malignancy.  Plan:  1.  Iron deficiency anemia: - I have reviewed and discussed recent labs from 03/21/2023. - She has not been getting adequate response with oral iron therapy. - Recommend parenteral iron therapy with INFeD 1 g x 1. - Discussed side effects including rare chance of anaphylactic reaction. - RTC 8 weeks for follow-up with repeat CBC, ferritin and iron panel.   Orders Placed This Encounter  Procedures   CBC with Differential    Standing Status:   Future    Standing Expiration Date:   04/10/2024   Reticulocytes    Standing Status:   Future    Standing Expiration Date:   04/10/2024   Ferritin    Standing Status:   Future    Standing Expiration Date:   04/10/2024   Iron and TIBC ([facility] ONLY)    Standing Status:   Future    Standing Expiration Date:   04/10/2024      Mikeal Hawthorne R Teague,acting as a scribe for Doreatha Massed, MD.,have documented all relevant documentation on the behalf of Doreatha Massed, MD,as directed by  Doreatha Massed, MD while in the presence of Doreatha Massed, MD.   I, Doreatha Massed MD, have reviewed the above documentation for accuracy and completeness, and I agree with the above.   Doreatha Massed, MD   8/9/202411:57 AM  CHIEF COMPLAINT/PURPOSE OF CONSULT:   Diagnosis: Iron deficiency anemia  Current Therapy: Parenteral iron therapy  HISTORY OF PRESENT ILLNESS:   [name] is a 52 y.o. female presenting to clinic today for evaluation of anemia at the request of Milian, Aleen Campi, FNP.  Today, she states that she is doing well overall. Her appetite level is at 60%. Her energy level is at 10%.  She was found to have abnormal Anemia Profile B from 03/21/23 with decreased iron saturation at 10, decreased RBC at 3.38, severely decreased hemoglobin at 8.8, decreased HCT at 29.5, decreased MCH at 26.0, decreased MCHC at 29.8, and elevated RDW at 16.2.     PAST MEDICAL HISTORY:   Past Medical History: Past Medical History:  Diagnosis Date   Anemia    Arthritis    Chronic back pain    PAF (paroxysmal atrial fibrillation) (HCC)    a. s/p DCCV in 10/2021    Surgical History: Past Surgical History:  Procedure Laterality Date   BIOPSY  11/29/2021   Procedure: BIOPSY;  Surgeon: Corbin Ade, MD;  Location: [facility];  Service: Endoscopy;;   CHOLECYSTECTOMY     ESOPHAGOGASTRODUODENOSCOPY (EGD) WITH PROPOFOL N/A 11/29/2021  Surgeon: Corbin Ade, MD; extensive gastric ulceration extending into the duodenal bulb with secondary pyloric stenosis and retained gastric contents.  Pyloric channel dilated nicely with scope passage only. Gastric mucosa was biopsied and negative for H. pylori, malignancy, dysplasia.   REPLACEMENT TOTAL KNEE BILATERAL     TUBAL LIGATION      Social History: Social History   Socioeconomic History   Marital status: Legally Separated    Spouse name: Not on file   Number of children: Not on  file   Years of education: Not on file   Highest education level: Not on file  Occupational History   Not on file  Tobacco Use   Smoking status: Former    Current packs/day: 0.00    Average packs/day: 0.5 packs/day for 20.0 years (10.0 ttl pk-yrs)    Types: Cigarettes    Start date: 11/29/2001    Quit date: 11/29/2021    Years since quitting: 1.3   Smokeless tobacco: Never  Vaping Use   Vaping status: Former  Substance and Sexual Activity   Alcohol use: No   Drug use: No   Sexual activity: Yes    Birth control/protection: None, Surgical  Other Topics Concern   Not on file  Social History Narrative   Not on file   Social Determinants of Health   Financial Resource Strain: Not on file  Food Insecurity: Not on file  Transportation Needs: Not on file  Physical Activity: Not on file  Stress: Not on file  Social Connections: Not on file  Intimate Partner Violence: Not on file    Family History: Family History  Problem Relation Age of Onset   Diabetes Mother    Stroke Father    Colon cancer Neg Hx    Inflammatory bowel disease Neg Hx     Current Medications:  Current Outpatient Medications:    albuterol (VENTOLIN HFA) 108 (90 Base) MCG/ACT inhaler, Inhale 2 puffs into the lungs every 6 (six) hours as needed for wheezing or shortness of breath., Disp: , Rfl:    ferrous sulfate 324 MG TBEC, Take 324 mg by mouth., Disp: , Rfl:    HYDROcodone-acetaminophen (NORCO) 7.5-325 MG tablet, Take 1 tablet by mouth every 6 (six) hours as needed for moderate pain (Must last 30 days)., Disp: 36 tablet, Rfl: 0   MELATONIN PO, Take 1 tablet by mouth daily as needed (sleep)., Disp: , Rfl:    Menthol, Topical Analgesic, (BIOFREEZE ROLL-ON EX), Apply topically., Disp: , Rfl:    metoprolol tartrate (LOPRESSOR) 25 MG tablet, TAKE ONE TABLET TWICE DAILY. NEEDS TO BE SEEN FOR REFILLS, Disp: 30 tablet, Rfl: 0   ondansetron (ZOFRAN) 4 MG tablet, Take 1 tablet (4 mg total) by mouth every 8 (eight)  hours as needed for nausea or vomiting., Disp: 30 tablet, Rfl: 1   pantoprazole (PROTONIX) 40 MG tablet, TAKE ONE TABLET TWICE DAILY BEFORE MEALS, Disp: 15 tablet, Rfl: 0   Allergies: Allergies  Allergen Reactions   Penicillins Nausea And Vomiting    REVIEW OF SYSTEMS:   Review of Systems  Constitutional:  Negative for chills, fatigue and fever.  HENT:   Negative for lump/mass, mouth sores, nosebleeds, sore throat and trouble swallowing.   Eyes:  Negative for eye problems.  Respiratory:  Negative for cough and shortness of breath.   Cardiovascular:  Negative for chest pain, leg swelling and palpitations.  Gastrointestinal:  Negative for abdominal pain, constipation, diarrhea, nausea and vomiting.  Genitourinary:  Negative for bladder incontinence,  difficulty urinating, dysuria, frequency, hematuria and nocturia.   Musculoskeletal:  Negative for arthralgias, back pain, flank pain, myalgias and neck pain.  Skin:  Negative for itching and rash.  Neurological:  Positive for headaches and numbness. Negative for dizziness.  Hematological:  Does not bruise/bleed easily.  Psychiatric/Behavioral:  Positive for sleep disturbance. Negative for depression and suicidal ideas. The patient is not nervous/anxious.   All other systems reviewed and are negative.    VITALS:   Blood pressure 107/71, pulse 65, temperature 99 F (37.2 C), temperature source Oral, resp. rate 18, height 5\' 7"  (1.702 m), weight 168 lb (76.2 kg), last menstrual period 09/16/2014, SpO2 100%.  Wt Readings from Last 3 Encounters:  04/11/23 168 lb (76.2 kg)  03/14/23 171 lb (77.6 kg)  12/13/22 169 lb (76.7 kg)    Body mass index is 26.31 kg/m.   PHYSICAL EXAM:   Physical Exam Vitals and nursing note reviewed. Exam conducted with a chaperone present.  Constitutional:      Appearance: Normal appearance.  Cardiovascular:     Rate and Rhythm: Normal rate and regular rhythm.     Pulses: Normal pulses.     Heart sounds:  Normal heart sounds.  Pulmonary:     Effort: Pulmonary effort is normal.     Breath sounds: Normal breath sounds.  Abdominal:     Palpations: Abdomen is soft. There is no hepatomegaly, splenomegaly or mass.     Tenderness: There is no abdominal tenderness.  Musculoskeletal:     Right lower leg: No edema.     Left lower leg: No edema.  Lymphadenopathy:     Cervical: No cervical adenopathy.     Right cervical: No superficial, deep or posterior cervical adenopathy.    Left cervical: No superficial, deep or posterior cervical adenopathy.     Upper Body:     Right upper body: No supraclavicular or axillary adenopathy.     Left upper body: No supraclavicular or axillary adenopathy.  Neurological:     General: No focal deficit present.     Mental Status: She is alert and oriented to person, place, and time.  Psychiatric:        Mood and Affect: Mood normal.        Behavior: Behavior normal.     LABS:      Latest Ref Rng & Units 03/21/2023    8:46 AM 03/14/2023   10:07 AM 11/08/2022   10:17 AM  CBC  WBC 3.4 - 10.8 x10E3/uL 5.5  6.9  7.4   Hemoglobin 11.1 - 15.9 g/dL 8.8  8.4  9.5   Hematocrit 34.0 - 46.6 % 29.5  27.8  31.8   Platelets 150 - 450 x10E3/uL 357  345  361       Latest Ref Rng & Units 03/21/2023    8:46 AM 03/14/2023   10:12 AM 11/08/2022   10:17 AM  CMP  Glucose 70 - 99 mg/dL 83  80  77   BUN 6 - 24 mg/dL 19  18  19    Creatinine 0.57 - 1.00 mg/dL 4.09  8.11  9.14   Sodium 134 - 144 mmol/L 142  142  140   Potassium 3.5 - 5.2 mmol/L 5.0  4.6  4.9   Chloride 96 - 106 mmol/L 110  109  106   CO2 20 - 29 mmol/L 20  20  21    Calcium 8.7 - 10.2 mg/dL 9.7  9.7  9.7   Total Protein 6.0 -  8.5 g/dL 6.3  6.6  7.3   Total Bilirubin 0.0 - 1.2 mg/dL 0.3  0.3  0.3   Alkaline Phos 44 - 121 IU/L 154  161  109   AST 0 - 40 IU/L 18  14  16    ALT 0 - 32 IU/L 20  39  14      No results found for: "CEA1", "CEA" / No results found for: "CEA1", "CEA" No results found for: "PSA1" No  results found for: "GEX528" No results found for: "CAN125"  No results found for: "TOTALPROTELP", "ALBUMINELP", "A1GS", "A2GS", "BETS", "BETA2SER", "GAMS", "MSPIKE", "SPEI" Lab Results  Component Value Date   TIBC 293 03/21/2023   TIBC 290 03/14/2023   TIBC 351 11/08/2022   FERRITIN 16 03/21/2023   FERRITIN 23 03/14/2023   FERRITIN 20 11/08/2022   IRONPCTSAT 10 (L) 03/21/2023   IRONPCTSAT 6 (LL) 03/14/2023   IRONPCTSAT 12 (L) 11/08/2022   No results found for: "LDH"   STUDIES:   No results found.

## 2023-04-11 ENCOUNTER — Inpatient Hospital Stay: Payer: BC Managed Care – PPO | Attending: Hematology | Admitting: Hematology

## 2023-04-11 VITALS — BP 107/71 | HR 65 | Temp 99.0°F | Resp 18 | Ht 67.0 in | Wt 168.0 lb

## 2023-04-11 DIAGNOSIS — Z87891 Personal history of nicotine dependence: Secondary | ICD-10-CM | POA: Diagnosis not present

## 2023-04-11 DIAGNOSIS — Z79899 Other long term (current) drug therapy: Secondary | ICD-10-CM | POA: Insufficient documentation

## 2023-04-11 DIAGNOSIS — D508 Other iron deficiency anemias: Secondary | ICD-10-CM

## 2023-04-11 DIAGNOSIS — D509 Iron deficiency anemia, unspecified: Secondary | ICD-10-CM | POA: Diagnosis not present

## 2023-04-11 NOTE — Patient Instructions (Addendum)
[facility]  Discharge Instructions  You were seen and examined today by Dr. Ellin Saba. Dr. Ellin Saba is a hematologist, meaning that he specializes in blood abnormalities. Dr. Ellin Saba discussed your past medical history, family history of cancers/blood conditions and the events that led to you being here today.  You were referred to Dr. Ellin Saba due to anemia.  Dr. Ellin Saba has recommended an iron infusion. We will arrange for that to be done in the Cancer Center.  Follow-up as scheduled.  Thank you for choosing [facility] to provide your oncology and hematology care.   To afford each patient quality time with our provider, please arrive at least 15 minutes before your scheduled appointment time. You may need to reschedule your appointment if you arrive late (10 or more minutes). Arriving late affects you and other patients whose appointments are after yours.  Also, if you miss three or more appointments without notifying the office, you may be dismissed from the clinic at the provider's discretion.    Again, thank you for choosing [facility].  Our hope is that these requests will decrease the amount of time that you wait before being seen by our physicians.   If you have a lab appointment with the Cancer Center - please note that after April 8th, all labs will be drawn in the cancer center.  You do not have to check in or register with the main entrance as you have in the past but will complete your check-in at the cancer center.            _____________________________________________________________  Should you have questions after your visit to [facility], please contact our office at [phone] and follow the prompts.  Our office hours are 8:00 a.m. to 4:30 p.m. Monday - Thursday and 8:00 a.m. to 2:30 p.m. Friday.  Please note that voicemails left after 4:00 p.m. may not be returned until the  following business day.  We are closed weekends and all major holidays.  You do have access to a nurse 24-7, just call the main number to the clinic [phone] and do not press any options, hold on the line and a nurse will answer the phone.    For prescription refill requests, have your pharmacy contact our office and allow 72 hours.    Masks are no longer required in the cancer centers. If you would like for your care team to wear a mask while they are taking care of you, please let them know. You may have one support person who is at least 53 years old accompany you for your appointments.

## 2023-04-16 ENCOUNTER — Encounter: Payer: Self-pay | Admitting: Orthopaedic Surgery

## 2023-04-16 ENCOUNTER — Ambulatory Visit: Payer: BC Managed Care – PPO | Admitting: Orthopaedic Surgery

## 2023-04-16 VITALS — BP 116/79 | HR 66 | Ht 68.0 in | Wt 172.0 lb

## 2023-04-16 DIAGNOSIS — G8929 Other chronic pain: Secondary | ICD-10-CM | POA: Diagnosis not present

## 2023-04-16 DIAGNOSIS — M545 Low back pain, unspecified: Secondary | ICD-10-CM

## 2023-04-16 NOTE — Progress Notes (Signed)
My back hurts.  She has chronic pain of the lower back.  She has no new trauma, no weakness.  ROM of the back is forward 30, lateral bend 5 each side, muscle tone and strength is normal, gait is good, DTRs intact, SLR negative, gait normal.  Encounter Diagnosis  Name Primary?   Chronic midline low back pain without sciatica Yes   Continue exercises at home.  Continue present medicines.  Return in three months.  Call if any problem.  Precautions discussed.  Electronically Signed Darreld Mclean, MD 8/14/202410:15 AM

## 2023-04-17 ENCOUNTER — Ambulatory Visit: Payer: BC Managed Care – PPO

## 2023-04-17 ENCOUNTER — Inpatient Hospital Stay: Payer: BC Managed Care – PPO

## 2023-04-17 VITALS — BP 116/75 | HR 72 | Temp 97.8°F | Resp 18

## 2023-04-17 DIAGNOSIS — Z79899 Other long term (current) drug therapy: Secondary | ICD-10-CM | POA: Diagnosis not present

## 2023-04-17 DIAGNOSIS — D508 Other iron deficiency anemias: Secondary | ICD-10-CM

## 2023-04-17 DIAGNOSIS — D509 Iron deficiency anemia, unspecified: Secondary | ICD-10-CM | POA: Diagnosis not present

## 2023-04-17 DIAGNOSIS — Z87891 Personal history of nicotine dependence: Secondary | ICD-10-CM | POA: Diagnosis not present

## 2023-04-17 MED ORDER — METHYLPREDNISOLONE SODIUM SUCC 125 MG IJ SOLR
125.0000 mg | Freq: Once | INTRAMUSCULAR | Status: AC
  Administered 2023-04-17: 125 mg via INTRAVENOUS
  Filled 2023-04-17: qty 2

## 2023-04-17 MED ORDER — SODIUM CHLORIDE 0.9 % IV SOLN
950.0000 mg | Freq: Once | INTRAVENOUS | Status: AC
  Administered 2023-04-17: 950 mg via INTRAVENOUS
  Filled 2023-04-17: qty 19

## 2023-04-17 MED ORDER — FAMOTIDINE IN NACL 20-0.9 MG/50ML-% IV SOLN
20.0000 mg | Freq: Once | INTRAVENOUS | Status: AC
  Administered 2023-04-17: 20 mg via INTRAVENOUS
  Filled 2023-04-17: qty 50

## 2023-04-17 MED ORDER — SODIUM CHLORIDE 0.9 % IV SOLN
50.0000 mg | Freq: Once | INTRAVENOUS | Status: AC
  Administered 2023-04-17: 50 mg via INTRAVENOUS
  Filled 2023-04-17: qty 1

## 2023-04-17 MED ORDER — DIPHENHYDRAMINE HCL 25 MG PO CAPS: 50.0000 mg | ORAL_CAPSULE | Freq: Once | ORAL | Status: DC

## 2023-04-17 MED ORDER — CETIRIZINE HCL 10 MG/ML IV SOLN
10.0000 mg | Freq: Once | INTRAVENOUS | Status: AC
  Administered 2023-04-17: 10 mg via INTRAVENOUS
  Filled 2023-04-17: qty 1

## 2023-04-17 MED ORDER — SODIUM CHLORIDE 0.9 % IV SOLN: Freq: Once | INTRAVENOUS | Status: AC

## 2023-04-17 MED ORDER — ACETAMINOPHEN 325 MG PO TABS
650.0000 mg | ORAL_TABLET | Freq: Once | ORAL | Status: AC
  Administered 2023-04-17: 650 mg via ORAL
  Filled 2023-04-17: qty 2

## 2023-04-17 NOTE — Progress Notes (Signed)
Infed information given to the patient for review with all questions asked and answered.    Patient tolerated iron infusion with no complaints voiced.  Peripheral IV site clean and dry with good blood return noted before and after infusion.  Band aid applied.  VSS with discharge and left in satisfactory condition with no s/s of distress noted.

## 2023-04-17 NOTE — Patient Instructions (Signed)
 Ferumoxytol Injection What is this medication? FERUMOXYTOL (FER ue MOX i tol) treats low levels of iron in your body (iron deficiency anemia). Iron is a mineral that plays an important role in making red blood cells, which carry oxygen from your lungs to the rest of your body. This medicine may be used for other purposes; ask your health care provider or pharmacist if you have questions. COMMON BRAND NAME(S): Feraheme What should I tell my care team before I take this medication? They need to know if you have any of these conditions: Anemia not caused by low iron levels High levels of iron in the blood Magnetic resonance imaging (MRI) test scheduled An unusual or allergic reaction to iron, other medications, foods, dyes, or preservatives Pregnant or trying to get pregnant Breastfeeding How should I use this medication? This medication is injected into a vein. It is given by your care team in a hospital or clinic setting. Talk to your care team the use of this medication in children. Special care may be needed. Overdosage: If you think you have taken too much of this medicine contact a poison control center or emergency room at once. NOTE: This medicine is only for you. Do not share this medicine with others. What if I miss a dose? It is important not to miss your dose. Call your care team if you are unable to keep an appointment. What may interact with this medication? Other iron products This list may not describe all possible interactions. Give your health care provider a list of all the medicines, herbs, non-prescription drugs, or dietary supplements you use. Also tell them if you smoke, drink alcohol, or use illegal drugs. Some items may interact with your medicine. What should I watch for while using this medication? Visit your care team regularly. Tell your care team if your symptoms do not start to get better or if they get worse. You may need blood work done while you are taking this  medication. You may need to follow a special diet. Talk to your care team. Foods that contain iron include: whole grains/cereals, dried fruits, beans, or peas, leafy green vegetables, and organ meats (liver, kidney). What side effects may I notice from receiving this medication? Side effects that you should report to your care team as soon as possible: Allergic reactions--skin rash, itching, hives, swelling of the face, lips, tongue, or throat Low blood pressure--dizziness, feeling faint or lightheaded, blurry vision Shortness of breath Side effects that usually do not require medical attention (report to your care team if they continue or are bothersome): Flushing Headache Joint pain Muscle pain Nausea Pain, redness, or irritation at injection site This list may not describe all possible side effects. Call your doctor for medical advice about side effects. You may report side effects to FDA at 1-800-FDA-1088. Where should I keep my medication? This medication is given in a hospital or clinic. It will not be stored at home. NOTE: This sheet is a summary. It may not cover all possible information. If you have questions about this medicine, talk to your doctor, pharmacist, or health care provider.  2024 Elsevier/Gold Standard (2023-01-24 00:00:00)

## 2023-04-28 ENCOUNTER — Other Ambulatory Visit: Payer: Self-pay | Admitting: Cardiology

## 2023-05-05 ENCOUNTER — Telehealth: Payer: Self-pay | Admitting: Orthopaedic Surgery

## 2023-05-06 MED ORDER — HYDROCODONE-ACETAMINOPHEN 7.5-325 MG PO TABS: 1.0000 | ORAL_TABLET | Freq: Four times a day (QID) | ORAL | 0 refills | Status: DC | PRN

## 2023-05-09 NOTE — Telephone Encounter (Signed)
Patient left message stating her pharmacy is out of Hydrocodone and is asking if you will resend her prescription to [facility] in [place]  Hydrocodone-Acetaminophen 7.5/325 MG Qty 36 Tablets  PATIENT IS USING [facility] IN [facility]

## 2023-05-12 MED ORDER — HYDROCODONE-ACETAMINOPHEN 7.5-325 MG PO TABS: 1.0000 | ORAL_TABLET | Freq: Four times a day (QID) | ORAL | 0 refills | Status: DC | PRN

## 2023-05-12 NOTE — Telephone Encounter (Signed)
Patient called this morning stating that [facility] is out of her medication.  Hydrocodone-Acetaminophen 7.5/325 MG Qty  36 Tablets  PATIENT IS USING [facility] [address]

## 2023-05-13 NOTE — Telephone Encounter (Signed)
Patient called and states she has called everywhere and no one has the script that she needs and they are telling her it is on back order everywhere she called.  She wants to know if you can call her in something else this month, and send it to her regular pharmacy [facility] in [place]   Call her back at [phone]

## 2023-05-16 ENCOUNTER — Ambulatory Visit: Payer: BC Managed Care – PPO | Admitting: Family Medicine

## 2023-05-23 NOTE — Addendum Note (Signed)
Addended by: Therese Sarah on: 05/23/2023 09:22 AM   Modules accepted: Orders

## 2023-05-30 ENCOUNTER — Other Ambulatory Visit: Payer: BC Managed Care – PPO

## 2023-05-30 ENCOUNTER — Inpatient Hospital Stay: Payer: BC Managed Care – PPO

## 2023-05-30 DIAGNOSIS — D649 Anemia, unspecified: Secondary | ICD-10-CM

## 2023-05-30 DIAGNOSIS — D508 Other iron deficiency anemias: Secondary | ICD-10-CM

## 2023-05-31 LAB — CBC WITH DIFFERENTIAL/PLATELET
Basophils Absolute: 0 10*3/uL (ref 0.0–0.2)
Basos: 1 %
EOS (ABSOLUTE): 0.3 10*3/uL (ref 0.0–0.4)
Eos: 4 %
Hematocrit: 35.3 % (ref 34.0–46.6)
Hemoglobin: 11 g/dL — ABNORMAL LOW (ref 11.1–15.9)
Immature Grans (Abs): 0 10*3/uL (ref 0.0–0.1)
Immature Granulocytes: 0 %
Lymphocytes Absolute: 2.2 10*3/uL (ref 0.7–3.1)
Lymphs: 29 %
MCH: 29.2 pg (ref 26.6–33.0)
MCHC: 31.2 g/dL — ABNORMAL LOW (ref 31.5–35.7)
MCV: 94 fL (ref 79–97)
Monocytes Absolute: 0.5 10*3/uL (ref 0.1–0.9)
Monocytes: 6 %
Neutrophils Absolute: 4.6 10*3/uL (ref 1.4–7.0)
Neutrophils: 60 %
Platelets: 255 10*3/uL (ref 150–450)
RBC: 3.77 x10E6/uL (ref 3.77–5.28)
RDW: 18.8 % — ABNORMAL HIGH (ref 11.7–15.4)
WBC: 7.5 10*3/uL (ref 3.4–10.8)

## 2023-05-31 LAB — IRON AND TIBC
Iron Saturation: 35 % (ref 15–55)
Iron: 65 ug/dL (ref 27–159)
Total Iron Binding Capacity: 187 ug/dL — ABNORMAL LOW (ref 250–450)
UIBC: 122 ug/dL — ABNORMAL LOW (ref 131–425)

## 2023-05-31 LAB — RETICULOCYTES: Retic Ct Pct: 1.3 % (ref 0.6–2.6)

## 2023-05-31 LAB — FERRITIN: Ferritin: 170 ng/mL — ABNORMAL HIGH (ref 15–150)

## 2023-06-06 ENCOUNTER — Inpatient Hospital Stay: Payer: BC Managed Care – PPO | Attending: Hematology | Admitting: Oncology

## 2023-06-06 ENCOUNTER — Ambulatory Visit: Payer: BC Managed Care – PPO | Admitting: Oncology

## 2023-06-06 NOTE — Progress Notes (Deleted)
[facility] [address] [place] [address]   Clinic Day:  04/11/2023  Referring physician: Arrie Senate*  Patient Care Team: Ellamae Sia Aleen Campi, FNP as PCP - General (Family Medicine) Doreatha Massed, MD as Medical Oncologist (Hematology)   ASSESSMENT & PLAN:   Assessment:  1.  Iron deficiency anemia: - 03/21/2023: Stool for occult blood negative, ferritin 16,% 10, Hb-8.8, MCV-87 - Last hospitalized in March 2023 with severe anemia, required 2 units PRBC. - EGD (11/29/2021): Normal esophagus.  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  Distal bulb and second portion of duodenum appeared normal. - LMP 4 to 5 years ago.  Denies BRBPR/melena. - Takes iron tablet every other day since March 2023.  Positive for ice pica.  2.  Social/family history: - Works as a [employer] manager.  Non-smoker. - No family history of malignancy.  Plan:  1.  Iron deficiency anemia: - On oral iron for several years without improvement of her iron levels. -She received 1 g INFeD on 04/17/2023 and tolerated well. -Repeat labs from 05/30/2023 show a ferritin of 170 (16), iron saturation 35% (10%), hemoglobin 11.0 (8.8), MCV 94 (87) within normal differential. -No additional IV iron needed at this time. -Recommend follow-up in 4 months with labs a few days before and telephone visit.  PLAN SUMMARY: >> No additional IV iron needed at this time. >> Recommend follow-up in 4 months with labs a few days before. >> ***     No orders of the defined types were placed in this encounter.  I spent 25 minutes dedicated to the care of this patient (face-to-face and non-face-to-face) on the date of the encounter to include what is described in the assessment and plan.   Mauro Kaufmann, NP   10/4/20248:19 AM  CHIEF COMPLAINT/PURPOSE OF CONSULT:   Diagnosis: Iron deficiency  anemia  Current Therapy: Parenteral iron therapy  HISTORY OF PRESENT ILLNESS:   [name] is a 52 y.o. female presenting to clinic today for follow-up for iron deficiency anemia.  She was last evaluated by Dr. Ellin Saba on 04/11/2023.  She received 1 g INFeD on 04/17/2023 with good tolerance.  She is here to review most recent lab results.  In the interim, she denies any hospitalizations, surgeries or changes in her baseline health.  Denies any bleeding, bright red blood per rectum, melena or hematochezia.  Today, she states that she is doing well overall. Her appetite level is at 60%. Her energy level is at 10%.   PAST MEDICAL HISTORY:   Past Medical History: Past Medical History:  Diagnosis Date   Anemia    Arthritis    Chronic back pain    PAF (paroxysmal atrial fibrillation) (HCC)    a. s/p DCCV in 10/2021    Surgical History: Past Surgical History:  Procedure Laterality Date   BIOPSY  11/29/2021   Procedure: BIOPSY;  Surgeon: Corbin Ade, MD;  Location: [facility];  Service: Endoscopy;;   CHOLECYSTECTOMY     ESOPHAGOGASTRODUODENOSCOPY (EGD) WITH PROPOFOL N/A 11/29/2021   Surgeon: Corbin Ade, MD; extensive gastric ulceration extending into the duodenal bulb with secondary pyloric stenosis and retained gastric contents.  Pyloric channel dilated nicely with scope passage only. Gastric mucosa was biopsied and negative for H. pylori, malignancy, dysplasia.   REPLACEMENT TOTAL KNEE BILATERAL     TUBAL LIGATION      Social History: Social History   Socioeconomic  History   Marital status: Legally Separated    Spouse name: Not on file   Number of children: Not on file   Years of education: Not on file   Highest education level: Not on file  Occupational History   Not on file  Tobacco Use   Smoking status: Former    Current packs/day: 0.00    Average packs/day: 0.5 packs/day for 20.0 years (10.0 ttl pk-yrs)    Types: Cigarettes    Start date: 11/29/2001     Quit date: 11/29/2021    Years since quitting: 1.5   Smokeless tobacco: Never  Vaping Use   Vaping status: Former  Substance and Sexual Activity   Alcohol use: No   Drug use: No   Sexual activity: Yes    Birth control/protection: None, Surgical  Other Topics Concern   Not on file  Social History Narrative   Not on file   Social Determinants of Health   Financial Resource Strain: Not on file  Food Insecurity: Not on file  Transportation Needs: Not on file  Physical Activity: Not on file  Stress: Not on file  Social Connections: Not on file  Intimate Partner Violence: Not on file    Family History: Family History  Problem Relation Age of Onset   Diabetes Mother    Stroke Father    Colon cancer Neg Hx    Inflammatory bowel disease Neg Hx     Current Medications:  Current Outpatient Medications:    albuterol (VENTOLIN HFA) 108 (90 Base) MCG/ACT inhaler, Inhale 2 puffs into the lungs every 6 (six) hours as needed for wheezing or shortness of breath., Disp: , Rfl:    ferrous sulfate 324 MG TBEC, Take 324 mg by mouth., Disp: , Rfl:    HYDROcodone-acetaminophen (NORCO) 7.5-325 MG tablet, Take 1 tablet by mouth every 6 (six) hours as needed for moderate pain (Must last 30 days)., Disp: 36 tablet, Rfl: 0   MELATONIN PO, Take 1 tablet by mouth daily as needed (sleep)., Disp: , Rfl:    Menthol, Topical Analgesic, (BIOFREEZE ROLL-ON EX), Apply topically., Disp: , Rfl:    metoprolol tartrate (LOPRESSOR) 25 MG tablet, TAKE ONE TABLET TWICE DAILY. NEEDS TO BE SEEN FOR REFILLS, Disp: 15 tablet, Rfl: 0   ondansetron (ZOFRAN) 4 MG tablet, Take 1 tablet (4 mg total) by mouth every 8 (eight) hours as needed for nausea or vomiting., Disp: 30 tablet, Rfl: 1   pantoprazole (PROTONIX) 40 MG tablet, TAKE ONE TABLET TWICE DAILY BEFORE MEALS, Disp: 15 tablet, Rfl: 0   Allergies: Allergies  Allergen Reactions   Penicillins Nausea And Vomiting    REVIEW OF SYSTEMS:   Review of Systems   Constitutional:  Negative for chills, fatigue and fever.  HENT:   Negative for lump/mass, mouth sores, nosebleeds, sore throat and trouble swallowing.   Eyes:  Negative for eye problems.  Respiratory:  Negative for cough and shortness of breath.   Cardiovascular:  Negative for chest pain, leg swelling and palpitations.  Gastrointestinal:  Negative for abdominal pain, constipation, diarrhea, nausea and vomiting.  Genitourinary:  Negative for bladder incontinence, difficulty urinating, dysuria, frequency, hematuria and nocturia.   Musculoskeletal:  Negative for arthralgias, back pain, flank pain, myalgias and neck pain.  Skin:  Negative for itching and rash.  Neurological:  Positive for headaches and numbness. Negative for dizziness.  Hematological:  Does not bruise/bleed easily.  Psychiatric/Behavioral:  Positive for sleep disturbance. Negative for depression and suicidal ideas. The patient is not  nervous/anxious.   All other systems reviewed and are negative.    VITALS:   Last menstrual period 09/16/2014.  Wt Readings from Last 3 Encounters:  04/16/23 172 lb (78 kg)  04/11/23 168 lb (76.2 kg)  03/14/23 171 lb (77.6 kg)    There is no height or weight on file to calculate BMI.   PHYSICAL EXAM:   Physical Exam Vitals and nursing note reviewed. Exam conducted with a chaperone present.  Constitutional:      Appearance: Normal appearance.  Cardiovascular:     Rate and Rhythm: Normal rate and regular rhythm.     Pulses: Normal pulses.     Heart sounds: Normal heart sounds.  Pulmonary:     Effort: Pulmonary effort is normal.     Breath sounds: Normal breath sounds.  Abdominal:     Palpations: Abdomen is soft. There is no hepatomegaly, splenomegaly or mass.     Tenderness: There is no abdominal tenderness.  Musculoskeletal:     Right lower leg: No edema.     Left lower leg: No edema.  Lymphadenopathy:     Cervical: No cervical adenopathy.     Right cervical: No superficial,  deep or posterior cervical adenopathy.    Left cervical: No superficial, deep or posterior cervical adenopathy.     Upper Body:     Right upper body: No supraclavicular or axillary adenopathy.     Left upper body: No supraclavicular or axillary adenopathy.  Neurological:     General: No focal deficit present.     Mental Status: She is alert and oriented to person, place, and time.  Psychiatric:        Mood and Affect: Mood normal.        Behavior: Behavior normal.     LABS:      Latest Ref Rng & Units 05/30/2023    8:48 AM 03/21/2023    8:46 AM 03/14/2023   10:07 AM  CBC  WBC 3.4 - 10.8 x10E3/uL 7.5  5.5  6.9   Hemoglobin 11.1 - 15.9 g/dL 16.1  8.8  8.4   Hematocrit 34.0 - 46.6 % 35.3  29.5  27.8   Platelets 150 - 450 x10E3/uL 255  357  345       Latest Ref Rng & Units 03/21/2023    8:46 AM 03/14/2023   10:12 AM 11/08/2022   10:17 AM  CMP  Glucose 70 - 99 mg/dL 83  80  77   BUN 6 - 24 mg/dL 19  18  19    Creatinine 0.57 - 1.00 mg/dL 0.96  0.45  4.09   Sodium 134 - 144 mmol/L 142  142  140   Potassium 3.5 - 5.2 mmol/L 5.0  4.6  4.9   Chloride 96 - 106 mmol/L 110  109  106   CO2 20 - 29 mmol/L 20  20  21    Calcium 8.7 - 10.2 mg/dL 9.7  9.7  9.7   Total Protein 6.0 - 8.5 g/dL 6.3  6.6  7.3   Total Bilirubin 0.0 - 1.2 mg/dL 0.3  0.3  0.3   Alkaline Phos 44 - 121 IU/L 154  161  109   AST 0 - 40 IU/L 18  14  16    ALT 0 - 32 IU/L 20  39  14      No results found for: "CEA1", "CEA" / No results found for: "CEA1", "CEA" No results found for: "PSA1" No results found for: "WJX914" No results  found for: "CAN125"  No results found for: "TOTALPROTELP", "ALBUMINELP", "A1GS", "A2GS", "BETS", "BETA2SER", "GAMS", "MSPIKE", "SPEI" Lab Results  Component Value Date   TIBC 187 (L) 05/30/2023   TIBC 293 03/21/2023   TIBC 290 03/14/2023   FERRITIN 170 (H) 05/30/2023   FERRITIN 16 03/21/2023   FERRITIN 23 03/14/2023   IRONPCTSAT 35 05/30/2023   IRONPCTSAT 10 (L) 03/21/2023    IRONPCTSAT 6 (LL) 03/14/2023   No results found for: "LDH"   STUDIES:   No results found.

## 2023-06-13 ENCOUNTER — Ambulatory Visit: Payer: BC Managed Care – PPO | Admitting: Family Medicine

## 2023-06-16 ENCOUNTER — Encounter: Payer: Self-pay | Admitting: Family Medicine

## 2023-06-23 ENCOUNTER — Telehealth: Payer: Self-pay | Admitting: Orthopaedic Surgery

## 2023-06-23 MED ORDER — HYDROCODONE-ACETAMINOPHEN 7.5-325 MG PO TABS: 1.0000 | ORAL_TABLET | Freq: Four times a day (QID) | ORAL | 0 refills | Status: DC | PRN

## 2023-07-16 ENCOUNTER — Encounter: Payer: Self-pay | Admitting: Orthopaedic Surgery

## 2023-07-16 ENCOUNTER — Ambulatory Visit: Payer: BC Managed Care – PPO | Admitting: Orthopaedic Surgery

## 2023-07-16 ENCOUNTER — Other Ambulatory Visit (INDEPENDENT_AMBULATORY_CARE_PROVIDER_SITE_OTHER): Payer: BC Managed Care – PPO

## 2023-07-16 VITALS — BP 119/79 | HR 67 | Ht 68.0 in | Wt 177.0 lb

## 2023-07-16 DIAGNOSIS — M545 Low back pain, unspecified: Secondary | ICD-10-CM

## 2023-07-16 DIAGNOSIS — G8929 Other chronic pain: Secondary | ICD-10-CM

## 2023-07-16 DIAGNOSIS — M25561 Pain in right knee: Secondary | ICD-10-CM

## 2023-07-16 DIAGNOSIS — T84032A Mechanical loosening of internal right knee prosthetic joint, initial encounter: Secondary | ICD-10-CM

## 2023-07-16 DIAGNOSIS — Z96651 Presence of right artificial knee joint: Secondary | ICD-10-CM

## 2023-07-16 NOTE — Progress Notes (Signed)
My right knee is hurting.  She had a total knee on the right about 15 years ago by Dr. Priscille Kluver who is now retired.  She has no trauma.  She has noticed the lateral side is bowing out more and she has swelling and pain.  She has no trauma.  She has no redness.  She has lower back pain that is stable but has some pain on the right side at times. She has no weakness or trauma.  X-rays were done of the right knee, reported separately.  Right knee has well healed midline old scar.  She has sight effusion of the knee.  It has a bow knee appearance of the knee and very slight crepitus.  NV intact.  No distal edema.    Encounter Diagnoses  Name Primary?   Chronic midline low back pain without sciatica Yes   S/P TKR (total knee replacement), right    Chronic pain of right knee    X-rays show loosening of the tibial component.  I will have her seen by Dr. Roda Shutters at the [facility] office for evaluation of revision of the total knee.  She appears to understand.  Call if any problem.  Precautions discussed.  Electronically Signed Darreld Mclean, MD 11/13/202410:18 AM

## 2023-07-16 NOTE — Addendum Note (Signed)
Addended by: Michaele Offer on: 07/16/2023 10:50 AM   Modules accepted: Orders

## 2023-07-22 ENCOUNTER — Telehealth: Payer: Self-pay | Admitting: Orthopaedic Surgery

## 2023-07-23 MED ORDER — HYDROCODONE-ACETAMINOPHEN 7.5-325 MG PO TABS: 1.0000 | ORAL_TABLET | Freq: Four times a day (QID) | ORAL | 0 refills | Status: DC | PRN

## 2023-08-08 ENCOUNTER — Ambulatory Visit: Payer: BC Managed Care – PPO | Admitting: Orthopaedic Surgery

## 2023-08-14 ENCOUNTER — Ambulatory Visit: Payer: BC Managed Care – PPO | Admitting: Orthopaedic Surgery

## 2023-08-18 ENCOUNTER — Telehealth: Payer: Self-pay | Admitting: Orthopaedic Surgery

## 2023-08-18 MED ORDER — HYDROCODONE-ACETAMINOPHEN 7.5-325 MG PO TABS: 1.0000 | ORAL_TABLET | Freq: Four times a day (QID) | ORAL | 0 refills | Status: DC | PRN

## 2023-09-12 ENCOUNTER — Ambulatory Visit: Payer: BC Managed Care – PPO | Admitting: Orthopaedic Surgery

## 2023-09-17 ENCOUNTER — Encounter: Payer: Self-pay | Admitting: Orthopaedic Surgery

## 2023-09-17 ENCOUNTER — Ambulatory Visit: Payer: BC Managed Care – PPO | Admitting: Orthopaedic Surgery

## 2023-09-17 ENCOUNTER — Encounter: Payer: Self-pay | Admitting: Hematology

## 2023-09-17 VITALS — BP 126/79 | HR 60 | Ht 68.0 in

## 2023-09-17 DIAGNOSIS — M25561 Pain in right knee: Secondary | ICD-10-CM | POA: Diagnosis not present

## 2023-09-17 DIAGNOSIS — G8929 Other chronic pain: Secondary | ICD-10-CM

## 2023-09-17 NOTE — Progress Notes (Signed)
 My knee hurts.  She will need total knee but her insurance requires her to go to [place]  or [place]  to have the surgery done.  She is working on getting that arranged.  The right knee has effusion, crepitus, ROM 0 to 105, medial joint line pain, limp right, NV intact.  Encounter Diagnosis  Name Primary?   Chronic pain of right knee Yes   I will see her back in three months.  Call if any problem.  Precautions discussed.  Electronically Signed Pleasant Brilliant, MD 1/15/20259:06 AM

## 2023-09-21 ENCOUNTER — Telehealth: Payer: Self-pay | Admitting: Orthopaedic Surgery

## 2023-09-22 MED ORDER — HYDROCODONE-ACETAMINOPHEN 7.5-325 MG PO TABS: 1.0000 | ORAL_TABLET | Freq: Four times a day (QID) | ORAL | 0 refills | Status: DC | PRN

## 2023-10-20 ENCOUNTER — Telehealth: Payer: Self-pay | Admitting: Orthopaedic Surgery

## 2023-10-21 MED ORDER — HYDROCODONE-ACETAMINOPHEN 7.5-325 MG PO TABS: 1.0000 | ORAL_TABLET | Freq: Four times a day (QID) | ORAL | 0 refills | Status: DC | PRN

## 2023-11-17 ENCOUNTER — Telehealth: Payer: Self-pay | Admitting: Orthopaedic Surgery

## 2023-11-17 MED ORDER — HYDROCODONE-ACETAMINOPHEN 7.5-325 MG PO TABS: 1.0000 | ORAL_TABLET | Freq: Four times a day (QID) | ORAL | 0 refills | Status: DC | PRN

## 2023-12-17 ENCOUNTER — Other Ambulatory Visit (INDEPENDENT_AMBULATORY_CARE_PROVIDER_SITE_OTHER)

## 2023-12-17 ENCOUNTER — Ambulatory Visit (INDEPENDENT_AMBULATORY_CARE_PROVIDER_SITE_OTHER): Payer: BC Managed Care – PPO | Admitting: Orthopaedic Surgery

## 2023-12-17 ENCOUNTER — Encounter: Payer: Self-pay | Admitting: Orthopaedic Surgery

## 2023-12-17 VITALS — BP 116/62 | HR 64 | Ht 67.5 in | Wt 184.0 lb

## 2023-12-17 DIAGNOSIS — G8929 Other chronic pain: Secondary | ICD-10-CM

## 2023-12-17 DIAGNOSIS — M545 Low back pain, unspecified: Secondary | ICD-10-CM | POA: Diagnosis not present

## 2023-12-17 DIAGNOSIS — M79642 Pain in left hand: Secondary | ICD-10-CM | POA: Diagnosis not present

## 2023-12-17 MED ORDER — HYDROCODONE-ACETAMINOPHEN 7.5-325 MG PO TABS: 1.0000 | ORAL_TABLET | Freq: Four times a day (QID) | ORAL | 0 refills | Status: DC | PRN

## 2023-12-17 NOTE — Progress Notes (Signed)
 I hurt all over.  She has chronic right knee pain after a total knee.  She is limping.  Her insurance company requires her to go out of state for any possible surgery or evaluation for this.    She limps because of the knee pain.  She has developed lower back pain.  It is centralized to the lower back with no radiation.  She has no trauma.  She has pain medicine which does not help now.  She has also developed pain in the left hand over the MCP at times of the long finger.  Her back is tender on the lower part.  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  NV intact. ROM is good but painful.  Muscle tone and strength are normal.  She has a limp to the right, pronounced.  Left hand has full ROM and no swelling but painful over the MCP joint.  X-rays were done of the left hand and lower spine, reported separately.  Encounter Diagnoses  Name Primary?   Chronic midline low back pain without sciatica Yes   Pain in left hand    I have reviewed the [place]  Controlled Substance Reporting System web site prior to prescribing narcotic medicine for this patient.  I will begin PT in [place].    She needs to address the right knee pain.  Her limping makes her back worse.  Return in three weeks.  Call if any problem.  Precautions discussed.  Electronically Signed Pleasant Brilliant, MD 4/16/202512:03 PM

## 2024-01-02 ENCOUNTER — Ambulatory Visit

## 2024-01-11 ENCOUNTER — Telehealth: Payer: Self-pay | Admitting: Orthopaedic Surgery

## 2024-01-12 MED ORDER — HYDROCODONE-ACETAMINOPHEN 7.5-325 MG PO TABS: 1.0000 | ORAL_TABLET | Freq: Four times a day (QID) | ORAL | 0 refills | Status: DC | PRN

## 2024-01-13 ENCOUNTER — Encounter (HOSPITAL_COMMUNITY): Payer: Self-pay

## 2024-01-13 ENCOUNTER — Emergency Department (HOSPITAL_COMMUNITY)
Admission: EM | Admit: 2024-01-13 | Discharge: 2024-01-13 | Disposition: A | Discharge status: Home / Self Care | Attending: Emergency Medicine | Admitting: Emergency Medicine

## 2024-01-13 ENCOUNTER — Emergency Department (HOSPITAL_COMMUNITY)

## 2024-01-13 ENCOUNTER — Other Ambulatory Visit: Payer: Self-pay

## 2024-01-13 DIAGNOSIS — R7989 Other specified abnormal findings of blood chemistry: Secondary | ICD-10-CM

## 2024-01-13 DIAGNOSIS — R600 Localized edema: Secondary | ICD-10-CM | POA: Diagnosis not present

## 2024-01-13 DIAGNOSIS — R0789 Other chest pain: Secondary | ICD-10-CM | POA: Insufficient documentation

## 2024-01-13 DIAGNOSIS — R079 Chest pain, unspecified: Secondary | ICD-10-CM

## 2024-01-13 DIAGNOSIS — Z79899 Other long term (current) drug therapy: Secondary | ICD-10-CM | POA: Diagnosis not present

## 2024-01-13 DIAGNOSIS — M7989 Other specified soft tissue disorders: Secondary | ICD-10-CM | POA: Insufficient documentation

## 2024-01-13 DIAGNOSIS — Z7982 Long term (current) use of aspirin: Secondary | ICD-10-CM | POA: Insufficient documentation

## 2024-01-13 HISTORY — DX: Gastric ulcer, unspecified as acute or chronic, without hemorrhage or perforation: K25.9

## 2024-01-13 LAB — CBC WITH DIFFERENTIAL/PLATELET
Abs Immature Granulocytes: 0.01 10*3/uL (ref 0.00–0.07)
Basophils Absolute: 0 10*3/uL (ref 0.0–0.1)
Basophils Relative: 0 %
Eosinophils Absolute: 0.1 10*3/uL (ref 0.0–0.5)
Eosinophils Relative: 1 %
HCT: 28.5 % — ABNORMAL LOW (ref 36.0–46.0)
Hemoglobin: 9.2 g/dL — ABNORMAL LOW (ref 12.0–15.0)
Immature Granulocytes: 0 %
Lymphocytes Relative: 22 %
Lymphs Abs: 1.5 10*3/uL (ref 0.7–4.0)
MCH: 31.2 pg (ref 26.0–34.0)
MCHC: 32.3 g/dL (ref 30.0–36.0)
MCV: 96.6 fL (ref 80.0–100.0)
Monocytes Absolute: 0.4 10*3/uL (ref 0.1–1.0)
Monocytes Relative: 6 %
Neutro Abs: 4.9 10*3/uL (ref 1.7–7.7)
Neutrophils Relative %: 71 %
Platelets: 366 10*3/uL (ref 150–400)
RBC: 2.95 MIL/uL — ABNORMAL LOW (ref 3.87–5.11)
RDW: 14.7 % (ref 11.5–15.5)
WBC: 6.9 10*3/uL (ref 4.0–10.5)
nRBC: 0 % (ref 0.0–0.2)

## 2024-01-13 LAB — COMPREHENSIVE METABOLIC PANEL WITH GFR
ALT: 11 U/L (ref 0–44)
AST: 21 U/L (ref 15–41)
Albumin: 2.5 g/dL — ABNORMAL LOW (ref 3.5–5.0)
Alkaline Phosphatase: 81 U/L (ref 38–126)
Anion gap: 11 (ref 5–15)
BUN: 19 mg/dL (ref 6–20)
CO2: 22 mmol/L (ref 22–32)
Calcium: 10 mg/dL (ref 8.9–10.3)
Chloride: 110 mmol/L (ref 98–111)
Creatinine, Ser: 0.87 mg/dL (ref 0.44–1.00)
GFR, Estimated: >60 mL/min (ref >60)
Glucose, Bld: 82 mg/dL (ref 70–99)
Potassium: 3.9 mmol/L (ref 3.5–5.1)
Sodium: 143 mmol/L (ref 135–145)
Total Bilirubin: 0.4 mg/dL (ref 0.0–1.2)
Total Protein: 6.3 g/dL — ABNORMAL LOW (ref 6.5–8.1)

## 2024-01-13 LAB — BRAIN NATRIURETIC PEPTIDE: B Natriuretic Peptide: 316 pg/mL — ABNORMAL HIGH (ref 0.0–100.0)

## 2024-01-13 LAB — TROPONIN I (HIGH SENSITIVITY)
Troponin I (High Sensitivity): 6 ng/L (ref <18)
Troponin I (High Sensitivity): 5 ng/L (ref <18)

## 2024-01-13 MED ORDER — ASPIRIN 81 MG PO CHEW
324.0000 mg | CHEWABLE_TABLET | Freq: Once | ORAL | Status: DC
  Filled 2024-01-13: qty 4

## 2024-01-13 NOTE — ED Triage Notes (Signed)
 Pt arrived via POV from [facility] in [facility] for further evaluation of Left Chest pain. Pt reports pain has been present X 3 days.
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
## 2024-01-13 NOTE — Discharge Instructions (Signed)
You were seen for your chest pain in the emergency department.   At home, please take your medications as prescribed.    Follow-up with your primary doctor in 2-3 days regarding your visit.  Cardiology will be calling you regarding an appointment within the next 72 hours.  You may contact them if you do not hear from them in that time using the information in this packet.  Return immediately to the emergency department if you experience any of the following: Worsening pain, difficulty breathing, unexplained vomiting or sweating, or any other concerning symptoms.    Thank you for visiting our Emergency Department. It was a pleasure taking care of you today.

## 2024-01-23 ENCOUNTER — Ambulatory Visit: Admitting: Family Medicine

## 2024-01-23 ENCOUNTER — Encounter: Payer: Self-pay | Admitting: Family Medicine

## 2024-01-23 ENCOUNTER — Other Ambulatory Visit

## 2024-01-23 VITALS — BP 105/75 | HR 66 | Temp 97.4°F | Ht 67.5 in | Wt 176.4 lb

## 2024-01-23 DIAGNOSIS — M2550 Pain in unspecified joint: Secondary | ICD-10-CM | POA: Insufficient documentation

## 2024-01-23 DIAGNOSIS — D508 Other iron deficiency anemias: Secondary | ICD-10-CM

## 2024-01-23 DIAGNOSIS — R2232 Localized swelling, mass and lump, left upper limb: Secondary | ICD-10-CM

## 2024-01-23 DIAGNOSIS — M7989 Other specified soft tissue disorders: Secondary | ICD-10-CM | POA: Insufficient documentation

## 2024-01-23 DIAGNOSIS — R079 Chest pain, unspecified: Secondary | ICD-10-CM

## 2024-01-23 DIAGNOSIS — R002 Palpitations: Secondary | ICD-10-CM

## 2024-01-23 DIAGNOSIS — R5383 Other fatigue: Secondary | ICD-10-CM

## 2024-01-23 DIAGNOSIS — R7989 Other specified abnormal findings of blood chemistry: Secondary | ICD-10-CM | POA: Diagnosis not present

## 2024-01-23 DIAGNOSIS — I480 Paroxysmal atrial fibrillation: Secondary | ICD-10-CM

## 2024-01-23 NOTE — Progress Notes (Signed)
 Established Patient Office Visit  Subjective   Patient ID: [name], female    DOB: Jul 09, 1971  Age: 53 y.o. MRN: [id number]  Chief Complaint  Patient presents with   Edema    HPI [name] is here for a ER follow up. She was seen in the ER at [facility] on 01/13/24 for chest pain. Per ER HPI:  "[name] is a 53 y.o. female.  She was sent from urgent care for evaluation of chest pain.  She said she has had some sharp stabbing left-sided chest pain that is been going on and off for 3 days.  Can last for about 30 seconds at a time.  Comes and goes.  Not precipitated by exertion.  She denies prior history of same.  No known trauma.  Not associated with shortness of breath.  Is associated with some swelling of her legs and hands.  Prior history of atrial fibrillation, not on anticoagulation."  She had a CXR with no acute findings. BNP wa marginally elevated at 316. Troponin was normal. No edema or pulmonary congestion was noted on exam. CBC with anemia, hx of chronic anemia. EKG with sinus rhythm.   Reports pain in bilateral hands, wrists, ankles, feet for years. Has swelling his hands, legs, and feet for about 1 month that has been intermittent. Has had this swelling past and when she has the swelling in her hands she get nodules on her DIP joints. Swelling improves some with elevation. 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 Has had xray of her left hand before that was normal.   Continues to have sharp intermittent in her left chest that lasts for less than 1 minute. Pain occurs randomly. Sometimes with activity and sometimes with rest. Hx of a. Fib that required prior cardioversion. Denies shortness of breath, orthopnea, dizziness, lightheadedness. Sometimes feels that her hear flutters randomly for less than a minutes without any other symptoms. Feels chronically tired and generally weak. Sleeps a few hours at a time and then wakes up. Usually falls back asleep within 20 minutes. She isn't  sure is she snores as she lives alone.   Has appt with cardiology in August.      ROS As per HPI.    Objective:     BP 105/75   Pulse 66   Temp (!) 97.4 F (36.3 C) (Temporal)   Ht 5' 7.5" (1.715 m)   Wt 176 lb 6.4 oz (80 kg)   LMP 09/16/2014 Comment: NEG U PREG 1/312/16  SpO2 98%   BMI 27.22 kg/m  Wt Readings from Last 3 Encounters:  01/23/24 176 lb 6.4 oz (80 kg)  01/13/24 184 lb 1.4 oz (83.5 kg)  12/17/23 184 lb (83.5 kg)      Physical Exam Vitals and nursing note reviewed.  Constitutional:      General: She is not in acute distress.    Appearance: She is not ill-appearing, toxic-appearing or diaphoretic.  Cardiovascular:     Rate and Rhythm: Normal rate and regular rhythm.     Heart sounds: Normal heart sounds. No murmur heard. Pulmonary:     Effort: Pulmonary effort is normal. No respiratory distress.     Breath sounds: Normal breath sounds. No wheezing, rhonchi or rales.  Musculoskeletal:     Right wrist: No swelling, deformity or tenderness.     Left wrist: No swelling, deformity or tenderness.     Right lower leg: No edema.     Left lower leg: No  edema.     Right ankle: No swelling.     Left ankle: No swelling.     Comments: Generalized swelling to bilateral hands with generalized tenderness. No erythema. Decreased grip strength bilaterally. Nodule present to left 1st finger DIP.   Skin:    General: Skin is warm and dry.  Neurological:     Mental Status: She is alert and oriented to person, place, and time.     Gait: Gait abnormal (using cane for ambulation).  Psychiatric:        Mood and Affect: Mood normal.        Behavior: Behavior normal.      No results found for any visits on 01/23/24.    The 10-year ASCVD risk score (Arnett DK, et al., 2019) is: 1%    Assessment & Plan:   [name] was seen today for edema.  Diagnoses and all orders for this visit:  Nonspecific chest pain Reviewed ER note, labs, imaging report. Has appt with  cardiology for evaluation in August. Zio discussed and applied today. Labs pending as below.  -     LONG TERM MONITOR (3-14 DAYS); Future -     Pro b natriuretic peptide -     Iron , TIBC and Ferritin Panel -     TSH + free T4  Elevated brain natriuretic peptide (BNP) level Will recheck. No lower leg edema on exam today. Discussed echo if elevated on repeat.  -     Pro b natriuretic peptide  Palpitations Zio applied. Hx of A. Fib with RVR with cardioversion. RRR today on exam.  -     LONG TERM MONITOR (3-14 DAYS); Future  Paroxysmal atrial fibrillation with RVR (HCC) -     LONG TERM MONITOR (3-14 DAYS); Future  Other iron  deficiency anemia Hx of iron  infusion. Hgb low on recent CBC. Repeat with iron  panel pending. Discussed hematology follow up pending lsb.  -     CBC with Differential/Platelet -     Iron , TIBC and Ferritin Panel  Nodule of finger of left hand Arthralgia of multiple sites Swelling of both hands ? Autoimmune etiology. Labs pending as below.  -     ANA Comprehensive Panel -     Sedimentation Rate -     C-reactive protein -     Uric Acid -     TSH + free T4  Fatigue, unspecified type Labs pending.  -     ANA Comprehensive Panel -     CBC with Differential/Platelet -     Iron , TIBC and Ferritin Panel -     Vitamin B12  Return in about 3 months (around 04/24/2024) for chronic follow up.   Total time spent caring for the patient today was 42 minutes. This includes time spent before the visit reviewing the chart, time spent during the visit, and time spent after the visit on documentation.  The patient indicates understanding of these issues and agrees with the plan.  Tami Huger, FNP

## 2024-01-28 ENCOUNTER — Ambulatory Visit: Payer: Self-pay | Admitting: Family Medicine

## 2024-01-28 DIAGNOSIS — R600 Localized edema: Secondary | ICD-10-CM

## 2024-01-28 DIAGNOSIS — R7989 Other specified abnormal findings of blood chemistry: Secondary | ICD-10-CM

## 2024-01-28 DIAGNOSIS — D508 Other iron deficiency anemias: Secondary | ICD-10-CM

## 2024-01-28 MED ORDER — POTASSIUM CHLORIDE CRYS ER 10 MEQ PO TBCR: 10.0000 meq | EXTENDED_RELEASE_TABLET | Freq: Two times a day (BID) | ORAL | 3 refills | Status: DC

## 2024-01-28 MED ORDER — FUROSEMIDE 20 MG PO TABS: 20.0000 mg | ORAL_TABLET | Freq: Every day | ORAL | 3 refills | Status: AC

## 2024-01-29 LAB — ANA COMPREHENSIVE PANEL
Anti JO-1: <0.2 AI (ref 0.0–0.9)
Centromere Ab Screen: <0.2 AI (ref 0.0–0.9)
Chromatin Ab SerPl-aCnc: <0.2 AI (ref 0.0–0.9)
ENA RNP Ab: 0.2 AI (ref 0.0–0.9)
ENA SM Ab Ser-aCnc: <0.2 AI (ref 0.0–0.9)
ENA SSA (RO) Ab: <0.2 AI (ref 0.0–0.9)
ENA SSB (LA) Ab: <0.2 AI (ref 0.0–0.9)
Scleroderma (Scl-70) (ENA) Antibody, IgG: <0.2 AI (ref 0.0–0.9)
dsDNA Ab: <1 [IU]/mL (ref 0–9)

## 2024-01-29 LAB — SEDIMENTATION RATE: Sed Rate: 98 mm/h — ABNORMAL HIGH (ref 0–40)

## 2024-01-29 LAB — CBC WITH DIFFERENTIAL/PLATELET
Basophils Absolute: 0 10*3/uL (ref 0.0–0.2)
Basos: 1 %
EOS (ABSOLUTE): 0 10*3/uL (ref 0.0–0.4)
Eos: 1 %
Hematocrit: 32.4 % — ABNORMAL LOW (ref 34.0–46.6)
Hemoglobin: 10.3 g/dL — ABNORMAL LOW (ref 11.1–15.9)
Immature Grans (Abs): 0 10*3/uL (ref 0.0–0.1)
Immature Granulocytes: 0 %
Lymphocytes Absolute: 1.4 10*3/uL (ref 0.7–3.1)
Lymphs: 23 %
MCH: 30.3 pg (ref 26.6–33.0)
MCHC: 31.8 g/dL (ref 31.5–35.7)
MCV: 95 fL (ref 79–97)
Monocytes Absolute: 0.3 10*3/uL (ref 0.1–0.9)
Monocytes: 5 %
Neutrophils Absolute: 4.4 10*3/uL (ref 1.4–7.0)
Neutrophils: 70 %
Platelets: 445 10*3/uL (ref 150–450)
RBC: 3.4 x10E6/uL — ABNORMAL LOW (ref 3.77–5.28)
RDW: 13.3 % (ref 11.7–15.4)
WBC: 6.3 10*3/uL (ref 3.4–10.8)

## 2024-01-29 LAB — TSH+FREE T4
Free T4: 1.11 ng/dL (ref 0.82–1.77)
TSH: 1.89 u[IU]/mL (ref 0.450–4.500)

## 2024-01-29 LAB — IRON,TIBC AND FERRITIN PANEL
Ferritin: 202 ng/mL — ABNORMAL HIGH (ref 15–150)
Iron Saturation: 10 % — ABNORMAL LOW (ref 15–55)
Iron: 20 ug/dL — ABNORMAL LOW (ref 27–159)
Total Iron Binding Capacity: 208 ug/dL — ABNORMAL LOW (ref 250–450)
UIBC: 188 ug/dL (ref 131–425)

## 2024-01-29 LAB — RHEUMASSURE

## 2024-01-29 LAB — C-REACTIVE PROTEIN: CRP: 85 mg/L — ABNORMAL HIGH (ref 0–10)

## 2024-01-29 LAB — URIC ACID: Uric Acid: 5 mg/dL (ref 3.0–7.2)

## 2024-01-29 LAB — PRO B NATRIURETIC PEPTIDE: NT-Pro BNP: 1764 pg/mL — ABNORMAL HIGH (ref 0–249)

## 2024-01-29 LAB — VITAMIN B12: Vitamin B-12: 1931 pg/mL — ABNORMAL HIGH (ref 232–1245)

## 2024-02-02 ENCOUNTER — Telehealth: Payer: Self-pay

## 2024-02-02 ENCOUNTER — Other Ambulatory Visit: Payer: Self-pay

## 2024-02-02 ENCOUNTER — Other Ambulatory Visit: Payer: Self-pay | Admitting: Family Medicine

## 2024-02-02 DIAGNOSIS — R600 Localized edema: Secondary | ICD-10-CM

## 2024-02-02 MED ORDER — POTASSIUM CHLORIDE CRYS ER 10 MEQ PO TBCR: 10.0000 meq | EXTENDED_RELEASE_TABLET | Freq: Two times a day (BID) | ORAL | 3 refills | Status: AC

## 2024-02-02 NOTE — Telephone Encounter (Signed)
 Copied from CRM [id number]. Topic: Clinical - Medication Refill >> Feb 02, 2024 11:49 AM Everette C wrote: Medication: furosemide  (LASIX ) 20 MG tablet [[id number]]  Has the patient contacted their pharmacy? Yes (Agent: If no, request that the patient contact the pharmacy for the refill. If patient does not wish to contact the pharmacy document the reason why and proceed with request.) (Agent: If yes, when and what did the pharmacy advise?)  This is the patient's preferred pharmacy:  [facility] [place], [place] - [address] [place] [address] Phone: [phone] Fax: [phone]  Is this the correct pharmacy for this prescription? Yes If no, delete pharmacy and type the correct one.   Has the prescription been filled recently? No  Is the patient out of the medication? Yes  Has the patient been seen for an appointment in the last year OR does the patient have an upcoming appointment? Yes  Can we respond through MyChart? No  Agent: Please be advised that Rx refills may take up to 3 business days. We ask that you follow-up with your pharmacy.

## 2024-02-02 NOTE — Telephone Encounter (Signed)
 Rx sent to correct pharmacy. Left detailed message as requested

## 2024-02-02 NOTE — Telephone Encounter (Signed)
 Copied from CRM [id number]. Topic: Clinical - Prescription Issue >> Feb 02, 2024  8:46 AM [name] wrote: Reason for CRM: Patient is calling because her rx potassium chloride  (KLOR-CON  M) 10 MEQ tablet was sent to the wrong pharmacy, she uses [facility] [place], [place] - [address] [place] [address]BJYNW: [phone] Fax: [phone]: Not open 24 hours  And it was sent to [facility], could you please make the adjustments and once done call the patient back at [phone] once the rx is sent and if she doesn't answer its okay to leave a message.

## 2024-02-03 NOTE — Telephone Encounter (Signed)
 Patient recently had a year supply of furosemide  sent to [facility] pharmacy. [facility] pharmacy and they state they will call and request a transfer from [facility].

## 2024-02-09 ENCOUNTER — Telehealth: Payer: Self-pay | Admitting: Orthopaedic Surgery

## 2024-02-13 ENCOUNTER — Ambulatory Visit: Admitting: Family Medicine

## 2024-02-13 VITALS — BP 104/73 | HR 67 | Temp 98.3°F | Ht 67.5 in | Wt 179.4 lb

## 2024-02-13 DIAGNOSIS — M2550 Pain in unspecified joint: Secondary | ICD-10-CM

## 2024-02-13 DIAGNOSIS — R700 Elevated erythrocyte sedimentation rate: Secondary | ICD-10-CM | POA: Diagnosis not present

## 2024-02-13 DIAGNOSIS — R7989 Other specified abnormal findings of blood chemistry: Secondary | ICD-10-CM | POA: Diagnosis not present

## 2024-02-13 DIAGNOSIS — R7982 Elevated C-reactive protein (CRP): Secondary | ICD-10-CM | POA: Diagnosis not present

## 2024-02-13 DIAGNOSIS — R600 Localized edema: Secondary | ICD-10-CM

## 2024-02-13 NOTE — Progress Notes (Unsigned)
 Established Patient Office Visit  Subjective   Patient ID: [name], female    DOB: 03-16-1971  Age: 53 y.o. MRN: [id number]  Chief Complaint  Patient presents with   Edema    HPI [name] is here for a follow up. She reports some improving in swelling in her hands and lower legs with lasix . She has compliant with this and potassium supplement. She continues to have intermittent swelling in her hands with pain. This is located in different finger joints and wrists. Lower leg swelling increases throughout the day, especially after sitting or standing for awhile. She does act as a Holiday representative and is requesting a note to be allowed to sit as needed while working. She denies chest pain, dyspnea. Continues to feel fatigue. Continues to have some palpitations but no symptoms with this. Zio showed sinus rhythm with only some short runs of SVT- only one of which cause some mild symptoms. She has a echo scheduled next Friday. She has an appt with cardiology for evaluation on 04/09/24.    ROS As per HPI.    Objective:     BP 104/73   Pulse 67   Temp 98.3 F (36.8 C) (Temporal)   Ht 5' 7.5 (1.715 m)   Wt 179 lb 6.4 oz (81.4 kg)   LMP 09/16/2014 Comment: NEG U PREG 1/312/16  SpO2 99%   BMI 27.68 kg/m    Physical Exam Vitals and nursing note reviewed.  Constitutional:      General: She is not in acute distress.    Appearance: She is not ill-appearing, toxic-appearing or diaphoretic.   Cardiovascular:     Rate and Rhythm: Normal rate and regular rhythm.     Heart sounds: Normal heart sounds. No murmur heard. Pulmonary:     Effort: Pulmonary effort is normal. No respiratory distress.     Breath sounds: Normal breath sounds. No wheezing, rhonchi or rales.   Musculoskeletal:     Right wrist: No swelling, deformity or tenderness.     Left wrist: No swelling, deformity or tenderness.     Right lower leg: No edema.     Left lower leg: No edema.     Right ankle: No swelling.      Left ankle: No swelling.     Comments: Mild generalized swelling to bilateral hands with generalized tenderness. No erythema. Decreased grip strength bilaterally.    Skin:    General: Skin is warm and dry.   Neurological:     Mental Status: She is alert and oriented to person, place, and time.     Gait: Gait abnormal (using cane for ambulation).   Psychiatric:        Mood and Affect: Mood normal.        Behavior: Behavior normal.    No results found for any visits on 02/13/24.    The 10-year ASCVD risk score (Arnett DK, et al., 2019) is: 1%    Assessment & Plan:   [name] was seen today for edema.  Diagnoses and all orders for this visit:  Elevated sed rate -     Sedimentation Rate  Elevated C-reactive protein (CRP) -     C-reactive protein  Arthralgia of multiple sites -     C-reactive protein -     Sedimentation Rate  Elevated brain natriuretic peptide (BNP) level  Peripheral edema -     BMP8+EGFR  Will repeat sed rate and CRP. Discussed rheumatology referral if this remains elevated. Discussed zio results.  Discussed compression socks, elevation for peripheral edema. Note provided for work to allow her to sit intermittently. Discussed to avoid sitting or standing for prolonged periods. Will repeat BMP after starting lasix . Continue this daily. Keep appt for echo and cardiology evaluation.   Follow up in 3 months.   The patient indicates understanding of these issues and agrees with the plan.  Tami Huger, FNP

## 2024-02-14 LAB — BMP8+EGFR
BUN/Creatinine Ratio: 17 (ref 9–23)
BUN: 16 mg/dL (ref 6–24)
CO2: 15 mmol/L — ABNORMAL LOW (ref 20–29)
Calcium: 10.1 mg/dL (ref 8.7–10.2)
Chloride: 110 mmol/L — ABNORMAL HIGH (ref 96–106)
Creatinine, Ser: 0.93 mg/dL (ref 0.57–1.00)
Glucose: 85 mg/dL (ref 70–99)
Potassium: 4.5 mmol/L (ref 3.5–5.2)
Sodium: 140 mmol/L (ref 134–144)
eGFR: 73 mL/min/{1.73_m2} (ref >59)

## 2024-02-14 LAB — SEDIMENTATION RATE: Sed Rate: 62 mm/h — ABNORMAL HIGH (ref 0–40)

## 2024-02-14 LAB — C-REACTIVE PROTEIN: CRP: 45 mg/L — ABNORMAL HIGH (ref 0–10)

## 2024-02-16 ENCOUNTER — Encounter: Payer: Self-pay | Admitting: Family Medicine

## 2024-02-16 ENCOUNTER — Ambulatory Visit: Payer: Self-pay | Admitting: Family Medicine

## 2024-02-16 DIAGNOSIS — R700 Elevated erythrocyte sedimentation rate: Secondary | ICD-10-CM

## 2024-02-16 DIAGNOSIS — R7982 Elevated C-reactive protein (CRP): Secondary | ICD-10-CM

## 2024-02-16 DIAGNOSIS — M2550 Pain in unspecified joint: Secondary | ICD-10-CM

## 2024-02-20 ENCOUNTER — Ambulatory Visit (HOSPITAL_COMMUNITY)
Admission: RE | Admit: 2024-02-20 | Discharge: 2024-02-20 | Disposition: A | Discharge status: Home / Self Care | Source: Ambulatory Visit | Attending: Family Medicine | Admitting: Family Medicine

## 2024-02-20 DIAGNOSIS — R7989 Other specified abnormal findings of blood chemistry: Secondary | ICD-10-CM | POA: Diagnosis not present

## 2024-02-20 DIAGNOSIS — R600 Localized edema: Secondary | ICD-10-CM

## 2024-02-20 LAB — ECHOCARDIOGRAM COMPLETE
Area-P 1/2: 4.1 cm2
Calc EF: 67.4 %
S' Lateral: 3 cm
Single Plane A2C EF: 63.9 %
Single Plane A4C EF: 67.4 %

## 2024-02-25 ENCOUNTER — Ambulatory Visit: Payer: Self-pay | Admitting: Family Medicine

## 2024-02-25 ENCOUNTER — Telehealth: Payer: Self-pay

## 2024-02-25 NOTE — Telephone Encounter (Signed)
 Copied from CRM [id number]. Topic: Clinical - Lab/Test Results >> Feb 25, 2024  2:13 PM Santiya F wrote: Reason for CRM: Patient is calling in returning a call from [place]. Read note left from provider. Patient had questions on what an impaired left ventricle meant and what she needed to do. Patient is requesting a call back. Patient says it's okay to leave information regarding the results on her voicemail, the line is her personal line.

## 2024-02-26 NOTE — Telephone Encounter (Signed)
 I reviewed echo results with pt per imaging results and pt voiced understanding.

## 2024-02-26 NOTE — Telephone Encounter (Signed)
 Patient returning nurse call. Has questions about imagining results. Please call back.

## 2024-03-01 ENCOUNTER — Other Ambulatory Visit (HOSPITAL_COMMUNITY)

## 2024-03-08 ENCOUNTER — Telehealth: Payer: Self-pay | Admitting: Family Medicine

## 2024-03-08 NOTE — Telephone Encounter (Signed)
 Referral sent to: [facility] [address] - [place] [address] [phone]  MyChart Message sent to Patient with Specialty Office contact information.

## 2024-03-08 NOTE — Telephone Encounter (Unsigned)
 Copied from CRM [id number]. Topic: Referral - Status >> Mar 08, 2024  8:07 AM Marda MATSU wrote: Reason for CRM: Please call Pt [name] in regards to Rheumatology referral

## 2024-03-15 ENCOUNTER — Telehealth: Payer: Self-pay | Admitting: Orthopaedic Surgery

## 2024-03-31 ENCOUNTER — Encounter: Payer: Self-pay | Admitting: Orthopaedic Surgery

## 2024-03-31 ENCOUNTER — Ambulatory Visit (INDEPENDENT_AMBULATORY_CARE_PROVIDER_SITE_OTHER): Admitting: Orthopaedic Surgery

## 2024-03-31 DIAGNOSIS — Z96651 Presence of right artificial knee joint: Secondary | ICD-10-CM

## 2024-03-31 DIAGNOSIS — G8929 Other chronic pain: Secondary | ICD-10-CM

## 2024-03-31 DIAGNOSIS — M79641 Pain in right hand: Secondary | ICD-10-CM | POA: Diagnosis not present

## 2024-03-31 DIAGNOSIS — M545 Low back pain, unspecified: Secondary | ICD-10-CM

## 2024-03-31 DIAGNOSIS — M25561 Pain in right knee: Secondary | ICD-10-CM | POA: Diagnosis not present

## 2024-03-31 NOTE — Progress Notes (Signed)
 My hands hurt.  She complains of nocturnal numbness of the right hand, involving the long finger mostly.  It is not getting better.  She has also some dorsal swelling of both hands in the fourth compartment area.  She has no trauma.  She did not go to PT for her back therapy as she had no transportation.  She is working on getting the [place].  She is in contact with her insurance company and they are working on it.  Her back is slightly better.  She has no weakness.  Right hand has some dorsal swelling over the fourth compartment area but it is not red.  ROM is full.  She has decreased sensation in the median nerve distribution.  Right knee has no effusion but is painful.  She uses a cane and has ROM 0 to 110, knee stable but painful.  No redness.  No distal edema.  Lower back is tender diffusely, ROM is good but slow, muscle tone and strength normal, NV intact.  Encounter Diagnoses  Name Primary?   Pain in right hand Yes   Chronic midline low back pain without sciatica    Chronic pain of right knee    S/P TKR (total knee replacement), right    I will get EMGs of the upper extremities.  I am concerned about carpal tunnel.  She has an appointment to see rheumatology but that is in January, 2026.  Return in one month.  Call if any problem.  Precautions discussed.  Electronically Signed Lemond Stable, MD 7/30/20259:50 AM

## 2024-03-31 NOTE — Patient Instructions (Signed)
 Referral has been placed to Dr. Eldonna office for the nerve conduction. They will call to schedule appt. [phone]  Please follow up with Dr. Brenna in one month.

## 2024-04-09 ENCOUNTER — Telehealth: Payer: Self-pay | Admitting: Family Medicine

## 2024-04-09 ENCOUNTER — Ambulatory Visit: Admitting: Internal Medicine

## 2024-04-09 NOTE — Telephone Encounter (Signed)
 [employer] faxed DISABILTY forms to be completed  Form Fee Paid? (Y/N)     NO / LEFT MESSAGE TO CALL BACK       If NO, form is placed on front office manager desk to hold until payment received. If YES, then form will be placed in the RX/HH Nurse Coordinators box for completion.  Form will not be processed until payment is received

## 2024-04-12 ENCOUNTER — Telehealth: Payer: Self-pay | Admitting: Orthopaedic Surgery

## 2024-04-23 ENCOUNTER — Encounter: Payer: Self-pay | Admitting: Radiology

## 2024-04-28 ENCOUNTER — Ambulatory Visit: Admitting: Orthopaedic Surgery

## 2024-04-30 ENCOUNTER — Encounter: Admitting: Physical Medicine and Rehabilitation

## 2024-05-07 ENCOUNTER — Ambulatory Visit: Admitting: Family Medicine

## 2024-05-07 ENCOUNTER — Encounter: Admitting: Physical Medicine and Rehabilitation

## 2024-05-10 ENCOUNTER — Telehealth: Payer: Self-pay | Admitting: Orthopaedic Surgery

## 2024-05-24 ENCOUNTER — Ambulatory Visit: Admitting: Family Medicine

## 2024-06-07 ENCOUNTER — Other Ambulatory Visit: Payer: Self-pay | Admitting: Orthopedic Surgery

## 2024-06-16 ENCOUNTER — Ambulatory Visit: Admitting: Orthopaedic Surgery

## 2024-06-18 ENCOUNTER — Ambulatory Visit (INDEPENDENT_AMBULATORY_CARE_PROVIDER_SITE_OTHER): Admitting: Orthopedic Surgery

## 2024-06-18 ENCOUNTER — Encounter: Payer: Self-pay | Admitting: Orthopedic Surgery

## 2024-06-18 VITALS — BP 133/82 | HR 70 | Ht 67.0 in | Wt 169.1 lb

## 2024-06-18 DIAGNOSIS — G8929 Other chronic pain: Secondary | ICD-10-CM | POA: Diagnosis not present

## 2024-06-18 DIAGNOSIS — M545 Low back pain, unspecified: Secondary | ICD-10-CM | POA: Diagnosis not present

## 2024-06-18 DIAGNOSIS — Z96651 Presence of right artificial knee joint: Secondary | ICD-10-CM | POA: Diagnosis not present

## 2024-06-18 NOTE — Progress Notes (Signed)
 New Patient Visit  Assessment: [name] is a 53 y.o. female with the following: 1. S/P TKR (total knee replacement), right 2. Chronic midline low back pain without sciatica  Plan: [name] has multiple complaints.  She has chronic low back pain.  She is using a cane to assist with ambulation.  She is also on chronic narcotic therapy.  She has a history of right total knee arthroplasty, which has done well until the last year or so.  There is varus angulation on radiographs, consistent with loosening.  No signs of an infection.  We will place a referral for her to be evaluated for her right knee.  We will also place a referral for pain management.  In regards to her back, I discussed the ongoing concerns that she has.  I think the knee is more pressing than the back.  At some point, she we will need to consider physical therapy for her back, and then proceed with a workup from there.  She states understanding.  She will follow-up as needed.  Follow-up: Return for Referral to Ortho care [facility] for evaluation of painful right total knee arthroplasty.  Subjective:  Chief Complaint  Patient presents with   Back Pain    My back is hurting.    History of Present Illness: [name] is a 53 y.o. female who presents for evaluation of low back pain.  She has had chronic low back pain for a while.  Pain is primarily axial.  There is no radiating pains into her legs.  She is on chronic narcotic therapy.  In addition, she has a painful right knee.  History of right total knee arthroplasty, completed approximately 15 years ago in [place].  Prior surgeon has retired.  She did well initially, but over the past 1-2 years, she has noticed worsening pain and deformity.  She is using a cane to assist with ambulation.  She denies fevers or chills.  No drainage.   Review of Systems: No fevers or chills No numbness or tingling No chest pain No shortness of breath No bowel or  bladder dysfunction No GI distress No headaches   Medical History:  Past Medical History:  Diagnosis Date   Anemia    Arthritis    Chronic back pain    Multiple gastric ulcers    PAF (paroxysmal atrial fibrillation) (HCC)    a. s/p DCCV in 10/2021    Past Surgical History:  Procedure Laterality Date   BIOPSY  11/29/2021   Procedure: BIOPSY;  Surgeon: Shaaron Lamar HERO, MD;  Location: [facility] ENDO [facility];  Service: Endoscopy;;   CHOLECYSTECTOMY     ESOPHAGOGASTRODUODENOSCOPY (EGD) WITH PROPOFOL  N/A 11/29/2021   Surgeon: Shaaron Lamar HERO, MD; extensive gastric ulceration extending into the duodenal bulb with secondary pyloric stenosis and retained gastric contents.  Pyloric channel dilated nicely with scope passage only. Gastric mucosa was biopsied and negative for H. pylori, malignancy, dysplasia.   REPLACEMENT TOTAL KNEE BILATERAL     TUBAL LIGATION      Family History  Problem Relation Age of Onset   Diabetes Mother    Stroke Father    Colon cancer Neg Hx    Inflammatory bowel disease Neg Hx    Social History   Tobacco Use   Smoking status: Former    Current packs/day: 0.00    Average packs/day: 0.5 packs/day for 20.0 years (10.0 ttl pk-yrs)    Types: Cigarettes    Start date: 11/29/2001  Quit date: 11/29/2021    Years since quitting: 2.5    Passive exposure: Current   Smokeless tobacco: Never  Vaping Use   Vaping status: Former  Substance Use Topics   Alcohol use: No   Drug use: No    Allergies  Allergen Reactions   Penicillins Nausea And Vomiting    Current Meds  Medication Sig   albuterol  (VENTOLIN  HFA) 108 (90 Base) MCG/ACT inhaler Inhale 2 puffs into the lungs every 6 (six) hours as needed for wheezing or shortness of breath.   ferrous sulfate 324 MG TBEC Take 324 mg by mouth.   furosemide  (LASIX ) 20 MG tablet Take 1 tablet (20 mg total) by mouth daily.   HYDROcodone -acetaminophen  (NORCO) 7.5-325 MG tablet Take 1 tablet by mouth every 6 (six) hours as  needed for moderate pain (pain score 4-6).   MELATONIN PO Take 1 tablet by mouth daily as needed (sleep).   Menthol, Topical Analgesic, (BIOFREEZE ROLL-ON EX) Apply topically.   metoprolol  tartrate (LOPRESSOR ) 25 MG tablet TAKE ONE TABLET TWICE DAILY. NEEDS TO BE SEEN FOR REFILLS   potassium chloride  (KLOR-CON  M) 10 MEQ tablet Take 1 tablet (10 mEq total) by mouth 2 (two) times daily.    Objective: BP 133/82   Pulse 70   Ht 5' 7 (1.702 m)   Wt 169 lb 2 oz (76.7 kg)   LMP 09/16/2014 Comment: NEG U PREG 1/312/16  BMI 26.49 kg/m   Physical Exam:  General: Alert and oriented. and No acute distress. Gait: Ambulates with the assistance of a cane  Evaluation of the right knee demonstrates a well-healed surgical incision.  No redness.  No drainage.  Mild swelling is appreciated.  There is a varus deformity.  Partially correctable with valgus stress.  No gross instability.  Toes are warm and well-perfused.  Tenderness in the low back.  Negative straight leg raise.  IMAGING: I personally reviewed images previously obtained in clinic  X-rays of the right knee were previously obtained.  There is varus alignment, with cystic changes and apparent loosening of the tibial tray.   New Medications:  No orders of the defined types were placed in this encounter.     Oneil DELENA Horde, MD  06/18/2024 10:13 AM

## 2024-06-18 NOTE — Addendum Note (Signed)
 Addended by: MARCINE HUSBAND T on: 06/18/2024 11:06 AM   Modules accepted: Orders

## 2024-06-18 NOTE — Patient Instructions (Signed)
 We will place a referral for pain management.  We will place a referral for you to be seen in [place] about your right knee.  If you have not heard from anyone regarding these referrals within the next 1-2 weeks, please contact the clinic.

## 2024-06-19 ENCOUNTER — Other Ambulatory Visit: Payer: Self-pay | Admitting: Orthopedic Surgery

## 2024-06-22 ENCOUNTER — Telehealth: Payer: Self-pay | Admitting: Orthopedic Surgery

## 2024-06-22 NOTE — Telephone Encounter (Signed)
 Dr. Onesimo pt GLENWOOD Hurt from [facility] [phone] lvm stating he needs clarification on this patient meds.

## 2024-06-23 NOTE — Telephone Encounter (Signed)
Called and clarified information

## 2024-06-30 ENCOUNTER — Other Ambulatory Visit (INDEPENDENT_AMBULATORY_CARE_PROVIDER_SITE_OTHER): Payer: Self-pay

## 2024-06-30 ENCOUNTER — Ambulatory Visit: Admitting: Orthopaedic Surgery

## 2024-06-30 DIAGNOSIS — T84012A Broken internal right knee prosthesis, initial encounter: Secondary | ICD-10-CM

## 2024-06-30 DIAGNOSIS — Z96651 Presence of right artificial knee joint: Secondary | ICD-10-CM

## 2024-06-30 MED ORDER — LIDOCAINE HCL 1 % IJ SOLN
2.0000 mL | INTRAMUSCULAR | Status: AC | PRN
  Administered 2024-06-30: 2 mL

## 2024-06-30 MED ORDER — BUPIVACAINE HCL 0.5 % IJ SOLN
2.0000 mL | INTRAMUSCULAR | Status: AC | PRN
  Administered 2024-06-30: 2 mL via INTRA_ARTICULAR

## 2024-06-30 NOTE — Progress Notes (Signed)
 Office Visit Note   Patient: [name]           Date of Birth: [birth year]           MRN: [id number] Visit Date: 06/30/2024              Requested by: Onesimo Oneil LABOR, MD [phone] [address] [place],  [place] [address] PCP: Joesph Annabella HERO, FNP   Assessment & Plan: Visit Diagnoses:  1. Failed total right knee replacement, initial encounter     Plan: [name] is a 53 year old female with varus loosening of the tibial component.  Her inflammatory markers are chronically elevated due to possible underlying rheumatologic condition.  I was able to only aspirate 1.5 cc straw-colored fluid which was not enough for Synovasure.  This was sent for cell count and cultures if possible.  We will contact her with the results.  I did look back at previous documentation with Dr. Brenna and his note mentioned that her insurance program [place] therefore we will have to look into this.  Total face to face encounter time was greater than 45 minutes and over half of this time was spent in counseling and/or coordination of care.   Follow-Up Instructions: No follow-ups on file.   Orders:  Orders Placed This Encounter  Procedures   Large Joint Inj   XR KNEE 3 VIEW RIGHT   Synovial Fluid Analysis, Complete   No orders of the defined types were placed in this encounter.     Procedures: Large Joint Inj: R knee on 06/30/2024 3:19 PM Indications: pain Details: 22 G needle  Arthrogram: No  Medications: 2 mL lidocaine  1 %; 2 mL bupivacaine 0.5 % Consent was given by the patient. Patient was prepped and draped in the usual sterile fashion.       Clinical Data: No additional findings.   Subjective: Chief Complaint  Patient presents with   Right Knee - Pain    HPI [name] is a very pleasant 53 year old female who comes in for chronic right knee pain and deformity.  Underwent right total knee arthroplasty in 2008 by Dr. Sheena who is retired.  She  started having more severe and constant pain  about a year ago.  She currently works as a holiday representative at [employer].  She was prescribed hydrocodone  by Dr. Brenna for chronic pain management.  She walks with a cane.  She denies any constitutional symptoms.  She has had this worsening deformity to her right leg for more than a year and has been evaluated at the [facility] office a few times.    Review of Systems  Constitutional: Negative.   HENT: Negative.    Eyes: Negative.   Respiratory: Negative.    Cardiovascular: Negative.   Endocrine: Negative.   Musculoskeletal: Negative.   Neurological: Negative.   Hematological: Negative.   Psychiatric/Behavioral: Negative.    All other systems reviewed and are negative.    Objective: Vital Signs: LMP 09/16/2014 Comment: NEG U PREG 1/312/16  Physical Exam Vitals and nursing note reviewed.  Constitutional:      Appearance: She is well-developed.  HENT:     Head: Atraumatic.     Nose: Nose normal.  Eyes:     Extraocular Movements: Extraocular movements intact.  Cardiovascular:     Pulses: Normal pulses.  Pulmonary:     Effort: Pulmonary effort is normal.  Abdominal:     Palpations: Abdomen is soft.  Musculoskeletal:  Cervical back: Neck supple.  Skin:    General: Skin is warm.     Capillary Refill: Capillary refill takes less than 2 seconds.  Neurological:     Mental Status: She is alert. Mental status is at baseline.  Psychiatric:        Behavior: Behavior normal.        Thought Content: Thought content normal.        Judgment: Judgment normal.     Ortho Exam Examination of the right knee shows fully healed surgical scar.  Range of motion is approximately 0 to 115 degrees.  There is an obvious varus deformity to the right knee.  Medial sided tenderness.  Collaterals are grossly intact.  Neurovascular intact distally. Specialty Comments:  No specialty comments available.  Imaging: XR KNEE 3 VIEW RIGHT Result Date:  06/30/2024 X-rays of the right knee show a right total knee arthroplasty.  The tibial component is in a significant amount of varus.  This is likely loose.  There is lucency behind the anterior flange of the femoral component.    PMFS History: Patient Active Problem List   Diagnosis Date Noted   Arthralgia of multiple sites 01/23/2024   Swelling of both hands 01/23/2024   Elevated brain natriuretic peptide (BNP) level 01/23/2024   Fatigue 05/15/2022   Gastric ulcer 04/05/2022   Arthritis 11/28/2021   Hypoalbuminemia 11/28/2021   Hypomagnesemia 11/28/2021   Iron  deficiency anemia    NSAID long-term use    Recent unexplained weight loss    Paroxysmal atrial fibrillation with RVR (HCC) 11/27/2021   Midline low back pain without sciatica 11/02/2015   Past Medical History:  Diagnosis Date   Anemia    Arthritis    Chronic back pain    Multiple gastric ulcers    PAF (paroxysmal atrial fibrillation) (HCC)    a. s/p DCCV in 10/2021    Family History  Problem Relation Age of Onset   Diabetes Mother    Stroke Father    Colon cancer Neg Hx    Inflammatory bowel disease Neg Hx     Past Surgical History:  Procedure Laterality Date   BIOPSY  11/29/2021   Procedure: BIOPSY;  Surgeon: Shaaron Lamar HERO, MD;  Location: [facility];  Service: Endoscopy;;   CHOLECYSTECTOMY     ESOPHAGOGASTRODUODENOSCOPY (EGD) WITH PROPOFOL  N/A 11/29/2021   Surgeon: Shaaron Lamar HERO, MD; extensive gastric ulceration extending into the duodenal bulb with secondary pyloric stenosis and retained gastric contents.  Pyloric channel dilated nicely with scope passage only. Gastric mucosa was biopsied and negative for H. pylori, malignancy, dysplasia.   REPLACEMENT TOTAL KNEE BILATERAL     TUBAL LIGATION     Social History   Occupational History   Not on file  Tobacco Use   Smoking status: Former    Current packs/day: 0.00    Average packs/day: 0.5 packs/day for 20.0 years (10.0 ttl pk-yrs)    Types:  Cigarettes    Start date: 11/29/2001    Quit date: 11/29/2021    Years since quitting: 2.5    Passive exposure: Current   Smokeless tobacco: Never  Vaping Use   Vaping status: Former  Substance and Sexual Activity   Alcohol use: No   Drug use: No   Sexual activity: Yes    Birth control/protection: None, Surgical       .

## 2024-07-01 ENCOUNTER — Other Ambulatory Visit: Payer: Self-pay | Admitting: Orthopedic Surgery

## 2024-07-01 LAB — SYNOVIAL FLUID ANALYSIS, COMPLETE
Basophils, %: 0 %
Eosinophils-Synovial: 0 % (ref 0–2)
Lymphocytes-Synovial Fld: 50 % (ref 0–74)
Monocyte/Macrophage: 9 % (ref 0–69)
Neutrophil, Synovial: 41 % — ABNORMAL HIGH (ref 0–24)
Synoviocytes, %: 0 % (ref 0–15)
WBC, Synovial: 1892 {cells}/uL — ABNORMAL HIGH (ref <150)

## 2024-07-02 ENCOUNTER — Ambulatory Visit: Admitting: Family Medicine

## 2024-07-05 ENCOUNTER — Encounter: Payer: Self-pay | Admitting: Radiology

## 2024-07-09 ENCOUNTER — Telehealth: Payer: Self-pay

## 2024-07-09 ENCOUNTER — Telehealth: Payer: Self-pay | Admitting: Orthopaedic Surgery

## 2024-07-09 NOTE — Telephone Encounter (Signed)
 Fluid looked fine.  No signs of infection.

## 2024-07-09 NOTE — Telephone Encounter (Signed)
 Patient called. She would like to know if she needs surgery? Does she need an appointment to come in?

## 2024-07-09 NOTE — Telephone Encounter (Signed)
 Patient called to let Dr. Onesimo know that she still hasn't heard from the pain clinic.  Please call and advise [phone]

## 2024-07-09 NOTE — Telephone Encounter (Signed)
 Patient called and said on her last visit they drew some fluid from her knee and they was testing it for something and never heard back from anybody. CB#[phone]

## 2024-07-09 NOTE — Telephone Encounter (Signed)
 Called. No answer. LMOM with information from Dr.Xu.

## 2024-07-10 NOTE — Telephone Encounter (Signed)
Needs appt to discuss. Thanks.

## 2024-07-12 ENCOUNTER — Other Ambulatory Visit: Payer: Self-pay | Admitting: Orthopedic Surgery

## 2024-07-12 NOTE — Telephone Encounter (Signed)
 Called to schedule. She is only off on Fridays and Dr.Xu is now not in the office on Fridays. She will call back later to schedule appointment.

## 2024-07-13 NOTE — Telephone Encounter (Signed)
 Left VM w/ number for new location and let her know they are reviewing her chart.

## 2024-07-16 ENCOUNTER — Ambulatory Visit: Attending: Cardiology | Admitting: Cardiology

## 2024-07-16 ENCOUNTER — Encounter: Payer: Self-pay | Admitting: Cardiology

## 2024-07-16 VITALS — BP 124/78 | HR 65 | Ht 67.0 in | Wt 170.4 lb

## 2024-07-16 DIAGNOSIS — I471 Supraventricular tachycardia, unspecified: Secondary | ICD-10-CM

## 2024-07-16 DIAGNOSIS — R600 Localized edema: Secondary | ICD-10-CM

## 2024-07-16 DIAGNOSIS — I480 Paroxysmal atrial fibrillation: Secondary | ICD-10-CM

## 2024-07-16 DIAGNOSIS — R002 Palpitations: Secondary | ICD-10-CM | POA: Diagnosis not present

## 2024-07-16 MED ORDER — METOPROLOL TARTRATE 25 MG PO TABS: 25.0000 mg | ORAL_TABLET | Freq: Two times a day (BID) | ORAL | 1 refills | Status: AC

## 2024-07-16 NOTE — Patient Instructions (Signed)
 Medication Instructions:   Lopressor refilled today. Continue all other medications.     Labwork:  none  Testing/Procedures:  none  Follow-Up:  6 months   Any Other Special Instructions Will Be Listed Below (If Applicable).   If you need a refill on your cardiac medications before your next appointment, please call your pharmacy.

## 2024-07-16 NOTE — Progress Notes (Signed)
 Clinical Summary [name] is a 53 y.o.female seen today for follow up of the following medical problems.   1.Chest pain-  - ER visti 01/13/24 with chest pain - trops neg, EKG SR no acute ischemic changes.  02/2024 echo: LVEF 60-65%, no WMAs, grade I dd, normal RV   - left sided. Pressure/sharp pain, 4/10 in severity. Can occur at rest or with activity. +palpitations. Lasts about 5-10 minutes. Occurs about once a week. - has stopped sodas, rare coffee, no tea, no energy drinks, no EtoH - metoprolol  dose ran out about 6 months ago.Felt better when   2.Afib - new diagnosis during 10/2021 admission, hypotensive in ER, was urgently cardioverted  - BP's not much improved after cardioversion, appears hypotension may have been more driven by hypovolemia and severe anemia, GI bleed - CHADS2Vasc score of 1, was not stated on anticoag long term, did receive 1 month after DCCV.   02/2024 monitor: 14 day monitor. SR, 7 runs SVT longest 14 beats. Rare ventricular ectopy. No afib    3. Fe deficient anemia Her Hgb declined to 5.5 during her recent admission and she required transfusions and EGD showed extensive gastric ulceration felt to be secondary to NSAID use.   4. LE edema - 02/2024 echo: LVEF 60-65%, no WMAs, grade I dd, normal RV  - bilateral swelling, better with fluid pills. Bilateral knee replacements Past Medical History:  Diagnosis Date   Anemia    Arthritis    Chronic back pain    Multiple gastric ulcers    PAF (paroxysmal atrial fibrillation) (HCC)    a. s/p DCCV in 10/2021     Allergies  Allergen Reactions   Penicillins Nausea And Vomiting     Current Outpatient Medications  Medication Sig Dispense Refill   albuterol  (VENTOLIN  HFA) 108 (90 Base) MCG/ACT inhaler Inhale 2 puffs into the lungs every 6 (six) hours as needed for wheezing or shortness of breath.     ferrous sulfate 324 MG TBEC Take 324 mg by mouth.     furosemide  (LASIX ) 20 MG tablet Take 1 tablet  (20 mg total) by mouth daily. 90 tablet 3   HYDROcodone -acetaminophen  (NORCO) 7.5-325 MG tablet Take 1 tablet by mouth every 6 (six) hours as needed for moderate pain (pain score 4-6). 28 tablet 0   MELATONIN PO Take 1 tablet by mouth daily as needed (sleep).     Menthol, Topical Analgesic, (BIOFREEZE ROLL-ON EX) Apply topically.     metoprolol  tartrate (LOPRESSOR ) 25 MG tablet TAKE ONE TABLET TWICE DAILY. NEEDS TO BE SEEN FOR REFILLS 15 tablet 0   potassium chloride  (KLOR-CON  M) 10 MEQ tablet Take 1 tablet (10 mEq total) by mouth 2 (two) times daily. 180 tablet 3   No current facility-administered medications for this visit.     Past Surgical History:  Procedure Laterality Date   BIOPSY  11/29/2021   Procedure: BIOPSY;  Surgeon: Shaaron Lamar HERO, MD;  Location: [facility];  Service: Endoscopy;;   CHOLECYSTECTOMY     ESOPHAGOGASTRODUODENOSCOPY (EGD) WITH PROPOFOL  N/A 11/29/2021   Surgeon: Shaaron Lamar HERO, MD; extensive gastric ulceration extending into the duodenal bulb with secondary pyloric stenosis and retained gastric contents.  Pyloric channel dilated nicely with scope passage only. Gastric mucosa was biopsied and negative for H. pylori, malignancy, dysplasia.   REPLACEMENT TOTAL KNEE BILATERAL     TUBAL LIGATION       Allergies  Allergen Reactions   Penicillins Nausea And Vomiting  Family History  Problem Relation Age of Onset   Diabetes Mother    Stroke Father    Colon cancer Neg Hx    Inflammatory bowel disease Neg Hx      Social History Ms. Kagel reports that she quit smoking about 2 years ago. Her smoking use included cigarettes. She started smoking about 22 years ago. She has a 10 pack-year smoking history. She has been exposed to tobacco smoke. She has never used smokeless tobacco. Ms. Haughey reports no history of alcohol use.     Physical Examination Today's Vitals   07/16/24 0856 07/16/24 0920  BP: (!) 142/84 124/78  Pulse: 65   SpO2: 96%    Weight: 170 lb 6.4 oz (77.3 kg)   Height: 5' 7 (1.702 m)    Body mass index is 26.69 kg/m.  Gen: resting comfortably, no acute distress HEENT: no scleral icterus, pupils equal round and reactive, no palptable cervical adenopathy,  CV: RRR, no m/rg, no jvd Resp: Clear to auscultation bilaterally GI: abdomen is soft, non-tender, non-distended, normal bowel sounds, no hepatosplenomegaly MSK: extremities are warm, trace bilateral edema Skin: warm, no rash Neuro:  no focal deficits Psych: appropriate affect     Assessment and Plan  1.Chest pain/palpitations/PSVT - symptoms most consistent with symptomatic arrhythmia - recent monitor showed episodes of SVT - she ran out of her beta blocker about 6 months ago, says felt better when was on - restart lopressor  25mg  bid.  - symptoms not consistent with ischemic etiology, plan plans for ischemic testing at this time  2. Afib - prior history of PAF, low CHADS2Vasc score of 1 has not been committed to anticoag - recent monitor showed SVT, no afib - continue to monitor  3. LE edema - fairly mild echo, just grade I diastolic dysfunction. Normal LA pressure and LA size.  - perhaps diastolic dysfunction playing mild role in swelling, likely more related to prior bilateral knee replacements and likely some venous insufficiency - controlled with low dose lasix , room to titrate if needed   F/u 6 months      Dorn PHEBE Ross, M.D.,

## 2024-07-20 ENCOUNTER — Other Ambulatory Visit: Payer: Self-pay | Admitting: Orthopedic Surgery

## 2024-07-23 ENCOUNTER — Ambulatory Visit: Admitting: Cardiology

## 2024-07-28 ENCOUNTER — Encounter: Payer: Self-pay | Admitting: Physical Medicine & Rehabilitation

## 2024-07-30 ENCOUNTER — Other Ambulatory Visit: Payer: Self-pay | Admitting: Orthopedic Surgery

## 2024-08-03 ENCOUNTER — Ambulatory Visit: Admitting: Orthopaedic Surgery

## 2024-08-03 DIAGNOSIS — T84012A Broken internal right knee prosthesis, initial encounter: Secondary | ICD-10-CM

## 2024-08-03 NOTE — Addendum Note (Signed)
 Addended by: Britton Bera on: 08/03/2024 01:30 PM   Modules accepted: Orders

## 2024-08-03 NOTE — Progress Notes (Signed)
   Office Visit Note   Patient: [name]           Date of Birth: 23-Oct-1970           MRN: [id number] Visit Date: 08/03/2024              Requested by: Joesph Annabella HERO, FNP [address] [place],  [place] [address] PCP: Joesph Annabella HERO, FNP   Assessment & Plan: Visit Diagnoses:  1. Failed total right knee replacement, initial encounter     Plan: History of Present Illness [name] is a 53 year old female who presents for follow up on right knee TKA loosening.  She has a prior knee replacement with knee radiographs obtained about six weeks ago. She uses a pull-up brace for knee stability and mobility.  Physical Exam MUSCULOSKELETAL: Significant varus deformity.  Range of motion is approximately 0 to 120 degrees.  Assessment and Plan Failed right total knee arthroplasty Aseptic loosening of tibial component.  - Referred to Dr. Onesimo for knee revision surgery.  Follow-Up Instructions: No follow-ups on file.   Orders:  No orders of the defined types were placed in this encounter.

## 2024-08-06 ENCOUNTER — Other Ambulatory Visit: Payer: Self-pay | Admitting: Orthopedic Surgery

## 2024-08-16 ENCOUNTER — Other Ambulatory Visit: Payer: Self-pay | Admitting: Orthopedic Surgery

## 2024-08-21 ENCOUNTER — Other Ambulatory Visit: Payer: Self-pay | Admitting: Orthopedic Surgery

## 2024-08-28 ENCOUNTER — Other Ambulatory Visit: Payer: Self-pay | Admitting: Orthopedic Surgery

## 2024-09-01 NOTE — Progress Notes (Signed)
 Assessment and Plan:   Assessment & Plan Upper respiratory tract infection, unspecified type Cough, body aches, and congestion for six days. No fever.  Treated for bronchitis with steroids and antibiotics due to history of pneumonia and persistent cough. COVID, flu, and RSV swabs pending. - Administered nebulizer treatment with albuterol  and Atrovent. - Prescribed steroid taper for inflammation. - Prescribed albuterol  inhaler every 4-6 hours as needed. - Prescribed Z-Pak (azithromycin ) due to history of pneumonia. - Advised increased fluid intake, honey, warm tea, and vitamin C. - Recommended Vicks vapor rub and steamy showers for congestion relief. - Provided work excuse for three days. Advised patient to seek additional care if symptoms are not improving in 3 to 5 days or if patient experiences new or other concerning symptoms. Orders:   azithromycin  (ZITHROMAX ) 250 MG tablet; Take 2 tablets (500 mg total) by mouth daily for 1 day, THEN 1 tablet (250 mg total) daily for 4 days.  Bronchitis  Orders:   albuterol  2.5 mg /3 mL (0.083 %) nebulizer solution 2.5 mg   ipratropium (ATROVENT) 0.02 % nebulizer solution 500 mcg   predniSONE  (DELTASONE ) 10 MG tablet; Take 6 tablets (60 mg total) by mouth daily for 1 day, THEN 5 tablets (50 mg total) daily for 1 day, THEN 4 tablets (40 mg total) daily for 1 day, THEN 3 tablets (30 mg total) daily for 1 day, THEN 2 tablets (20 mg total) daily for 1 day, THEN 1 tablet (10 mg total) daily for 1 day.   albuterol  HFA 90 mcg/actuation inhaler; Inhale 2 puffs every four (4) hours as needed.  Cough, unspecified type  Orders:   POCT SARS/FLU/RSV - RN OBTAIN  Influenza A Symptoms started 6 days ago, out of window for tamiflu to be effective.        Patient/patient's family verbalizes understanding and is in agreement with plan at this time.  The following portions of the patient's history were reviewed and updated as appropriate: allergies,  current medications, past family history, past medical history, past social history, past surgical history and problem list.   Subjective:   Patient ID: [name] is a 53 y.o. female Chief Complaint  Patient presents with   Cough    Patient reports body aches, congestion, and cough x 6 days      History of Present Illness [name] is a 53 year old female who presents with cough, body aches, and congestion for six days. She is accompanied by her husband.She reports a severe cough, body aches, and congestion for six days since Christmas, with chest soreness from coughing and a burning, congested sensation in her chest. She has had nosebleeds when blowing her nose. She denies sputum production.She feels nauseated and has eaten very little but denies vomiting or diarrhea. She has body aches and chills and feels unusually cold but has not taken her temperature.She did not get a flu shot this year. She had pneumonia and COVID last winter and is allergic to penicillin.She works at [employer] and has tried to continue working but has difficulty due to shortness of breath and trouble breathing through the congestion.    Cough This is a new problem. The current episode started in the past 7 days. The problem has been unchanged. The cough is Productive of sputum. Associated symptoms include chills, headaches, myalgias, nasal congestion, rhinorrhea, a sore throat, shortness of breath and wheezing. Pertinent negatives include no chest pain, fever or rash. She has tried OTC cough suppressant for the symptoms. The  treatment provided mild relief. Her past medical history is significant for pneumonia.    ROS: Negative unless otherwise noted in HPI.  Objective:   Vital Signs:  BP 135/61 (BP Site: L Arm, BP Position: Sitting)   Pulse 74   Temp 36.8 C (98.3 F) (Oral)   Resp 20   Wt 76.1 kg (167 lb 11.2 oz)   SpO2 96%   BMI 26.27 kg/m   Body mass index is 26.27 kg/m. No LMP recorded.  Patient is postmenopausal.  No results found for this visit on 09/01/24.   Physical Exam Vitals and nursing note reviewed.  Constitutional:      General: She is not in acute distress.    Appearance: Normal appearance. She is normal weight. She is not ill-appearing or toxic-appearing.  HENT:     Head: Normocephalic.     Right Ear: Tympanic membrane, ear canal and external ear normal.     Left Ear: Tympanic membrane, ear canal and external ear normal.     Nose: Congestion and rhinorrhea present.     Mouth/Throat:     Mouth: Mucous membranes are moist.     Pharynx: Oropharynx is clear.  Eyes:     Extraocular Movements: Extraocular movements intact.     Conjunctiva/sclera: Conjunctivae normal.     Pupils: Pupils are equal, round, and reactive to light.  Cardiovascular:     Rate and Rhythm: Normal rate and regular rhythm.     Heart sounds: Normal heart sounds.  Pulmonary:     Effort: Pulmonary effort is normal. No respiratory distress.     Breath sounds: Wheezing present.     Comments: DuoNeb completed in clinic with resolution of wheezes. Abdominal:     General: Abdomen is flat.     Palpations: Abdomen is soft.  Musculoskeletal:        General: Normal range of motion.     Cervical back: Normal range of motion.     Right lower leg: No edema.     Left lower leg: No edema.  Skin:    General: Skin is warm and dry.     Findings: No rash.  Neurological:     General: No focal deficit present.     Mental Status: She is alert and oriented to person, place, and time. Mental status is at baseline.  Psychiatric:        Mood and Affect: Mood normal.        Behavior: Behavior normal.        Thought Content: Thought content normal.        Judgment: Judgment normal.     Follow-up as Needed  and Follow-up with PCP  The use of abridge was used to help in the completion of this note. Patient was educated and verbalized consent of its use.   Disposition Upon Discharge:  1.  Routine  symptom specific, illness specific and/or disease specific instructions were discussed with the patient and/or caregiver at length.  2.  The differential diagnosis for the patient's specific symptoms were reviewed and discussed with the patient/caregiver at length; the patient/caregiver expressed understanding of all discussed and their relevant questions were all satisfactorily answered.  3.  Return to care should the presenting symptoms recur, persist, or worsen in any way; or in the alternative, if new symptoms or complaints develop.  4.  The patient and any family present were given verbal and/or written discharge instructions as clinically indicated and appropriate.  5.  Continue OTC medications as needed  and tolerated for control of the currently reported symptoms, if no conflict with the currently prescribed medications.

## 2024-09-10 NOTE — Progress Notes (Unsigned)
 "  Office Visit Note  Patient: [name]             Date of Birth: 1970/11/13           MRN: [id number]             PCP: Joesph Annabella HERO, FNP Referring: Joesph Annabella HERO, FNP Visit Date: 09/24/2024 Occupation: Data Unavailable  Subjective:  No chief complaint on file.   History of Present Illness: [name] is a 54 y.o. female ***     Activities of Daily Living:  Patient reports morning stiffness for *** {minute/hour:19697}.   Patient {ACTIONS;DENIES/REPORTS:21021675::Denies} nocturnal pain.  Difficulty dressing/grooming: {ACTIONS;DENIES/REPORTS:21021675::Denies} Difficulty climbing stairs: {ACTIONS;DENIES/REPORTS:21021675::Denies} Difficulty getting out of chair: {ACTIONS;DENIES/REPORTS:21021675::Denies} Difficulty using hands for taps, buttons, cutlery, and/or writing: {ACTIONS;DENIES/REPORTS:21021675::Denies}  No Rheumatology ROS completed.   PMFS History:  Patient Active Problem List   Diagnosis Date Noted   Arthralgia of multiple sites 01/23/2024   Swelling of both hands 01/23/2024   Elevated brain natriuretic peptide (BNP) level 01/23/2024   Fatigue 05/15/2022   Gastric ulcer 04/05/2022   Arthritis 11/28/2021   Hypoalbuminemia 11/28/2021   Hypomagnesemia 11/28/2021   Iron  deficiency anemia    NSAID long-term use    Recent unexplained weight loss    Paroxysmal atrial fibrillation with RVR (HCC) 11/27/2021   Midline low back pain without sciatica 11/02/2015    Past Medical History:  Diagnosis Date   Anemia    Arthritis    Chronic back pain    Multiple gastric ulcers    PAF (paroxysmal atrial fibrillation) (HCC)    a. s/p DCCV in 10/2021    Family History  Problem Relation Age of Onset   Diabetes Mother    Stroke Father    Colon cancer Neg Hx    Inflammatory bowel disease Neg Hx    Past Surgical History:  Procedure Laterality Date   BIOPSY  11/29/2021   Procedure: BIOPSY;  Surgeon: Shaaron Lamar HERO, MD;  [facility];  Service: Endoscopy;;   CHOLECYSTECTOMY     ESOPHAGOGASTRODUODENOSCOPY (EGD) WITH PROPOFOL  N/A 11/29/2021   Surgeon: Shaaron Lamar HERO, MD; extensive gastric ulceration extending into the duodenal bulb with secondary pyloric stenosis and retained gastric contents.  Pyloric channel dilated nicely with scope passage only. Gastric mucosa was biopsied and negative for H. pylori, malignancy, dysplasia.   REPLACEMENT TOTAL KNEE BILATERAL     TUBAL LIGATION     Social History[1] Social History   Social History Narrative   Not on file      There is no immunization history on file for this patient.   Objective: Vital Signs: LMP 09/16/2014 Comment: NEG U PREG 1/312/16   Physical Exam   Musculoskeletal Exam: ***  CDAI Exam: CDAI Score: -- Patient Global: --; Provider Global: -- Swollen: --; Tender: -- Joint Exam 09/24/2024   No joint exam has been documented for this visit   There is currently no information documented on the homunculus. Go to the Rheumatology activity and complete the homunculus joint exam.  Investigation: No additional findings.  Imaging: No results found.  Recent Labs: Lab Results  Component Value Date   WBC 6.3 01/23/2024   HGB 10.3 (L) 01/23/2024   PLT 445 01/23/2024   NA 140 02/13/2024   K 4.5 02/13/2024   CL 110 (H) 02/13/2024   CO2 15 (L) 02/13/2024   GLUCOSE 85 02/13/2024   BUN 16 02/13/2024   CREATININE 0.93 02/13/2024   BILITOT 0.4 01/13/2024  ALKPHOS 81 01/13/2024   AST 21 01/13/2024   ALT 11 01/13/2024   PROT 6.3 (L) 01/13/2024   ALBUMIN 2.5 (L) 01/13/2024   CALCIUM 10.1 02/13/2024   GFRAA >90 10/02/2014   June 30, 2024 synovial fluid WBC 1892, crystals negative Jan 23, 2024 RF negative, anti-CCP negative, 14 333 eta negative, ENA panel (double-stranded DNA, RNP, Smith, SCL 70, SSA, SSB, chromatin, Jo 1, centromere) negative, uric acid 5.0, sed rate 98, CRP 85, uric acid 5.0, TSH normal, iron  saturation low Speciality  Comments: No specialty comments available.  Procedures:  No procedures performed Allergies: Penicillins   Assessment / Plan:     Visit Diagnoses: Elevated sed rate  Arthralgia of multiple sites  Swelling of both hands  H/O total knee replacement, right - Possible loosening noted on the x-rays October 2025.  Evaluated by Dr. Jerri.  Chronic midline low back pain without sciatica  Other fatigue  Paroxysmal atrial fibrillation with RVR (HCC)  History of gastric ulcer  Hypoalbuminemia  Hypomagnesemia  Other iron  deficiency anemia  Orders: No orders of the defined types were placed in this encounter.  No orders of the defined types were placed in this encounter.   Face-to-face time spent with patient was *** minutes. Greater than 50% of time was spent in counseling and coordination of care.  Follow-Up Instructions: No follow-ups on file.   Maya Nash, MD  Note - This record has been created using Animal nutritionist.  Chart creation errors have been sought, but may not always  have been located. Such creation errors do not reflect on  the standard of medical care.    [1]  Social History Tobacco Use   Smoking status: Former    Current packs/day: 0.00    Average packs/day: 0.5 packs/day for 20.0 years (10.0 ttl pk-yrs)    Types: Cigarettes    Start date: 11/29/2001    Quit date: 11/29/2021    Years since quitting: 2.7    Passive exposure: Current   Smokeless tobacco: Never  Vaping Use   Vaping status: Former  Substance Use Topics   Alcohol use: No   Drug use: No   "

## 2024-09-17 ENCOUNTER — Encounter: Attending: Physical Medicine & Rehabilitation | Admitting: Physical Medicine & Rehabilitation

## 2024-09-17 ENCOUNTER — Encounter: Payer: Self-pay | Admitting: Physical Medicine & Rehabilitation

## 2024-09-17 ENCOUNTER — Telehealth (HOSPITAL_BASED_OUTPATIENT_CLINIC_OR_DEPARTMENT_OTHER): Payer: Self-pay | Admitting: *Deleted

## 2024-09-17 VITALS — BP 108/74 | HR 62 | Ht 67.0 in | Wt 176.4 lb

## 2024-09-17 DIAGNOSIS — Z5181 Encounter for therapeutic drug level monitoring: Secondary | ICD-10-CM | POA: Diagnosis not present

## 2024-09-17 DIAGNOSIS — Z79891 Long term (current) use of opiate analgesic: Secondary | ICD-10-CM | POA: Diagnosis not present

## 2024-09-17 DIAGNOSIS — G894 Chronic pain syndrome: Secondary | ICD-10-CM | POA: Insufficient documentation

## 2024-09-17 NOTE — Patient Instructions (Signed)
" °  VISIT SUMMARY: Today, we discussed your chronic low back pain, right knee osteoarthritis, and pain management plan. We also reviewed your upcoming right total knee replacement surgery and provided guidance on postoperative care.  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 -We discussed your condition using a model to help you understand scoliosis, spondylosis, and back pain. -Inform your physical therapists about your back pain during post-knee replacement rehabilitation. -We will consider future interventional pain management options, such as facet joint injections and radiofrequency ablation, after your knee surgery. -Focus on your knee surgery first, and we will revisit back pain management after your recovery.  RIGHT KNEE OSTEOARTHRITIS, PLANNED FOR TOTAL KNEE REPLACEMENT: You have severe right knee osteoarthritis causing significant pain and difficulty with movement. You are scheduled for a total knee replacement. -Proceed with your total knee arthroplasty as planned. -Notify our clinic once your surgical date is set so we can coordinate with your orthopedic surgeon. -Your surgical team will manage your postoperative pain initially, and then you will transition back to our clinic for ongoing pain management.  CHRONIC OPIOID THERAPY FOR PAIN MANAGEMENT: You are on chronic opioid therapy (hydrocodone  7.5 mg, 2 tablets daily) for your back and knee pain, with no reported side effects or misuse. -We performed a urine drug screen before renewing your opioid prescription. -Your opioid prescriptions will be sent electronically, and you will have monthly in-person monitoring. -After your surgery, pain medication management will transition to your orthopedic surgeon initially, and then back to our clinic. -Routine medication refills and monitoring will be handled by  our nurse practitioner, while interventional procedures will be managed by the physician.                      Contains text generated by Abridge.                                 Contains text generated by Abridge.   "

## 2024-09-17 NOTE — Telephone Encounter (Signed)
" ° °  Name: [name]  DOB: 10/01/1970  MRN: [id number]  Primary Cardiologist: Alvan Carrier, MD   Preoperative team, please contact this patient and set up a phone call appointment for further preoperative risk assessment. Please obtain consent and complete medication review. Thank you for your help.  I confirm that guidance regarding antiplatelet and oral anticoagulation therapy has been completed and, if necessary, noted below.N/A  I also confirmed the patient resides in the state of [place] . As per [facility] Medical Board telemedicine laws, the patient must reside in the state in which the provider is licensed.   Brentney Goldbach E Marla Pouliot, PA-C 09/17/2024, 4:57 PM [facility]    "

## 2024-09-17 NOTE — Telephone Encounter (Signed)
"  ° °  Pre-operative Risk Assessment    Patient Name: [name]  DOB: Jan 02, 1971 MRN: [id number]   Date of last office visit: 07/16/24 DR, ALVAN Date of next office visit: NONE   Request for Surgical Clearance    Procedure:  REVISION RIGHT TOTAL KNEE ARTHROPLASTY  Date of Surgery:  Clearance TBD                                Surgeon:  DR. DANIEL MARCHWIANY Surgeon's Group or Practice Name:  [facility] Phone number:  [phone] [facility] Fax number:  [phone]   Type of Clearance Requested:   - Medical    Type of Anesthesia:  Spinal   Additional requests/questions:    Bonney Niels Jest   09/17/2024, 4:36 PM   "

## 2024-09-17 NOTE — Telephone Encounter (Signed)
 Patient is scheduled for pre-op clearance on 09/28/24 with Glendia Ferrier, PA-C. No questions at the given time.     Patient Consent for Virtual Visit        [name] has provided verbal consent on 09/17/2024 for a virtual visit (video or telephone).   CONSENT FOR VIRTUAL VISIT FOR:  [name]  By participating in this virtual visit I agree to the following:  I hereby voluntarily request, consent and authorize [facility] and its employed or contracted physicians, physician assistants, nurse practitioners or other licensed health care professionals (the Practitioner), to provide me with telemedicine health care services (the Services) as deemed necessary by the treating Practitioner. I acknowledge and consent to receive the Services by the Practitioner via telemedicine. I understand that the telemedicine visit will involve communicating with the Practitioner through live audiovisual communication technology and the disclosure of certain medical information by electronic transmission. I acknowledge that I have been given the opportunity to request an in-person assessment or other available alternative prior to the telemedicine visit and am voluntarily participating in the telemedicine visit.  I understand that I have the right to withhold or withdraw my consent to the use of telemedicine in the course of my care at any time, without affecting my right to future care or treatment, and that the Practitioner or I may terminate the telemedicine visit at any time. I understand that I have the right to inspect all information obtained and/or recorded in the course of the telemedicine visit and may receive copies of available information for a reasonable fee.  I understand that some of the potential risks of receiving the Services via telemedicine include:  Delay or interruption in medical evaluation due to technological equipment failure or disruption; Information transmitted may  not be sufficient (e.g. poor resolution of images) to allow for appropriate medical decision making by the Practitioner; and/or  In rare instances, security protocols could fail, causing a breach of personal health information.  Furthermore, I acknowledge that it is my responsibility to provide information about my medical history, conditions and care that is complete and accurate to the best of my ability. I acknowledge that Practitioner's advice, recommendations, and/or decision may be based on factors not within their control, such as incomplete or inaccurate data provided by me or distortions of diagnostic images or specimens that may result from electronic transmissions. I understand that the practice of medicine is not an exact science and that Practitioner makes no warranties or guarantees regarding treatment outcomes. I acknowledge that a copy of this consent can be made available to me via my patient portal [facility] MyChart), or I can request a printed copy by calling the office of [facility].    I understand that my insurance will be billed for this visit.   I have read or had this consent read to me. I understand the contents of this consent, which adequately explains the benefits and risks of the Services being provided via telemedicine.  I have been provided ample opportunity to ask questions regarding this consent and the Services and have had my questions answered to my satisfaction. I give my informed consent for the services to be provided through the use of telemedicine in my medical care

## 2024-09-17 NOTE — Progress Notes (Signed)
 "  Subjective:    Patient ID: [name], female    DOB: 1971-06-26, 54 y.o.   MRN: [id number]  HPI Discussed the use of AI scribe software for clinical note transcription with the patient, who gave verbal consent to proceed.  History of Present Illness [name] is a 54 year old female with lumbar spondylosis, scoliosis, and right knee osteoarthritis who presents for pain management and preoperative evaluation.  She has persistent low back pain localized to the lower lumbar region, predominantly at L4-L5-S1, with bilateral discomfort. The pain intermittently radiates to the buttocks but does not extend to the feet. She denies lower extremity numbness, paresthesia, or sensory deficits. She is unable to distinguish whether her knee pain exacerbates her back pain or vice versa, describing the symptoms as interrelated.  She manages her pain with hydrocodone  7.5 mg, two tablets daily, without experiencing adverse effects such as constipation, nausea, or falls. She has not participated in physical therapy or received interventional procedures for her back pain, and no other non-pharmacologic modalities have been utilized.  Recent lumbar spine radiographs showed mild rightward scoliosis and degenerative changes. She recalls a prior provider noting diffuse arthritic changes throughout her spine. She recalls a prior provider noting diffuse arthritic changes throughout her spine.  She is awaiting right total knee arthroplasty and is undergoing surgical clearance. She reports right knee pain that limits her ability to stand and ambulate. She utilizes a cane and has a walker at home for mobility. She also reports hip pain.  She does not use alcohol or illicit drugs. She relies on her adult son for transportation and does not drive.   Pain Inventory Average Pain 7 Pain Right Now 7 My pain is sharp and aching  In the last 24 hours, has pain interfered with the following? General activity  0 Relation with others 0 Enjoyment of life 0 What TIME of day is your pain at its worst? night Sleep (in general) NA  Pain is worse with: walking and bending Pain improves with: rest and medication Relief from Meds: 5  use a cane use a walker ability to climb steps?  yes do you drive?  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  yes has a collapsed right knee replacement that she is awaiting surgery with [facility] ... Not scheduled yet  Any changes since last visit?  yes Dr Brenna retired    Family History  Problem Relation Age of Onset   Diabetes Mother    Stroke Father    Colon cancer Neg Hx    Inflammatory bowel disease Neg Hx    Social History   Socioeconomic History   Marital status: Legally Separated    Spouse name: Not on file   Number of children: Not on file   Years of education: Not on file   Highest education level: Not on file  Occupational History   Not on file  Tobacco Use   Smoking status: Former    Current packs/day: 0.00    Average packs/day: 0.5 packs/day for 20.0 years (10.0 ttl pk-yrs)    Types: Cigarettes    Start date: 11/29/2001    Quit date: 11/29/2021    Years since quitting: 2.8    Passive exposure: Current   Smokeless tobacco: Never  Vaping Use   Vaping status: Former  Substance and Sexual Activity   Alcohol use: No   Drug  use: No   Sexual activity: Yes    Birth control/protection: None, Surgical  Other Topics Concern   Not on file  Social History Narrative   Not on file   Social Drivers of Health   Tobacco Use: Medium Risk (09/17/2024)   Patient History    Smoking Tobacco Use: Former    Smokeless Tobacco Use: Never    Passive Exposure: Current  Physicist, Medical Strain: Not on file  Food Insecurity: Not on file  Transportation Needs: Not on file  Physical Activity: Not on file  Stress: Not on file  Social Connections: Not on file  Depression  (PHQ2-9): Low Risk (09/17/2024)   Depression (PHQ2-9)    PHQ-2 Score: 0  Alcohol Screen: Not on file  Housing: Not on file  Utilities: Not on file  Health Literacy: Not on file   Past Surgical History:  Procedure Laterality Date   BIOPSY  11/29/2021   Procedure: BIOPSY;  Surgeon: Shaaron Lamar HERO, MD;  [facility];  Service: Endoscopy;;   CHOLECYSTECTOMY     ESOPHAGOGASTRODUODENOSCOPY (EGD) WITH PROPOFOL  N/A 11/29/2021   Surgeon: Shaaron Lamar HERO, MD; extensive gastric ulceration extending into the duodenal bulb with secondary pyloric stenosis and retained gastric contents.  Pyloric channel dilated nicely with scope passage only. Gastric mucosa was biopsied and negative for H. pylori, malignancy, dysplasia.   REPLACEMENT TOTAL KNEE BILATERAL     TUBAL LIGATION     Past Medical History:  Diagnosis Date   Anemia    Arthritis    Chronic back pain    Multiple gastric ulcers    PAF (paroxysmal atrial fibrillation) (HCC)    a. s/p DCCV in 10/2021   BP 108/74   Pulse 62   Ht 5' 7 (1.702 m)   Wt 176 lb 6.4 oz (80 kg)   LMP 09/16/2014 Comment: NEG U PREG 1/312/16  SpO2 92%   BMI 27.63 kg/m   Opioid Risk Score:  0 Fall Risk Score:  `1  Depression screen [facility] 2/9     09/17/2024    9:44 AM 02/13/2024    9:41 AM 01/23/2024   10:29 AM 03/14/2023    9:27 AM 12/13/2022    9:09 AM 11/08/2022    9:44 AM 05/27/2022    8:06 AM  Depression screen PHQ 2/9  Decreased Interest 0 0 0 0 0 0 0  Down, Depressed, Hopeless 0 0 0 0 0 0 0  PHQ - 2 Score 0 0 0 0 0 0 0  Altered sleeping 0 0 0 1 3 3    Tired, decreased energy 0 0 0 1 3 3    Change in appetite 0 0 0 0 0 1   Feeling bad or failure about yourself  0 0 0 0 0 0   Trouble concentrating 0 0 0 0 0 0   Moving slowly or fidgety/restless 0 0 0 0 0 0   Suicidal thoughts 0 0 0 0 0 0   PHQ-9 Score 0 0  0  2  6  7     Difficult doing work/chores  Not difficult at all Not difficult at all Not difficult at all Not difficult at all Somewhat  difficult      Data saved with a previous flowsheet row definition     Review of Systems  Musculoskeletal:  Positive for back pain and gait problem.  All other systems reviewed and are negative.      Objective:   Physical Exam  General no acute  distress Mood affect appropriate EXTR without edema UE range of motion is normal Lumbar spine: Scoliosis convex to right side Reduced lumbar range of motion balance partially limited by knee pain severe in the morning. Tenderness palpation bilateral lumbar paraspinal L4, L5, S1.  No tenderness over PSIS Negative straight leg raising bilaterally Sensation normal to light touch in extremities   Sacral thrust  : Positive right Lateral compression: Negative FABER's: Negative Distraction (supine): Negative Thigh thrust test: Negative  Right knee marked valgus deformity.  Pain with range of motion. Ambulates with a cane with right knee valgus deformity, pain with weightbearing       Assessment & Plan:  Assessment and Plan Assessment & Plan Low back pain with lumbar spondylosis and lumbar scoliosis Chronic low back pain at L4-L5-S1 due to scoliosis and facet joint osteoarthritis. No radiculopathy or sacroiliac joint involvement. - Educated on scoliosis, spondylosis, and back pain using a model. - Advised to inform physical therapists about back pain during post-knee replacement rehabilitation. - Discussed future interventional pain management options post-knee surgery, including facet joint injections and radiofrequency ablation. - Advised to prioritize knee surgery and revisit back pain management post-recovery.  We discussed that after adequate recovery from knee surgery she may be a good candidate for lumbar medial branch blocks to evaluate potential usefulness of lumbar radiofrequency neurotomy bilateral L4-5 L5-S1 facet joint complex  Right knee osteoarthritis, planned for total knee replacement Severe right knee osteoarthritis  causing significant pain and functional limitation. Scheduled for total knee arthroplasty pending surgical clearance. - Advised to proceed with total knee arthroplasty. - Instructed to notify clinic once surgical date is set for coordination with orthopedic surgeon. - Discussed postoperative pain management by surgical team initially, transitioning back to this clinic post-acute period.  Chronic opioid therapy for pain management Chronic opioid therapy (hydrocodone  7.5 mg, 2 tablets daily) for lumbar spondylosis, scoliosis, and knee osteoarthritis. No side effects or misuse reported. - Performed urine drug screen prior to opioid prescription renewal. - Opioid prescriptions to be sent electronically with monthly in-person monitoring. - Pain medication management to transition to orthopedic surgeon postoperatively, then return to this clinic. - Routine medication refills and monitoring by nurse practitioner, interventional procedures by physician. Low risk opioid risk tool score, low MME, 15 mg/day   "

## 2024-09-20 NOTE — Telephone Encounter (Signed)
 I s/w the pt and she has rescheduled her time for tele preop appt 09/28/24 to 2 pm. Pt has a DDS appt in the AM. Pt has kept 09/28/24 tele appt and changed the time to 2 pm for the call for preop clearance.

## 2024-09-20 NOTE — Telephone Encounter (Signed)
 Patient wants to keep the same date but reschedule the time of the appointment. Please advise.

## 2024-09-22 LAB — DRUG TOX MONITOR 1 W/CONF, ORAL FLD
Alprazolam: NEGATIVE ng/mL
Amphetamines: NEGATIVE ng/mL
Barbiturates: NEGATIVE ng/mL
Benzodiazepines: NEGATIVE ng/mL
Buprenorphine: NEGATIVE ng/mL
Chlordiazepoxide: NEGATIVE ng/mL
Clonazepam: NEGATIVE ng/mL
Cocaine: NEGATIVE ng/mL
Codeine: NEGATIVE ng/mL
Cotinine: 91.9 ng/mL — ABNORMAL HIGH
Diazepam: NEGATIVE ng/mL
Dihydrocodeine: 2.6 ng/mL — ABNORMAL HIGH
Fentanyl: NEGATIVE ng/mL
Flunitrazepam: NEGATIVE ng/mL
Flurazepam: NEGATIVE ng/mL
Heroin Metabolite: NEGATIVE ng/mL
Hydrocodone: 55.5 ng/mL — ABNORMAL HIGH
Hydromorphone: NEGATIVE ng/mL
Lorazepam: NEGATIVE ng/mL
MARIJUANA: NEGATIVE ng/mL
MDMA: NEGATIVE ng/mL
Meprobamate: NEGATIVE ng/mL
Methadone: NEGATIVE ng/mL
Midazolam: NEGATIVE ng/mL
Morphine: NEGATIVE ng/mL
Nicotine Metabolite: POSITIVE ng/mL — AB
Nordiazepam: NEGATIVE ng/mL
Norhydrocodone: 3.9 ng/mL — ABNORMAL HIGH
Noroxycodone: NEGATIVE ng/mL
Opiates: POSITIVE ng/mL — AB
Oxazepam: NEGATIVE ng/mL
Oxycodone: NEGATIVE ng/mL
Oxymorphone: NEGATIVE ng/mL
Phencyclidine: NEGATIVE ng/mL
Tapentadol: NEGATIVE ng/mL
Temazepam: NEGATIVE ng/mL
Tramadol: NEGATIVE ng/mL
Triazolam: NEGATIVE ng/mL
Zolpidem: NEGATIVE ng/mL

## 2024-09-22 LAB — DRUG TOX ALC METAB W/CON, ORAL FLD: Alcohol Metabolite: NEGATIVE ng/mL

## 2024-09-23 ENCOUNTER — Other Ambulatory Visit: Payer: Self-pay | Admitting: Physical Medicine & Rehabilitation

## 2024-09-23 DIAGNOSIS — G894 Chronic pain syndrome: Secondary | ICD-10-CM

## 2024-09-23 DIAGNOSIS — G8929 Other chronic pain: Secondary | ICD-10-CM

## 2024-09-23 MED ORDER — HYDROCODONE-ACETAMINOPHEN 7.5-325 MG PO TABS: 1.0000 | ORAL_TABLET | Freq: Two times a day (BID) | ORAL | 0 refills | Status: AC

## 2024-09-24 ENCOUNTER — Encounter: Admitting: Rheumatology

## 2024-09-24 DIAGNOSIS — D508 Other iron deficiency anemias: Secondary | ICD-10-CM

## 2024-09-24 DIAGNOSIS — E8809 Other disorders of plasma-protein metabolism, not elsewhere classified: Secondary | ICD-10-CM

## 2024-09-24 DIAGNOSIS — M7989 Other specified soft tissue disorders: Secondary | ICD-10-CM

## 2024-09-24 DIAGNOSIS — R5383 Other fatigue: Secondary | ICD-10-CM

## 2024-09-24 DIAGNOSIS — Z96651 Presence of right artificial knee joint: Secondary | ICD-10-CM

## 2024-09-24 DIAGNOSIS — Z8711 Personal history of peptic ulcer disease: Secondary | ICD-10-CM

## 2024-09-24 DIAGNOSIS — R700 Elevated erythrocyte sedimentation rate: Secondary | ICD-10-CM

## 2024-09-24 DIAGNOSIS — I480 Paroxysmal atrial fibrillation: Secondary | ICD-10-CM

## 2024-09-24 DIAGNOSIS — G8929 Other chronic pain: Secondary | ICD-10-CM

## 2024-09-24 DIAGNOSIS — M2550 Pain in unspecified joint: Secondary | ICD-10-CM

## 2024-09-27 ENCOUNTER — Telehealth: Payer: Self-pay

## 2024-09-27 NOTE — Telephone Encounter (Signed)
 Patient Name: [name] Patient DOB: July 23, 1971 Patient ID: [id number] Status of Request: Approve Medication Name: Hydroco/Apap Tab 7.5-325 GPI/NDC: [id number] Decision Notes: HYDROCO/APAP TAB 7.5-325, use as directed, is approved for quantity limit exception through  10/11/2024.

## 2024-09-27 NOTE — Progress Notes (Unsigned)
"  ° °  Virtual Visit via Telephone Note   Because of [name] co-morbid illnesses, she is at least at moderate risk for complications without adequate follow up.  This format is felt to be most appropriate for this patient at this time.  Due to technical limitations with video connection (technology), today's appointment will be conducted as an audio only telehealth visit, and [name] verbally agreed to proceed in this manner.   All issues noted in this document were discussed and addressed.  No physical exam could be performed with this format.  Evaluation Performed:  Preoperative cardiovascular risk assessment _____________   Date:  09/28/2024   Patient ID:  [name], DOB 1971/03/08, MRN [id number]  Patient Location:  Provider location:  Home Office   Primary Care Provider:  Joesph Annabella HERO, FNP Primary Cardiologist:  Tami Carrier, MD  Patient Profile  Paroxysmal atrial fibrillation  S/p DCCV 10/2021 TTE 02/20/24: EF 60-65, no RWMA, Gr 1 DD, NL RVSF, trivial MR Chest pain  ED visit 01/2024; neg hsTrop Monitor: runs of SVT >> Rx w beta-blocker  Iron  deficiency anemia  Lower ext edema  Likely related to venous insufficiency; poss some diastolic dysfunction   History of Present Illness    [name] is a 54 y.o. female who presents via audio/video conferencing for a telehealth visit today.   Pt was last seen in cardiology clinic on 07/16/24 by Dr. JINNY Tami.  At that time [name] was doing well.    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  Since her last visit, she has done well w/o chest pain, shortness of breath, syncope, orthopnea, palpitations.    Physical Exam  Vital Signs:  [name] does not have vital signs available for review today. Given telephonic nature of communication, physical exam is limited. AAOx3. NAD. Normal affect.  Speech and respirations are unlabored.  Assessment & Plan     Assessment & Plan Preoperative cardiovascular examination [name]'s perioperative risk of a major cardiac event is 0.4% according to the Revised Cardiac Risk Index (RCRI).  Therefore, she is at low risk for perioperative complications.   Her functional capacity is fair at 4.06 METs according to the Duke Activity Status Index (DASI). Recommendations: According to ACC/AHA guidelines, no further cardiovascular testing needed.  The patient may proceed to surgery at acceptable risk.      The patient was advised that if she develops new symptoms prior to surgery to contact our office to arrange for a follow-up visit, and she verbalized understanding.   A copy of this note will be routed to requesting surgeon.  Time:   Today, I have spent 4 minutes with the patient with telehealth technology discussing medical history, symptoms, and management plan.     Tami Ferrier, PA-C 09/28/2024, 2:08 PM  "

## 2024-09-27 NOTE — Telephone Encounter (Signed)
 [name] (Key: [id number]) PA Case ID #: [id number] Rx #: [id number] Need Help? Call us  at [phone] Outcome Approved today by [facility] 2017 NCPDP Request Reference Number: [id number]. HYDROCO/APAP TAB 7.5-325 is approved through 10/11/2024. Your patient may now fill this prescription and it will be covered. Effective Date: 09/27/2024 Authorization Expiration Date: 10/11/2024 Drug HYDROcodone -Acetaminophen  7.5-325MG  tablets ePA cloud logo Form [facility] Electronic Prior Authorization Form (2017 NCPDP) Original Claim Info 75 Age<20;New to [place] QL=6/Day,3DS,2Rx/60DAge>/=20;New to [place] QL=6/Day,7DS,2Rx/60D [place] Exp QL=12/Day,2Rx/60DMax 2 Fills in 54 DaysTotal MME 15.00MG  For RxLocal Coupon Price of: $19.60 submit to BIN: [id number] PCN: CP Group: [id number] --Service provided at no cost and no switch fee to the pharmacy--

## 2024-09-28 ENCOUNTER — Encounter: Payer: Self-pay | Admitting: Physician Assistant

## 2024-09-28 ENCOUNTER — Ambulatory Visit

## 2024-09-28 DIAGNOSIS — Z01810 Encounter for preprocedural cardiovascular examination: Secondary | ICD-10-CM

## 2024-09-28 NOTE — Telephone Encounter (Signed)
 Notes faxed to surgeon.  Glendia Ferrier, PA-C  09/28/2024 2:10 PM

## 2024-10-08 ENCOUNTER — Ambulatory Visit: Admitting: Family Medicine

## 2024-10-15 ENCOUNTER — Ambulatory Visit: Admitting: Family Medicine

## 2024-10-22 ENCOUNTER — Ambulatory Visit: Admitting: Rheumatology
